# Patient Record
Sex: Male | Born: 1935 | Race: Black or African American | Hispanic: No | Smoking: Former smoker
Health system: Southern US, Community
[De-identification: ages and names within clinical notes are randomized; demographics above are authoritative.]

## PROBLEM LIST (undated history)

## (undated) DIAGNOSIS — I441 Atrioventricular block, second degree: Secondary | ICD-10-CM

## (undated) DIAGNOSIS — E785 Hyperlipidemia, unspecified: Secondary | ICD-10-CM

## (undated) DIAGNOSIS — H409 Unspecified glaucoma: Secondary | ICD-10-CM

## (undated) DIAGNOSIS — I2510 Atherosclerotic heart disease of native coronary artery without angina pectoris: Secondary | ICD-10-CM

## (undated) DIAGNOSIS — G309 Alzheimer's disease, unspecified: Secondary | ICD-10-CM

## (undated) DIAGNOSIS — M1990 Unspecified osteoarthritis, unspecified site: Secondary | ICD-10-CM

## (undated) DIAGNOSIS — N401 Enlarged prostate with lower urinary tract symptoms: Secondary | ICD-10-CM

## (undated) DIAGNOSIS — Z950 Presence of cardiac pacemaker: Secondary | ICD-10-CM

## (undated) DIAGNOSIS — R55 Syncope and collapse: Secondary | ICD-10-CM

## (undated) DIAGNOSIS — F0280 Dementia in other diseases classified elsewhere without behavioral disturbance: Secondary | ICD-10-CM

## (undated) DIAGNOSIS — I10 Essential (primary) hypertension: Secondary | ICD-10-CM

## (undated) HISTORY — PX: BRAIN SURGERY: SHX531

## (undated) HISTORY — DX: Atherosclerotic heart disease of native coronary artery without angina pectoris: I25.10

## (undated) HISTORY — PX: CATARACT EXTRACTION W/ INTRAOCULAR LENS  IMPLANT, BILATERAL: SHX1307

## (undated) HISTORY — DX: Unspecified osteoarthritis, unspecified site: M19.90

## (undated) HISTORY — DX: Essential (primary) hypertension: I10

## (undated) HISTORY — DX: Benign prostatic hyperplasia with lower urinary tract symptoms: N40.1

## (undated) HISTORY — DX: Hyperlipidemia, unspecified: E78.5

## (undated) HISTORY — DX: Syncope and collapse: R55

## (undated) HISTORY — DX: Atrioventricular block, second degree: I44.1

---

## 1994-11-09 ENCOUNTER — Encounter: Payer: Self-pay | Admitting: Family Medicine

## 1999-08-26 ENCOUNTER — Ambulatory Visit (HOSPITAL_COMMUNITY): Admission: RE | Admit: 1999-08-26 | Discharge: 1999-08-26 | Payer: Self-pay

## 1999-08-26 ENCOUNTER — Emergency Department (HOSPITAL_COMMUNITY): Admission: EM | Admit: 1999-08-26 | Discharge: 1999-08-26 | Payer: Self-pay

## 2000-08-10 ENCOUNTER — Emergency Department (HOSPITAL_COMMUNITY): Admission: EM | Admit: 2000-08-10 | Discharge: 2000-08-11 | Payer: Self-pay | Admitting: Emergency Medicine

## 2000-08-11 ENCOUNTER — Encounter: Payer: Self-pay | Admitting: Emergency Medicine

## 2002-03-21 ENCOUNTER — Inpatient Hospital Stay (HOSPITAL_COMMUNITY): Admission: EM | Admit: 2002-03-21 | Discharge: 2002-03-24 | Payer: Self-pay | Admitting: *Deleted

## 2003-01-25 HISTORY — PX: CORONARY ANGIOPLASTY: SHX604

## 2003-02-13 ENCOUNTER — Ambulatory Visit (HOSPITAL_COMMUNITY): Admission: RE | Admit: 2003-02-13 | Discharge: 2003-02-14 | Payer: Self-pay | Admitting: Cardiology

## 2003-02-23 HISTORY — PX: ANTERIOR CERVICAL DECOMP/DISCECTOMY FUSION: SHX1161

## 2003-02-26 ENCOUNTER — Emergency Department (HOSPITAL_COMMUNITY): Admission: EM | Admit: 2003-02-26 | Discharge: 2003-02-26 | Payer: Self-pay | Admitting: Emergency Medicine

## 2003-02-26 ENCOUNTER — Encounter: Admission: RE | Admit: 2003-02-26 | Discharge: 2003-02-26 | Payer: Self-pay | Admitting: Family Medicine

## 2003-02-26 ENCOUNTER — Encounter: Payer: Self-pay | Admitting: Family Medicine

## 2003-03-02 ENCOUNTER — Encounter: Admission: RE | Admit: 2003-03-02 | Discharge: 2003-03-02 | Payer: Self-pay | Admitting: Family Medicine

## 2003-03-02 ENCOUNTER — Encounter: Payer: Self-pay | Admitting: Family Medicine

## 2003-03-18 ENCOUNTER — Encounter: Payer: Self-pay | Admitting: Neurosurgery

## 2003-03-20 ENCOUNTER — Inpatient Hospital Stay (HOSPITAL_COMMUNITY): Admission: RE | Admit: 2003-03-20 | Discharge: 2003-03-21 | Payer: Self-pay | Admitting: Neurosurgery

## 2003-03-20 ENCOUNTER — Encounter: Payer: Self-pay | Admitting: Neurosurgery

## 2003-08-22 DIAGNOSIS — I2510 Atherosclerotic heart disease of native coronary artery without angina pectoris: Secondary | ICD-10-CM | POA: Insufficient documentation

## 2004-03-25 HISTORY — PX: INGUINAL HERNIA REPAIR: SUR1180

## 2004-03-31 ENCOUNTER — Encounter: Admission: RE | Admit: 2004-03-31 | Discharge: 2004-03-31 | Payer: Self-pay | Admitting: General Surgery

## 2004-04-04 ENCOUNTER — Ambulatory Visit (HOSPITAL_COMMUNITY): Admission: RE | Admit: 2004-04-04 | Discharge: 2004-04-04 | Payer: Self-pay | Admitting: General Surgery

## 2004-04-04 ENCOUNTER — Ambulatory Visit (HOSPITAL_BASED_OUTPATIENT_CLINIC_OR_DEPARTMENT_OTHER): Admission: RE | Admit: 2004-04-04 | Discharge: 2004-04-04 | Payer: Self-pay | Admitting: General Surgery

## 2004-12-25 LAB — HM COLONOSCOPY: HM Colonoscopy: NORMAL

## 2005-10-10 ENCOUNTER — Ambulatory Visit (HOSPITAL_COMMUNITY): Admission: RE | Admit: 2005-10-10 | Discharge: 2005-10-10 | Payer: Self-pay | Admitting: Gastroenterology

## 2005-10-10 ENCOUNTER — Encounter: Payer: Self-pay | Admitting: Family Medicine

## 2005-12-18 ENCOUNTER — Emergency Department (HOSPITAL_COMMUNITY): Admission: EM | Admit: 2005-12-18 | Discharge: 2005-12-18 | Payer: Self-pay | Admitting: Emergency Medicine

## 2007-03-18 ENCOUNTER — Encounter: Admission: RE | Admit: 2007-03-18 | Discharge: 2007-03-18 | Payer: Self-pay | Admitting: Family Medicine

## 2008-03-25 HISTORY — PX: PROSTATE BIOPSY: SHX241

## 2008-08-04 ENCOUNTER — Encounter: Payer: Self-pay | Admitting: Family Medicine

## 2008-08-04 HISTORY — PX: US ECHOCARDIOGRAPHY: HXRAD669

## 2008-08-04 HISTORY — PX: CARDIOVASCULAR STRESS TEST: SHX262

## 2009-02-11 ENCOUNTER — Encounter: Payer: Self-pay | Admitting: Family Medicine

## 2010-01-28 ENCOUNTER — Ambulatory Visit: Payer: Self-pay | Admitting: Family Medicine

## 2010-01-28 DIAGNOSIS — E785 Hyperlipidemia, unspecified: Secondary | ICD-10-CM

## 2010-01-28 DIAGNOSIS — I10 Essential (primary) hypertension: Secondary | ICD-10-CM

## 2010-01-28 DIAGNOSIS — N400 Enlarged prostate without lower urinary tract symptoms: Secondary | ICD-10-CM

## 2010-01-28 DIAGNOSIS — N138 Other obstructive and reflux uropathy: Secondary | ICD-10-CM

## 2010-01-28 DIAGNOSIS — N401 Enlarged prostate with lower urinary tract symptoms: Secondary | ICD-10-CM

## 2010-01-28 HISTORY — DX: Hyperlipidemia, unspecified: E78.5

## 2010-01-28 HISTORY — DX: Benign prostatic hyperplasia without lower urinary tract symptoms: N40.0

## 2010-01-28 HISTORY — DX: Other obstructive and reflux uropathy: N13.8

## 2010-01-28 HISTORY — DX: Benign prostatic hyperplasia with lower urinary tract symptoms: N40.1

## 2010-01-28 HISTORY — DX: Essential (primary) hypertension: I10

## 2010-01-28 LAB — CONVERTED CEMR LAB
Cholesterol, target level: 200 mg/dL
HDL goal, serum: 40 mg/dL
LDL Goal: 130 mg/dL

## 2010-01-31 LAB — CONVERTED CEMR LAB
ALT: 13 units/L (ref 0–53)
AST: 13 units/L (ref 0–37)
Albumin: 4 g/dL (ref 3.5–5.2)
Alkaline Phosphatase: 60 units/L (ref 39–117)
BUN: 15 mg/dL (ref 6–23)
Bilirubin, Direct: 0.1 mg/dL (ref 0.0–0.3)
CO2: 32 meq/L (ref 19–32)
Calcium: 9.1 mg/dL (ref 8.4–10.5)
Chloride: 105 meq/L (ref 96–112)
Cholesterol: 115 mg/dL (ref 0–200)
Creatinine, Ser: 1.3 mg/dL (ref 0.4–1.5)
GFR calc non Af Amer: 67.81 mL/min (ref >60)
Glucose, Bld: 98 mg/dL (ref 70–99)
HDL: 45.2 mg/dL (ref >39.00)
LDL Cholesterol: 59 mg/dL (ref 0–99)
Potassium: 4.3 meq/L (ref 3.5–5.1)
Sodium: 142 meq/L (ref 135–145)
Total Bilirubin: 1 mg/dL (ref 0.3–1.2)
Total CHOL/HDL Ratio: 3
Total Protein: 7.2 g/dL (ref 6.0–8.3)
Triglycerides: 52 mg/dL (ref 0.0–149.0)
VLDL: 10.4 mg/dL (ref 0.0–40.0)

## 2011-01-12 ENCOUNTER — Ambulatory Visit
Admission: RE | Admit: 2011-01-12 | Discharge: 2011-01-12 | Payer: Self-pay | Source: Home / Self Care | Attending: Family Medicine | Admitting: Family Medicine

## 2011-01-26 NOTE — Procedures (Signed)
Summary: Colonoscopy Report/[facility]  Colonoscopy Report/[facility]   Imported By: Maryln Gottron 02/01/2010 13:30:01  _____________________________________________________________________  External Attachment:    Type:   Image     Comment:   External Document

## 2011-01-26 NOTE — Assessment & Plan Note (Signed)
Summary: fu on htn/njr   Vital Signs:  Patient profile:   77 year old male Weight:      168 pounds Temp:     97.8 degrees F oral BP sitting:   118 / 70  (left arm) Cuff size:   regular  Vitals Entered By: Duard Brady LPN (January 12, 2011 9:52 AM) CC: f/u on BP - doing well, Hypertension Management, Lipid Management Is Patient Diabetic? No   History of Present Illness: Patient here for followup medical problems as below  Hypertension treated with benazepril and HCTZ. Patient compliant with therapy. No dizziness or headaches. No chest pain.  Hyperlipidemia treated with Vytorin. Cost issues. Will like to try generic. Previous intolerance to Lipitor with muscle aches. Lipids have been well controlled.  History of BPH. Symptoms controlled with doxazosin. No orthostasis. Good urine flow.  Hypertension History:      He denies headache, chest pain, palpitations, dyspnea with exertion, orthopnea, peripheral edema, visual symptoms, neurologic problems, syncope, and side effects from treatment.  He notes no problems with any antihypertensive medication side effects.        Positive major cardiovascular risk factors include male age 61 years old or older, hyperlipidemia, and hypertension.  Negative major cardiovascular risk factors include no history of diabetes, negative family history for ischemic heart disease, and non-tobacco-user status.        Positive history for target organ damage include ASHD (either angina/prior MI/prior CABG).  Further assessment for target organ damage reveals no history of stroke/TIA or peripheral vascular disease.    Lipid Management History:      Positive NCEP/ATP III risk factors include male age 23 years old or older, hypertension, and ASHD (either angina/prior MI/prior CABG).  Negative NCEP/ATP III risk factors include non-diabetic, no family history for ischemic heart disease, non-tobacco-user status, no prior stroke/TIA, no peripheral vascular disease,  and no history of aortic aneurysm.      Allergies (verified): No Known Drug Allergies  Past History:  Past Medical History: Last updated: 01/28/2010 arthritis glaucoma Hyperlipidemia Hypertension CAD, angioplasty 2004  Past Surgical History: Last updated: 01/28/2010 Angioplasty 2004 Cervical disk surgery, 2004 Hernia repair, 2006 prostate biopsy, 2009  Family History: Last updated: 01/28/2010 Heart disease, father deceased age 62, mother deceased age 63  Social History: Last updated: 01/28/2010 Retired Married Alcohol use past smoker, quit 1980  Risk Factors: Smoking Status: quit (01/28/2010) Packs/Day: 2.0 (01/28/2010) PMH-FH-SH reviewed for relevance  Review of Systems  The patient denies anorexia, fever, weight loss, weight gain, vision loss, decreased hearing, hoarseness, chest pain, syncope, dyspnea on exertion, peripheral edema, prolonged cough, headaches, hemoptysis, abdominal pain, melena, hematochezia, severe indigestion/heartburn, hematuria, incontinence, muscle weakness, transient blindness, difficulty walking, depression, unusual weight change, enlarged lymph nodes, and testicular masses.    Physical Exam  General:  Well-developed,well-nourished,in no acute distress; alert,appropriate and cooperative throughout examination Head:  normocephalic and atraumatic.   Ears:  External ear exam shows no significant lesions or deformities.  Otoscopic examination reveals clear canals, tympanic membranes are intact bilaterally without bulging, retraction, inflammation or discharge. Hearing is grossly normal bilaterally. Mouth:  Oral mucosa and oropharynx without lesions or exudates.  Teeth in good repair. Neck:  No deformities, masses, or tenderness noted. Lungs:  Normal respiratory effort, chest expands symmetrically. Lungs are clear to auscultation, no crackles or wheezes. Heart:  normal rate and regular rhythm.   Abdomen:  soft and non-tender.   Extremities:   No clubbing, cyanosis, edema, or deformity noted with normal full range  of motion of all joints.   Neurologic:  alert & oriented X3 and cranial nerves II-XII intact.     Impression & Recommendations:  Problem # 1:  HYPERLIPIDEMIA (ICD-272.4) change to generic simvastatin 20 mg daily and reassess lipids in 3 months His updated medication list for this problem includes:    Simvastatin 20 Mg Tabs (Simvastatin) ..... One by mouth q hs  Problem # 2:  HYPERTENSION (ICD-401.9)  His updated medication list for this problem includes:    Hydrochlorothiazide 12.5 Mg Tabs (Hydrochlorothiazide) ..... Once daily    Doxazosin Mesylate 4 Mg Tabs (Doxazosin mesylate) ..... Once daily    Benazepril Hcl 20 Mg Tabs (Benazepril hcl) ..... Once daily  Problem # 3:  HYPERTROPHY PROSTATE W/UR OBST & OTH LUTS (ICD-600.01)  Complete Medication List: 1)  Aspirin 325 Mg Tabs (Aspirin) .... Once daily 2)  Hydrochlorothiazide 12.5 Mg Tabs (Hydrochlorothiazide) .... Once daily 3)  Doxazosin Mesylate 4 Mg Tabs (Doxazosin mesylate) .... Once daily 4)  Benazepril Hcl 20 Mg Tabs (Benazepril hcl) .... Once daily 5)  Simvastatin 20 Mg Tabs (Simvastatin) .... One by mouth q hs  Hypertension Assessment/Plan:      The patient's hypertensive risk group is category C: Target organ damage and/or diabetes.  Today's blood pressure is 118/70.    Lipid Assessment/Plan:      Based on NCEP/ATP III, the patient's risk factor category is "history of coronary disease, peripheral vascular disease, cerebrovascular disease, or aortic aneurysm".  The patient's lipid goals are as follows: Total cholesterol goal is 200; LDL cholesterol goal is 130; HDL cholesterol goal is 40; Triglyceride goal is 150.    Patient Instructions: 1)  Please schedule a follow-up appointment in 3 months .  2)  BMP prior to visit, ICD-9: 401.9 3)  Hepatic Panel prior to visit ICD-9: 272.4 4)  Lipid panel prior to visit ICD-9 :  272.4 Prescriptions: SIMVASTATIN 20 MG TABS (SIMVASTATIN) one by mouth q hs  #90 x 3   Entered and Authorized by:   Evelena Peat MD   Signed by:   Evelena Peat MD on 01/12/2011   Method used:   Electronically to        [facility]  [phone]* (retail)       [address] [place], [place]  [address]       Ph: [phone] or [phone]       Fax: [phone]   RxID:   [id number] BENAZEPRIL HCL 20 MG TABS (BENAZEPRIL HCL) once daily  #90 x 3   Entered and Authorized by:   Evelena Peat MD   Signed by:   Evelena Peat MD on 01/12/2011   Method used:   Electronically to        [facility]  [phone]* (retail)       [address] [place], [place]  [address]       Ph: [phone] or [phone]       Fax: [phone]   RxID:   [id number] DOXAZOSIN MESYLATE 4 MG TABS (DOXAZOSIN MESYLATE) once daily  #90 x 3   Entered and Authorized by:   Evelena Peat MD   Signed by:   Evelena Peat MD on 01/12/2011   Method used:   [facility]  [address]  [phone]* (retail)       [address] [place],  [place]  [address]       Ph: [phone] or [phone]       Fax: [phone]   RxID:   [id number] HYDROCHLOROTHIAZIDE 12.5 MG TABS (HYDROCHLOROTHIAZIDE) once daily  #90 x 3   Entered and Authorized by:   Evelena Peat MD   Signed by:   Evelena Peat MD on 01/12/2011   Method used:   [facility]ronically to        [facility]  [phone]* (retail)       [address] [place], [place]  [address]       Ph: [phone] or [phone]       Fax: [phone]   RxID:   [id number]    Orders Added: 1)  Est. Patient Level IV [[address]]

## 2011-01-26 NOTE — Assessment & Plan Note (Signed)
Summary: NEW PT/TRAN[facility] [place]/[facility]   Vital Signs:  Patient profile:   77 year old male Height:      64.25 inches Weight:      165 pounds BMI:     28.20 Temp:     97.7 degrees F oral Pulse rate:   72 / minute Pulse rhythm:   regular Resp:     12 per minute BP sitting:   140 / 70  (left arm) Cuff size:   regular  Vitals Entered By: Sid Falcon LPN (January 28, 2010 9:12 AM) CC: New to establish, pt fasting, med refills, Hypertension Management, Lipid Management Is Patient Diabetic? No   History of Present Illness: New patient to establish care.  Past medical history assessment for coronary artery disease, hypertension, hyperlipidemia, and BPH. Prior elevated PSA with negative biopsy 2009.  Denies any significant nocturia or obstructive urinary symptoms.Angioplasty 2004. No recent chest pains.  Medications reviewed. Compliant with all medications. Denies side effects. No lab work in the past year.  Recent osteoarthritis pains left knee. Saw orthopedist and received corticosteroid injection which has helped some.  Hypertension History:      He denies headache, chest pain, palpitations, dyspnea with exertion, orthopnea, PND, peripheral edema, visual symptoms, syncope, and side effects from treatment.        Positive major cardiovascular risk factors include male age 2 years old or older, hyperlipidemia, and hypertension.  Negative major cardiovascular risk factors include no history of diabetes, negative family history for ischemic heart disease, and non-tobacco-user status.        Positive history for target organ damage include ASHD (either angina/prior MI/prior CABG).  Further assessment for target organ damage reveals no history of stroke/TIA or peripheral vascular disease.    Lipid Management History:      Positive NCEP/ATP III risk factors include male age 70 years old or older, hypertension, and ASHD (either angina/prior MI/prior CABG).  Negative NCEP/ATP III risk  factors include non-diabetic, no family history for ischemic heart disease, non-tobacco-user status, no prior stroke/TIA, no peripheral vascular disease, and no history of aortic aneurysm.      Preventive Screening-Counseling & Management  Alcohol-Tobacco     Smoking Status: quit     Packs/Day: 2.0     Year Started: 1940     Year Quit: 1980  Allergies (verified): No Known Drug Allergies  Past History:  Family History: Last updated: 01/28/2010 Heart disease, father deceased age 49, mother deceased age 66  Social History: Last updated: 01/28/2010 Retired Married Alcohol use past smoker, quit 1980  Risk Factors: Smoking Status: quit (01/28/2010) Packs/Day: 2.0 (01/28/2010)  Past Medical History: arthritis glaucoma Hyperlipidemia Hypertension CAD, angioplasty 2004  Past Surgical History: Angioplasty 2004 Cervical disk surgery, 2004 Hernia repair, 2006 prostate biopsy, 2009 PMH-FH-SH reviewed for relevance  Family History: Heart disease, father deceased age 85, mother deceased age 41  Social History: Retired Married Alcohol use past smoker, quit 1980 Smoking Status:  quit Packs/Day:  2.0  Review of Systems  The patient denies anorexia, fever, weight loss, weight gain, chest pain, syncope, dyspnea on exertion, peripheral edema, prolonged cough, headaches, hemoptysis, abdominal pain, melena, hematochezia, severe indigestion/heartburn, incontinence, muscle weakness, and depression.    Physical Exam  General:  Well-developed,well-nourished,in no acute distress; alert,appropriate and cooperative throughout examination Ears:  External ear exam shows no significant lesions or deformities.  Otoscopic examination reveals clear canals, tympanic membranes are intact bilaterally without bulging, retraction, inflammation or discharge. Hearing is grossly normal bilaterally. Mouth:  Oral  mucosa and oropharynx without lesions or exudates.  Teeth in good repair. Neck:  No  deformities, masses, or tenderness noted. Lungs:  Normal respiratory effort, chest expands symmetrically. Lungs are clear to auscultation, no crackles or wheezes. Heart:  normal rate, regular rhythm, and no gallop.   Abdomen:  soft, non-tender, normal bowel sounds, no distention, no masses, no hepatomegaly, and no splenomegaly.   Extremities:  moderate crepitus left knee with flexion and extension. No effusion Neurologic:  alert & oriented X3, cranial nerves II-XII intact, strength normal in all extremities, and gait normal.   Psych:  good eye contact, not anxious appearing, and not depressed appearing.     Impression & Recommendations:  Problem # 1:  HYPERTENSION (ICD-401.9)  His updated medication list for this problem includes:    Hydrochlorothiazide 12.5 Mg Tabs (Hydrochlorothiazide) ..... Once daily    Doxazosin Mesylate 4 Mg Tabs (Doxazosin mesylate) ..... Once daily    Benazepril Hcl 20 Mg Tabs (Benazepril hcl) ..... Once daily  Orders: TLB-BMP (Basic Metabolic Panel-BMET) (80048-METABOL)  Problem # 2:  HYPERLIPIDEMIA (ICD-272.4)  His updated medication list for this problem includes:    Vytorin 10-20 Mg Tabs (Ezetimibe-simvastatin) ..... Once daily  Orders: Venipuncture ([address]) TLB-Lipid Panel (80061-LIPID) TLB-Hepatic/Liver Function Pnl (80076-HEPATIC)  Problem # 3:  HYPERTROPHY PROSTATE W/UR OBST & OTH LUTS (ICD-600.01)  Complete Medication List: 1)  Aspirin 325 Mg Tabs (Aspirin) .... Once daily 2)  Hydrochlorothiazide 12.5 Mg Tabs (Hydrochlorothiazide) .... Once daily 3)  Doxazosin Mesylate 4 Mg Tabs (Doxazosin mesylate) .... Once daily 4)  Benazepril Hcl 20 Mg Tabs (Benazepril hcl) .... Once daily 5)  Vytorin 10-20 Mg Tabs (Ezetimibe-simvastatin) .... Once daily  Hypertension Assessment/Plan:      The patient's hypertensive risk group is category C: Target organ damage and/or diabetes.  Today's blood pressure is 140/70.    Lipid Assessment/Plan:      Based on  NCEP/ATP III, the patient's risk factor category is "history of coronary disease, peripheral vascular disease, cerebrovascular disease, or aortic aneurysm".  The patient's lipid goals are as follows: Total cholesterol goal is 200; LDL cholesterol goal is 130; HDL cholesterol goal is 40; Triglyceride goal is 150.    Patient Instructions: 1)  Please schedule a follow-up appointment in 1 year.  Prescriptions: VYTORIN 10-20 MG TABS (EZETIMIBE-SIMVASTATIN) once daily  #30 x 11   Entered and Authorized by:   Evelena Peat MD   Signed by:   Evelena Peat MD on 01/28/2010   Method used:   Electronically to        [facility]. [phone]* (retail)       [phone] [facility]       [place], [place]  [address]       Ph: [phone]       Fax: [phone]   RxID:   [id number] BENAZEPRIL HCL 20 MG TABS (BENAZEPRIL HCL) once daily  #30 x 11   Entered and Authorized by:   Evelena Peat MD   Signed by:   Evelena Peat MD on 01/28/2010   Method used:   Electronically to        [facility]. [phone]* (retail)       [phone] [facility]       [place], [place]  [address]       Ph: [phone]       Fax: [phone]   RxID:  [id number] DOXAZOSIN MESYLATE 4 MG TABS (DOXAZOSIN MESYLATE) once daily  #30 x 11   Entered and Authorized by:   Evelena Peat MD   Signed by:   Evelena Peat MD on 01/28/2010   Method used:   Electronically to        [facility]. [phone]* (retail)       [phone] [facility].       [place], [place]  [address]       Ph: [phone]       Fax: [phone]   RxID:   [id number] HYDROCHLOROTHIAZIDE 12.5 MG TABS (HYDROCHLOROTHIAZIDE) once daily  #30 x 11   Entered and Authorized by:   Evelena Peat MD   Signed by:   Evelena Peat MD on 01/28/2010   Method used:   Electronically to        [facility] [phone]* (retail)       [phone] [facility].        [place], [place]  [address]       Ph: [phone]       Fax: [phone]   RxID:   [id number]   Preventive Care Screening  Colonoscopy:    Date:  12/25/2004    Results:  normal

## 2011-01-26 NOTE — Letter (Signed)
Summary: [facility]   Imported By: Maryln Gottron 02/01/2010 13:23:21  _____________________________________________________________________  External Attachment:    Type:   Image     Comment:   External Document

## 2011-04-06 ENCOUNTER — Other Ambulatory Visit (INDEPENDENT_AMBULATORY_CARE_PROVIDER_SITE_OTHER): Payer: Medicare Other | Admitting: Family Medicine

## 2011-04-06 DIAGNOSIS — I10 Essential (primary) hypertension: Secondary | ICD-10-CM

## 2011-04-06 DIAGNOSIS — E785 Hyperlipidemia, unspecified: Secondary | ICD-10-CM

## 2011-04-06 LAB — LIPID PANEL
Cholesterol: 149 mg/dL (ref 0–200)
HDL: 41.7 mg/dL (ref >39.00)
LDL Cholesterol: 96 mg/dL (ref 0–99)
Total CHOL/HDL Ratio: 4
Triglycerides: 59 mg/dL (ref 0.0–149.0)
VLDL: 11.8 mg/dL (ref 0.0–40.0)

## 2011-04-06 LAB — BASIC METABOLIC PANEL
Chloride: 102 mEq/L (ref 96–112)
Creatinine, Ser: 1.3 mg/dL (ref 0.4–1.5)
GFR: 65.86 mL/min (ref >60.00)

## 2011-04-06 LAB — HEPATIC FUNCTION PANEL
ALT: 10 U/L (ref 0–53)
Bilirubin, Direct: 0.2 mg/dL (ref 0.0–0.3)
Total Bilirubin: 0.7 mg/dL (ref 0.3–1.2)

## 2011-04-07 NOTE — Progress Notes (Signed)
Pt aware.

## 2011-04-12 ENCOUNTER — Encounter: Payer: Self-pay | Admitting: Family Medicine

## 2011-04-13 ENCOUNTER — Ambulatory Visit (INDEPENDENT_AMBULATORY_CARE_PROVIDER_SITE_OTHER): Payer: Medicare Other | Admitting: Family Medicine

## 2011-04-13 ENCOUNTER — Encounter: Payer: Self-pay | Admitting: Family Medicine

## 2011-04-13 DIAGNOSIS — E785 Hyperlipidemia, unspecified: Secondary | ICD-10-CM

## 2011-04-13 DIAGNOSIS — N401 Enlarged prostate with lower urinary tract symptoms: Secondary | ICD-10-CM

## 2011-04-13 DIAGNOSIS — I10 Essential (primary) hypertension: Secondary | ICD-10-CM

## 2011-04-13 NOTE — Progress Notes (Signed)
  Subjective:    Patient ID: Joshua Burns, male    DOB: 1925-02-21, 77 y.o.   MRN: [id number]  HPI Patient seen for medical followup. Recent labs reviewed. He has hyperlipidemia. We switched from Vytorin to simvastatin. Tolerating well.  History of CAD. He has no side effects from medication. Compliant with all medications.  Hypertension treated with benazepril. No orthostasis. No cough. Blood pressure is well-controlled.  History of BPH.  Takes Cardura. Occasional nocturia. No progressive obstructive symptoms. No orthostatic symptoms with Cardura.  Past Medical History  Diagnosis Date  . HYPERLIPIDEMIA 01/28/2010  . HYPERTENSION 01/28/2010  . HYPERTROPHY PROSTATE W/UR OBST & OTH LUTS 01/28/2010  . CAD (coronary artery disease)   . Arthritis   . Glaucoma    Past Surgical History  Procedure Date  . Angioplasty 2004  . Cervical disc surgery 2004  . Hernia repair 2006  . Prostate surgery 2009    bx    reports that he quit smoking about 32 years ago. He does not have any smokeless tobacco history on file. He reports that he drinks alcohol. His drug history not on file. family history includes Heart disease in his father and mother. No Known Allergies    Review of Systems  Constitutional: Negative for fatigue.  Eyes: Negative for visual disturbance.  Respiratory: Negative for cough, chest tightness and shortness of breath.   Cardiovascular: Negative for chest pain, palpitations and leg swelling.  Neurological: Negative for dizziness, syncope, weakness, light-headedness and headaches.       Objective:   Physical Exam  Constitutional: He is oriented to person, place, and time. He appears well-developed and well-nourished. No distress.  HENT:  Head: Normocephalic and atraumatic.  Right Ear: External ear normal.  Left Ear: External ear normal.  Mouth/Throat: Oropharynx is clear and moist.  Eyes: Conjunctivae and EOM are normal. Pupils are equal, round, and reactive to light.    Neck: Normal range of motion. Neck supple. No thyromegaly present.  Cardiovascular: Normal rate, regular rhythm and normal heart sounds.   No murmur heard. Pulmonary/Chest: No respiratory distress. He has no wheezes. He has no rales.  Abdominal: Soft. Bowel sounds are normal. He exhibits no distension and no mass. There is no tenderness. There is no rebound and no guarding.  Musculoskeletal: He exhibits no edema.  Lymphadenopathy:    He has no cervical adenopathy.  Neurological: He is alert and oriented to person, place, and time. He displays normal reflexes. No cranial nerve deficit.  Skin: No rash noted.  Psychiatric: He has a normal mood and affect.          Assessment & Plan:  #1 hypertension stable. Continue current medication. Recent electrolytes and creatinine stable #2 hyperlipidemia. Lipids are due to his patient. At goal. Continue Zocor 20 mg daily #3 BPH. Symptomatically stable. Continue Cardura 4 mg each bedtime which he has been on for several years

## 2011-05-12 NOTE — Discharge Summary (Signed)
[facility]  Patient:    Joshua Burns, Joshua Burns Visit Number: [id number] MRN: [id number]          Service Type: MED Location: 3W [id number] Attending Physician:  Joshua Burns Dictated by:   Joshua Burns, M.D. Admit Date:  03/21/2002 Discharge Date: 03/24/2002   CC:         Joshua Burns. Joshua Burns, M.D.   Discharge Summary  DATE OF BIRTH:  [birth year]  DISCHARGE DIAGNOSES:  1. Syncopal episode.     a. Suspect dehydration with orthostasis and possibly vasovagal with        nausea.  2. Viral bronchitis.  3. Mild thrombocytopenia.     a. Admission platelet count 126, discharge 142.  4. Mildly elevated sedimentation rate at 37.     a. Significance unclear.  5. Greater than 20% bands on initial peripheral blood smear.     a. Afebrile.     b. Unable to obtain repeat, technical difficulties with the laboratory        coordination.  6. Dehydration/hypokalemia, resolved.     a. Recent viral illness.     b. Started on hydrochlorothiazide one week ago.  7. Variable blood pressure with a history of hypertension.     a. Recently added hydrochlorothiazide.     b. Decrease medications at the time of discharge.  8. Chronic back pain secondary to degenerative disc disease.     a. MRI in 9/00.  9. Hypercholesterolemia. 10. Hyperglycemia on admission.     a. Fasting CBG followup was 103. 11. Recent history of muscle weakness and fatigue.     a. Zocor stopped one week prior to admission.  DISCHARGE MEDICATIONS: 1. (New) Lotensin 5 mg q.a.m. 2. (New) Combivent metered dose inhaler with spacer 2 puffs q.i.d. 3. (New) Humibid DM one p.o. b.i.d. x2 weeks.  * Do not take Lotrel, hydrochlorothiazide, K-Dur, Cardura.  CONDITION ON DISCHARGE:  Stable.  Blood pressure 123/64, heart rate 61.  DISPOSITION:  Home.  RECOMMENDED ACTIVITY:  As tolerated.  RECOMMENDED DIET:  As tolerated.  DISCHARGE INSTRUCTIONS:  Call or return if you have problems.  FOLLOWUPDerl Burns, Dr. Caryl Burns, Thursday, March 27, 2002, at 11 a.m.  Dr. Arlyce Burns will be out of town.  At that visit they should check a basic metabolic panel, CBC with differential and smear, blood pressure, and followup 2-D echocardiogram results on the day of discharge.  PROCEDURES: 1. Head CT (03/21/02):  Mild atrophy, no acute disease. 2. Chest x-ray (03/21/02):  Peribronchial thickening is stable.  Little    interval change since x-ray on August 11, 2000.  Mild pulmonary vascular    congestion ??. 3. MRI/MRA head (03/23/02):  No acute infarct.  Mild paranasal sinus disease.    ______ minimally prominent in size.  Atrophy.  MRA shows mild intracranial    atherosclerotic type changes. 4. Carotid Dopplers:  No evidence of significant plaque bilaterally. 5. A 2-D echocardiogram pending at the time of discharge.  HOSPITAL COURSE:  #1 - SYNCOPE:  [name] is a 77 year old gentleman with a history of high cholesterol and hypertension who presents with syncope.  He had been feeling poorly for the past week with a cough and possibly a viral syndrome.  About one week ago he was in the office and Dr. Arlyce Burns added hydrochlorothiazide to his medications.  On the morning of the episode he was sitting at the table and developed some nausea.  He went to sit down on the couch.  He started to feel somewhat light-headed and his vision closed in on him, and his wife witnessed him passing out.  There was no witnessed seizure activity. Unfortunately, there were no EMS records placed in the chart, so I have no idea what his blood pressure was at the time EMS arrived.  In the emergency room, his systolic blood pressure was around 100.  Orthostatics were not done on admission either.  Overnight he received IV fluids.  The next morning he was feeling fine with no symptoms.  Later that afternoon he did develop some presyncopal type symptoms.  His blood pressure was 130 at that time.  There was no abnormality on  the telemetry.  He then told me he had experienced double vision a couple weeks earlier.  I ordered an MRI/MRA which showed no significant blockages and no evidence of acute stroke.  On Monday morning we were able to obtain carotid Dopplers which showed no evidence of significant stenosis.  A 2-D echocardiogram was done, but I feel it is safe to let him go home.  My guess is that he was volume depleted secondary to the recent administration of a diuretic, as well as the recent viral syndrome and bronchitis.  He will follow up with his regular physicians this week.  #2 - PROBABLE BRONCHITIS AND VIRAL SYNDROME:  I do not see the final report of the MRI indicating possible sinus disease.  This may be contributing to his cough.  Recommend evaluation as an outpatient, possible starting of Nasonex and antibiotics if he is still experiencing symptoms.  I did send him out on a Combivent metered dose inhaler.  #3 - MILDLY ELEVATED SEDIMENTATION RATE:  This was 37.  I got this because he had described some proximal weakness with mowing the lawn.  There was no associated chest pain, shortness of breath, or light-headedness.  This occurred about 1-1/2 weeks ago.  His regular doctor had stopped his Zocor.  He has no temporal headaches or unilateral vision changes or temporal artery tenderness.  With the sedimentation rate of 37, I doubt that he has symptomatology of polymyalgia rheumatica.  #4 - ABNORMAL BLOOD SMEAR:  Interestingly, his initial white blood cell count was 5400.  Ultimately, the peripheral smear came back showing greater than 20% bands.  I suspect this was an error because he did not even have a left shift on his differential.  I tried to repeat it on March 23, 2002, but the lab somehow got the signals crossed and did not do my peripheral smear.  Again, the white count was 5900 with a normal differential.  By the time I was able to track this down it was the following day, and they  were unable to do the  test.  I did not feel it was worthwhile to stick the patient again to obtain this at this time.  He had no fevers and no hypotension or any other indication of sepsis.  #5 - DEHYDRATION/HYPOKALEMIA:  Secondary to poor intake and diuretic.  We hydrated him when he got here and I stopped his diuretic.  #6 - HYPERTENSION:  Blood pressure has been variable.  On the morning of March 23, 2002, it was 111/63.  On the morning of March 24, 2002, it was 123/64.  I had started him on Lotensin 5 mg q.d.  He will continue to take this and follow up at [facility].  #7 - CARDIAC RISK FACTORS:  Mr. Masden does have a history of  hypertension and hyperlipidemia.  I did do a 2-D echocardiogram.  I have not placed him on an aspirin empirically, but this would be reasonable, and I would certainly need to follow up his 2-D echocardiogram report.  DISCHARGE LABORATORY DATA:  Sodium 137, potassium 3.9, chloride 101, bicarbonate 30, BUN 20, creatinine 1.3, glucose 111.  Hemoglobin 12.6, MCV 80, white blood cell count 5900, platelet count 142.  Sedimentation rate 37. Phosphorus 2.4.  Serial cardiac enzymes negative.  I spent greater than 30 minutes arranging discharge and dictating. Dictated by:   Joshua Burns, M.D. Attending Physician:  Joshua Burns DD:  03/24/02 TD:  03/25/02 Job: [id number]

## 2011-05-12 NOTE — Op Note (Signed)
NAMEKEWON, STATLER                           ACCOUNT NO.:  [id number]   MEDICAL RECORD NO.:  [id number]                   PATIENT TYPE:  AMB   LOCATION:  DSC                                  FACILITY:  [facility]   PHYSICIAN:  Leonie Man, M.D.                DATE OF BIRTH:  03/03/1925   DATE OF PROCEDURE:  04/04/2004  DATE OF DISCHARGE:                                 OPERATIVE REPORT   PREOPERATIVE DIAGNOSIS:  Right inguinal hernia.   POSTOPERATIVE DIAGNOSIS:  Right inguinal hernia.   OPERATION PERFORMED:  Repair, right inguinal hernia with mesh.   SURGEON:  Leonie Man, M.D.   ASSISTANT:  Nurse.   ANESTHESIA:  General.   INDICATIONS FOR PROCEDURE:  Mr. Macchia is a 77 year old retired gentleman  with a right-sided groin bulge.  This has been causing some mild discomfort.  He was referred for evaluation and treatment.  On examination, he was noted  to have a right-sided inguinal hernia.  The risks and potential benefits of  repair of a right inguinal hernia has been discussed with the patient.  All  questions answered and consent obtained.   DESCRIPTION OF PROCEDURE:  Following the induction of satisfactory general  anesthesia, the patient was positioned supinely.  The right groin was  prepped and draped to be included in the sterile operative field.  I  infiltrated in the region of the right groin crease with 0.5% Marcaine with  epinephrine 1:200,000.  A transverse incision was made in the lower groin  crease and deepened through the skin and subcutaneous tissue, dissecting  down to the external oblique aponeurosis.  This was opened up through the  external inguinal ring with retraction of the ilioinguinal nerve cephalad  and medially.  The spermatic cord was elevated and held with a Penrose  drain.  A large indirect hernia was noted to be securely attached to the  spermatic cord.  This was dissected free and reduced into the  retroperitoneum.  Spermatic cord explored.   A very large lipoma was removed.  There were no indirect hernias noted.  The direct space was then repaired  with a patch of polypropylene mesh sewn in at the pubic tubercle and  continued up along the shelving edge of Poupart's ligament to the internal  ring with a 2-0 Novofil suture and again from the pubic tubercle along the  conjoined tendon to the internal ring with another 2-0 Novofil suture.  The  mesh was split to allow the cord to emanate between them.  The leaves of the  mesh were then trimmed and then sewn down into the internal oblique muscles  behind the cord.  The cord was free with no vascular compromise.  Sponge,  instrument and sharp counts were then verified.  All areas of dissection  checked for hemostasis and noted to be dry.  Wound closed in layers as  follows:  External  oblique aponeurosis closed over the cord with a running 2-  0 Vicryl suture.  The Scarpa's fascia closed with a running 3-0 Vicryl  suture and the skin closed with a running 4-0 Monocryl suture placed  intradermally.  Sterile dressings were then applied after Steri-Strips had  been applied to the wound.  Anesthetic was reversed and the patient removed  from the operating room to the recovery room in stable condition, having  tolerated the procedure well.                                              Leonie Man, M.D.   PB/MEDQ  D:  04/04/2004  T:  04/04/2004  Job:  [id number]

## 2011-05-12 NOTE — Cardiovascular Report (Signed)
NAMEAMARU, Burns NO.:  [id number]   MEDICAL RECORD NO.:  [id number]                   PATIENT TYPE:  OIB   LOCATION:  2869                                 FACILITY:  [facility]   PHYSICIAN:  Peter M. Swaziland, M.D.               DATE OF BIRTH:  Aug 21, 1925   DATE OF PROCEDURE:  02/13/2003  DATE OF DISCHARGE:                              CARDIAC CATHETERIZATION   PROCEDURE:  1. Cardiac catheterization.  2. Angioplasty.   INDICATIONS FOR PROCEDURE:  The patient is a 77 year old black male with a  history of hypertension and hyperlipidemia who presents with symptoms of  atypical chest pain.  Exercise stress test is positive for ischemia.   Access via the right femoral using the standard Seldinger technique, with  equipment, 6-French 4 cm right and left Judkins catheters, 6-French pigtail  catheter, 6-French arterial sheath, 7-French arterial sheath, and 7-French  left Voda guide, 0.014 across an XT 200 wire, and a 2.5 x15 Mavik balloon.  Contrast 250 cc of Omnipaque contrast.   MEDICATIONS:  1. Heparin a total of 700 units.  ACT was 305 seconds at the start of the     procedure.  2. Nitroglycerin 200 micrograms intracoronary x1.   HEMODYNAMIC DATA:  Aortic pressure 148/63 with a mean of 97 mmHg.  Left ventricular pressure 150 with an EDP of 18 mmHg.   ANGIOGRAPHIC DATA:  The left coronary artery arises and distributes  normally.  The left main coronary is very large and long without significant  disease.   Left anterior descending artery has minor wall irregularities, less than  10%.  There is a large intermediate branch which has only minor  irregularities, less than 10%.   Left circumflex coronary artery has a focal 90% stenosis in the proximal  vessel.   The right coronary artery arises and distributes normally and is a dominant  vessel.  It has minor irregularities, less than 10%.   Left ventricular angiography is performed in the RAO  and LAO cranial views.  This demonstrates normal left ventricular size with minimal lateral wall  hypokinesia.  Overall left ventricular systolic function is normal with an  ejection fraction of 60%.  There is no mitral regurgitation or prolapse.  The aortic valve appears normal.   We proceeded with angioplasty of the left circumflex lesion.  This lesion  proved to be very difficult to cross due to acute angulation of the  circumflex from the left main coronary artery, followed by further  tortuosity in the proximal left circumflex.  We were initially unable to  cross with either a high-torque floppy wire or a Choice PT wire.  Using a  200 XT wire, we were able to cross the lesion.  We then brought down the 2.5  x15 mm Mavik balloon, and performed two dilatations of 6 and then 10  atmospheres.  This yielded excellent angiographic result  with less than 10%  residual stenosis.  There was TIMI grade 3 flow, and no complications.  It  was felt that this lesions would be very difficult to stent given the  difficulty crossing the lesion with a balloon alone.  Since we did have an  excellent balloon angioplasty result, the procedure was stopped at this  point.   FINAL INTERPRETATION:  1. Single vessel obstructive atherosclerotic coronary artery disease.  2. Normal left ventricular function.  3. Successful balloon angioplasty of the proximal left circumflex coronary     artery.                                               Peter M. Swaziland, M.D.    PMJ/MEDQ  D:  02/13/2003  T:  02/14/2003  Job:  [id number]   cc:   Teena Irani. Arlyce Dice, M.D.  [address]  [place]  [place] [address]  Fax: [phone]

## 2011-05-12 NOTE — Discharge Summary (Signed)
NAME:  GLEASON, ARDOIN NO.:  [id number]   MEDICAL RECORD NO.:  [id number]                   PATIENT TYPE:  OIB   LOCATION:  6523                                 FACILITY:  [facility]   PHYSICIAN:  Peter M. Swaziland, M.D.               DATE OF BIRTH:  1925-08-15   DATE OF ADMISSION:  02/13/2003  DATE OF DISCHARGE:  02/14/2003                                 DISCHARGE SUMMARY   HISTORY OF PRESENT ILLNESS:  The patient is a 77 year old black male who  presents with symptoms of chest pain.  He had an abnormal stress test.  He  has a history of hypertension and hypercholesterolemia.  For details of his  past medical history, social history, family history, and physical exam,  please see the admission history and physical.   LABORATORY DATA:  Sodium 141, potassium 3.9, chloride 103, CO2 26, BUN 16,  creatinine 1.5, glucose 104, triglycerides 95, cholesterol 190, LDL 119, HDL  52.  Coagulation times were normal.  The white count was 5400, hemoglobin  13.1, hematocrit 38.8, platelets 157,000.  The resting ECG was normal.   HOSPITAL COURSE:  The patient underwent cardiac catheterization.  This  demonstrated severe 95% stenosis in the proximal left circumflex coronary  artery.  This was a focal lesion.  There was no other significant  obstructive disease.  Left ventricular function was normal.  The patient  underwent angioplasty of his lesion.  It was difficult to approach the  lesion due to acute angulation of the left circumflex from the left main  coronary artery and tortuosity of the proximal vessel.  However, we were  able to successfully angioplasty this using a 2.5 x 15 mm Maverick balloon  and achieved an excellent result of less than 10% residual stenosis.  The  patient tolerated the procedure well.  Post procedure, he was treated with  IV nitroglycerin overnight and then this was discontinued.  He had no groin  complications.  His follow-up ECG was  normal.   DISPOSITION:  The patient was discharged home the following day in stable  condition.   DISCHARGE MEDICATIONS:  1. Zocor was increased to 80 mg per day.  2. HCTZ 12.5 mg daily.  3. Cardura 4 mg per day.  4. Lotensin 10 mg per day.  5. Aspirin 325 mg per day.   DISCHARGE DIAGNOSES:  1. Angina pectoris with coronary artery disease.  2. Hypertension.  3. Hypercholesterolemia.  4. Remote history of syncope.   FOLLOW-UP:  The patient will follow up with Peter M. Swaziland, M.D., in 7-10  days.    ACTIVITY:  Will advance activities as tolerated.   DISCHARGE STATUS:  Improved.  Peter M. [place]iland, M.D.    PMJ/MEDQ  D:  02/14/2003  T:  02/14/2003  Job:  [id number]   cc:   Teena Irani. Arlyce Dice, M.D.  [address]  [place]  [place] [address]  Fax: [phone]

## 2011-05-12 NOTE — H&P (Signed)
NAME:  Joshua Burns, Joshua Burns NO.:  [id number]   MEDICAL RECORD NO.:  [id number]                   PATIENT TYPE:  INP   LOCATION:                                       FACILITY:  [facility]   PHYSICIAN:  Peter M. Swaziland, M.D.               DATE OF BIRTH:  April 15, 1925   DATE OF ADMISSION:  02/13/2003  DATE OF DISCHARGE:                                HISTORY & PHYSICAL   HISTORY OF PRESENT ILLNESS:  Joshua Burns is a pleasant 77 year old black male  with a history of hypertension and hypercholesterolemia.  Over the past  month he has been experiencing pain in his left anterior chest.  This  typically occurs at night and is worse when he tries to turn over.  It has  occurred to a lower level during the day.  There is some discomfort when he  coughs.  He has been very active and enjoys playing golf.  The patient had  experienced a syncopal episode in April, 2003.  He had some type of cardiac  evaluation at that time which was reportedly normal.  He also had an MRI of  his head which was unremarkable.  He has had no recurrence of syncope since  then.  To further evaluate his chest pain the patient underwent exercise  stress testing on February 18.  This demonstrated evidence of inferolateral  ischemia with frequent ventricular ectopy.  Because of his abnormal stress  test and chest pain he is now admitted for cardiac catheterization.   PAST MEDICAL HISTORY:  Significant for hypertension and  hypercholesterolemia.  He has had previous cyst removals, he has had a  history of syncope with negative evaluation in April, 2003.   ALLERGIES:  No known allergies.   MEDICATIONS:  1. Zocor 40 mg per day.  2. Hydrochlorothiazide 12.5 mg daily.  3. Cardura 4 mg per day.  4. Aspirin daily.  5. Lodrin daily.   SOCIAL HISTORY:  The patient is retired from the [employer].  He quit  smoking 15 years ago.  Drinks an occasional beer.  He is married and has  three children.   FAMILY HISTORY:  Father died at age 60 myocardial infarction.  Mother died  at age 29 with congestive heart failure.  One brother died with an aneurysm.   REVIEW OF SYSTEMS:  No recent strain or injury, no orthopnea, PND or edema,  no history of TIA or stroke, no abdominal or back pain.  Other review of  systems are negative.   PHYSICAL EXAMINATION:  GENERAL:  The patient is a pleasant black male in no  apparent distress.  Weight is 171 pounds.  VITALS:  Blood pressure 150/80, pulse 60 and regular.  HEENT:  Pupils equal, round, reactive to light and accommodation.  Extraocular movements are full.  Oropharynx is clear.  NECK:  Without JVD, adenopathy, thyromegaly or bruits.  LUNGS:  Clear to auscultation and percussion.  CARDIAC:  Reveals regular rate and rhythm without gallops, murmurs, rubs or  clicks.  ABDOMEN:  Soft, nontender, without hepatosplenomegaly, masses or bruits.  EXTREMITIES:  Femoral pedal pulses 2+ and symmetric.  He has no edema.  NEUROLOGIC:  Nonfocal.   LABORATORY DATA:  ECG demonstrates sinus bradycardia with normal ECG.  Chest  x-ray shows no active disease.   IMPRESSION:  1. Atypical chest pain but with abnormal exercise stress test.  2. Hypertension.  3. Hypercholesterolemia.  4. Family history of early coronary disease.  5. Prior history of syncope.   PLAN:  The patient will be admitted for cardiac catheterization with further  therapy pending these results.                                               Peter M. Swaziland, M.D.    PMJ/MEDQ  D:  02/11/2003  T:  02/11/2003  Job:  [id number]

## 2011-05-12 NOTE — Op Note (Signed)
NAMEIMIR, BRUMBACH NO.:  [id number]   MEDICAL RECORD NO.:  [id number]                   PATIENT TYPE:  INP   LOCATION:  3010                                 FACILITY:  [facility]   PHYSICIAN:  Danae Orleans. Venetia Maxon, M.D.               DATE OF BIRTH:  August 13, 1925   DATE OF PROCEDURE:  03/20/2003  DATE OF DISCHARGE:  03/21/2003                                 OPERATIVE REPORT   PREOPERATIVE DIAGNOSIS:  Herniated cervical disk with cervical spondylosis,  degenerative disk disease, and cervical radiculopathy, C4-5, C5-6, and C6-7  levels.   POSTOPERATIVE DIAGNOSES:  Herniated cervical disk with cervical spondylosis,  degenerative disk disease, and cervical radiculopathy, C4-5, C5-6, and C6-7  levels.   PROCEDURE:  Anterior cervical decompression and fusion, C4-5, C5-6, and C6-7  levels, with allograft bone graft and anterior cervical plate.   SURGEON:  Danae Orleans. Venetia Maxon, M.D.   ASSISTANT:  Clydene Fake, M.D.   ANESTHESIA:  General endotracheal anesthesia.   ESTIMATED BLOOD LOSS:  100 mL.   COMPLICATIONS:  None.   DISPOSITION:  To recovery.   INDICATIONS:  The patient is a 77 year old man with a large disk herniation  at C6-7 on the left with significant triceps weakness and pain.  He has  biforaminal stenosis to a marked degree at C4-5 and C5-6 levels.  It was  elected to take him to surgery for anterior cervical decompression and  fusion at these affected levels.   DESCRIPTION OF PROCEDURE:  The patient was brought to the operating room.  Following the satisfactory and uncomplicated induction of general  endotracheal anesthesia and the placement of intravenous lines, the patient  was placed in the supine position on the operating table, his neck was  placed in slight extension, and he was placed in 10 pounds of Holter  traction.  His anterior neck was then prepped and draped in the usual  sterile fashion.  The area of planned incision was  infiltrated with 0.25%  Marcaine and 0.5% lidocaine and 1:200,000 epinephrine.  An incision was made  from the midline to the anterior border of the sternocleidomastoid muscle,  carried sharply to the platysmal layer.  Subplatysmal dissection was then  performed and then along the anterior border of the sternocleidomastoid  blunt dissection was performed, keeping the carotid sheath lateral and the  tracheal esophagus medial, exposing the anterior cervical spine.  A bent  spinal needle was placed over what was felt to be the C5-6 level, and this  actually was shown to be the C4-5 level, and this was confirmed by  intraoperative x-rays.  The longus colli muscles were taken down from the  anterior cervical spine from C4 to C7 levels bilaterally using  electrocautery and Key elevator.  A self-retaining Shadow Line retractor was  placed to facilitate exposure, including up-down retractor.  Subsequently  the interspace at C4-5, C5-6, and C6-7  levels were then incised with a 15  blade and disk material was removed in a piecemeal fashion.  The end plates  were then stripped of residual disk material using a variety of [employer].  A disk space spreader was placed, initially at the C6-7  level, and subsequently the microscope was brought into the field and using  microdissection technique and the [place]spach drill with the A2 equivalent bur,  the end plates of C5, C6, and C7 were decorticated and uncinate spurs were  drilled down.  The posterior longitudinal ligament was then incised and  removed in a piecemeal fashion using a variety of 2 mm gold-tipped Kerrison  rongeurs, and multiple large fragments of disk material were removed  directly over the nerve root.  Hemostasis was assured with Gelfoam soaked in  thrombin.  After using a dummy sizer, an 8 mm pre-fashioned cervical bone  graft was inserted at the C6-7 level.  Subsequently the C4-5 level was  operated and at this level, there was a  marked biforaminal stenosis.  There  was significant decompression of the nerve roots and nerve root sleeves and  the spinal cord dura.  Again hemostasis was assured and a 7 mm bone graft  was placed at this level.  Subsequently at the C5-6 level the end plates  were decorticated with the high-speed drill.  Large uncinate spurs,  particularly on the right side, were removed, and the C6 nerve roots were  decompressed widely __________ at the neural foramina.  Hemostasis was again  assured.  A 7 mm bone graft was then inserted and counter sunk  appropriately.  The ventral spine was then prepared for plating and ventral  osteophytes were removed with the Leksell rongeur.  A 63 mm Trinica anterior  cervical plate was then affixed to the anterior cervical spine using  variable-angled 14 x 4 mm screws, two at C4, two at C5, two at C6, and two  at C7.  All screws had excellent purchase.  Locking mechanisms were engaged.  A final x-ray confirmed position of the bone grafts and anterior cervical  plate.  The wound was then copiously irrigated with kanamycin irrigation.  There was no evidence of any bleeding.  The soft tissues were inspected and  found to be in good repair.  Subsequently the platysmal layer was closed  with 3-0 Vicryl sutures and the skin edges were reapproximated with running  4-0 Vicryl subcuticular stitch, and the wound was dressed with Dermabond.  The patient was extubated in the operating room and taken to the recovery  room in stable and satisfactory condition, having tolerated his operation  well.  Counts were correct at the end of the case.                                               Danae Orleans. Venetia Maxon, M.D.    JDS/MEDQ  D:  03/20/2003  T:  03/23/2003  Job:  [id number]

## 2011-08-22 ENCOUNTER — Ambulatory Visit (INDEPENDENT_AMBULATORY_CARE_PROVIDER_SITE_OTHER): Payer: Medicare Other | Admitting: Family Medicine

## 2011-08-22 ENCOUNTER — Encounter: Payer: Self-pay | Admitting: Family Medicine

## 2011-08-22 DIAGNOSIS — I2510 Atherosclerotic heart disease of native coronary artery without angina pectoris: Secondary | ICD-10-CM

## 2011-08-22 DIAGNOSIS — E785 Hyperlipidemia, unspecified: Secondary | ICD-10-CM

## 2011-08-22 DIAGNOSIS — Z01818 Encounter for other preprocedural examination: Secondary | ICD-10-CM

## 2011-08-22 DIAGNOSIS — I10 Essential (primary) hypertension: Secondary | ICD-10-CM

## 2011-08-22 NOTE — Progress Notes (Signed)
  Subjective:    Patient ID: Joshua Burns, male    DOB: [birth year], 77 y.o.   MRN: [id number]  HPI Patient seen for preop clearance. Plan left knee arthroscopy. Chronic problems include hyperlipidemia, hypertension, and history of BPH. Medications reviewed. Compliant with all. Denies side effects. Patient has no history of stroke . No peripheral vascular disease history. No history of coagulopathy. He denies any recent dyspnea, dizziness, chest pain, or any focal neurologic symptoms.  Patient did have angioplasty of left circumflex coronary back in 2004. Normal ejection fraction. He had no symptoms whatsoever since then.  Exercise some with calisthenics with no recent chest pain.  Past Medical History  Diagnosis Date  . HYPERLIPIDEMIA 01/28/2010  . HYPERTENSION 01/28/2010  . HYPERTROPHY PROSTATE W/UR OBST & OTH LUTS 01/28/2010  . CAD (coronary artery disease)   . Arthritis   . Glaucoma    Past Surgical History  Procedure Date  . Angioplasty 2004  . Cervical disc surgery 2004  . Hernia repair 2006  . Prostate surgery 2009    bx    reports that he quit smoking about 32 years ago. He does not have any smokeless tobacco history on file. He reports that he drinks alcohol. His drug history not on file. family history includes Heart disease in his father and mother. No Known Allergies   Review of Systems  Constitutional: Negative for fever, activity change, appetite change and fatigue.  HENT: Negative for ear pain, congestion and trouble swallowing.   Eyes: Negative for pain and visual disturbance.  Respiratory: Negative for cough, shortness of breath and wheezing.   Cardiovascular: Negative for chest pain and palpitations.  Gastrointestinal: Negative for nausea, vomiting, abdominal pain, diarrhea, constipation, blood in stool, abdominal distention and rectal pain.  Genitourinary: Negative for dysuria, hematuria and testicular pain.  Musculoskeletal: Negative for joint swelling and  arthralgias.  Skin: Negative for rash.  Neurological: Negative for dizziness, syncope and headaches.  Hematological: Negative for adenopathy.  Psychiatric/Behavioral: Negative for confusion and dysphoric mood.       Objective:   Physical Exam  Constitutional: He is oriented to person, place, and time. He appears well-developed and well-nourished. No distress.  HENT:  Mouth/Throat: Oropharynx is clear and moist.  Neck: Neck supple. No thyromegaly present.       No carotid bruits  Cardiovascular: Normal rate, regular rhythm and normal heart sounds.   No murmur heard. Pulmonary/Chest: Effort normal and breath sounds normal. No respiratory distress. He has no wheezes. He has no rales.  Abdominal: Soft. He exhibits no distension and no mass. There is no tenderness. There is no rebound and no guarding.  Musculoskeletal: He exhibits no edema.  Lymphadenopathy:    He has no cervical adenopathy.  Neurological: He is alert and oriented to person, place, and time. No cranial nerve deficit.  Psychiatric: He has a normal mood and affect. His behavior is normal.          Assessment & Plan:  Preoperative evaluation for knee arthroscopy. Remote history of CAD with angioplasty back in 2004. Very stable since then. Blood pressure well-controlled. Hyperlipidemia well controlled. Obtain screening EKG.  Discuss with cardiology whether to pursue further pre-op testing.

## 2011-08-22 NOTE — Patient Instructions (Signed)
We will call you after talking to Dr [place] regarding his recommendations for any further pre-op testing.

## 2011-08-25 ENCOUNTER — Encounter: Payer: Self-pay | Admitting: Cardiology

## 2011-09-05 ENCOUNTER — Encounter: Payer: Self-pay | Admitting: Cardiology

## 2011-09-05 ENCOUNTER — Ambulatory Visit (INDEPENDENT_AMBULATORY_CARE_PROVIDER_SITE_OTHER): Payer: Medicare Other | Admitting: Cardiology

## 2011-09-05 DIAGNOSIS — I2510 Atherosclerotic heart disease of native coronary artery without angina pectoris: Secondary | ICD-10-CM

## 2011-09-05 DIAGNOSIS — E785 Hyperlipidemia, unspecified: Secondary | ICD-10-CM

## 2011-09-05 NOTE — Assessment & Plan Note (Signed)
He had remote angioplasty left circumflex coronary in 2004. He has remained asymptomatic. His last nuclear study in 2009 was normal. Recent ECG is normal. He is cleared to proceed with his left knee surgery. All final followup again in one year.

## 2011-09-05 NOTE — Assessment & Plan Note (Signed)
Recent lipid parameters are satisfactory. He will continue with simvastatin.

## 2011-09-05 NOTE — Patient Instructions (Signed)
We will clear you for knee surgery.  I will fax a note to Dr. Shelle Iron.  I will see you again in one year.

## 2011-09-05 NOTE — Progress Notes (Signed)
Joshua Burns Date of Birth: 29-Nov-1925   History of Present Illness: Joshua Burns is a pleasant 77 year old white male that I am seeing for preoperative clearance for left total knee replacement at the request of Dr. Shelle Iron. He has a history of coronary disease and underwent angioplasty of the left circumflex coronary in 2004. His last evaluation with a stress Cardiolite study in August of 2009 was normal. He has remained asymptomatic. He denies any chest pain, shortness of breath, palpitations, or dizziness.  Current Outpatient Prescriptions on File Prior to Visit  Medication Sig Dispense Refill  . aspirin 325 MG tablet Take 325 mg by mouth daily.        . benazepril (LOTENSIN) 20 MG tablet Take 20 mg by mouth daily.        Marland Kitchen doxazosin (CARDURA) 4 MG tablet Take 4 mg by mouth daily.        . hydrochlorothiazide (,MICROZIDE/HYDRODIURIL,) 12.5 MG capsule Take 12.5 mg by mouth daily.        . simvastatin (ZOCOR) 20 MG tablet Take 20 mg by mouth at bedtime.        Marland Kitchen ezetimibe-simvastatin (VYTORIN) 10-20 MG per tablet Take 1 tablet by mouth at bedtime.          No Known Allergies  Past Medical History  Diagnosis Date  . HYPERLIPIDEMIA 01/28/2010  . HYPERTENSION 01/28/2010  . HYPERTROPHY PROSTATE W/UR OBST & OTH LUTS 01/28/2010  . CAD (coronary artery disease)   . Arthritis   . Glaucoma     Past Surgical History  Procedure Date  . Angioplasty 2004  . Cervical disc surgery 2004  . Hernia repair 2006  . Prostate surgery 2009    bx  . US echocardiography 08-04-2008    Est EF 55-60%  . Cardiovascular stress test 08-04-2008    EF 0%    History  Smoking status  . Former Smoker  . Quit date: 12/25/1978  Smokeless tobacco  . Not on file    History  Alcohol Use No    Family History  Problem Relation Age of Onset  . Heart disease Mother   . Heart disease Father   . Aneurysm Brother     Review of Systems: The review of systems is positive for arthralgias in his left knee and  back. These do limit his activity.  All other systems were reviewed and are negative.  Physical Exam: BP 150/76  Pulse 60  Wt 163 lb 3.2 oz (74.027 kg) The patient is alert and oriented x 3.  The mood and affect are normal.  The skin is warm and dry.  Color is normal.  The HEENT exam reveals that the sclera are nonicteric.  The mucous membranes are moist.  The carotids are 2+ without bruits.  There is no thyromegaly.  There is no JVD.  The lungs are clear.  The chest wall is non tender.  The heart exam reveals a regular rate with a normal S1 and S2.  There are no murmurs, gallops, or rubs.  The PMI is not displaced.   Abdominal exam reveals good bowel sounds.  There is no guarding or rebound.  There is no hepatosplenomegaly or tenderness.  There are no masses.  Exam of the legs reveal no clubbing, cyanosis, or edema.  The legs are without rashes.  The distal pulses are intact.  Cranial nerves II - XII are intact.  Motor and sensory functions are intact.  The gait is normal. LABORATORY DATA: Blood work  and ECG were reviewed from Dr. Lucie Leather visit on August 28. ECG was normal.  Assessment / Plan:

## 2011-09-25 ENCOUNTER — Ambulatory Visit (HOSPITAL_BASED_OUTPATIENT_CLINIC_OR_DEPARTMENT_OTHER)
Admission: RE | Admit: 2011-09-25 | Discharge: 2011-09-25 | Disposition: A | Discharge status: Home / Self Care | Payer: Medicare Other | Source: Ambulatory Visit | Attending: Specialist | Admitting: Specialist

## 2011-09-25 DIAGNOSIS — I10 Essential (primary) hypertension: Secondary | ICD-10-CM | POA: Insufficient documentation

## 2011-09-25 DIAGNOSIS — M2240 Chondromalacia patellae, unspecified knee: Secondary | ICD-10-CM | POA: Insufficient documentation

## 2011-09-25 DIAGNOSIS — I2510 Atherosclerotic heart disease of native coronary artery without angina pectoris: Secondary | ICD-10-CM | POA: Insufficient documentation

## 2011-09-25 DIAGNOSIS — M2390 Unspecified internal derangement of unspecified knee: Secondary | ICD-10-CM | POA: Insufficient documentation

## 2011-09-25 DIAGNOSIS — Z79899 Other long term (current) drug therapy: Secondary | ICD-10-CM | POA: Insufficient documentation

## 2011-09-25 DIAGNOSIS — E785 Hyperlipidemia, unspecified: Secondary | ICD-10-CM | POA: Insufficient documentation

## 2011-09-25 DIAGNOSIS — IMO0002 Reserved for concepts with insufficient information to code with codable children: Secondary | ICD-10-CM | POA: Insufficient documentation

## 2011-09-25 DIAGNOSIS — X58XXXA Exposure to other specified factors, initial encounter: Secondary | ICD-10-CM | POA: Insufficient documentation

## 2011-09-25 DIAGNOSIS — M1710 Unilateral primary osteoarthritis, unspecified knee: Secondary | ICD-10-CM | POA: Insufficient documentation

## 2011-09-25 HISTORY — PX: KNEE ARTHROSCOPY: SHX127

## 2011-09-25 LAB — POCT I-STAT 4, (NA,K, GLUC, HGB,HCT)
Hemoglobin: 12.6 g/dL — ABNORMAL LOW (ref 13.0–17.0)
Sodium: 143 mEq/L (ref 135–145)

## 2011-09-27 NOTE — Op Note (Signed)
  NAMEBORA, BRONER NO.:  [id number]  MEDICAL RECORD NO.:  [id number]  LOCATION:                               FACILITY:  [facility]  PHYSICIAN:  Jene Every, M.D.    DATE OF BIRTH:  1925-01-05  DATE OF PROCEDURE:  09/25/2011 DATE OF DISCHARGE:                              OPERATIVE REPORT   PREOPERATIVE DIAGNOSES: 1. Medial meniscus tear, left. 2. Degenerative joint disease, left knee.  POSTOPERATIVE DIAGNOSES: 1. Medial meniscus tear, left. 2. Degenerative joint disease, left knee. 3. 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 4. Grade 3 chondromalacia, patellofemoral joint. 5. Degenerative fraying of the lateral meniscus.  PROCEDURE PERFORMED: 1. Left knee arthroscopy. 2. Chondroplasty, patellofemoral joint. 3. Partial medial meniscectomy. 4. Shaving of the lateral meniscus. 5. Chondroplasty, medial femoral condyle.  ANESTHESIA:  General.  ASSISTANT:  None.BRIEF HISTORY:  An 77 year old refractory to conservative treatment. MRI indicating degenerative changes as well as his x-rays.  It is indicated for debridement.  Mechanical symptoms, probable meniscus tear. Risks and benefits discussed including bleeding, infection, no change in symptoms, worsening symptoms, need for repeat debridement, DVT, PE anesthetic complications, etc.  TECHNIQUE:  With the patient in supine position, after induction of adequate anesthesia and 1 g of Kefzol, left lower extremity was prepped and draped in the usual sterile fashion.  A lateral parapatellar portal was fashioned with a #11 blade, Ingress cannula was atraumatically placed.  Irrigant was utilized to insufflate the joint.  Under direct visualization, a medial parapatellar portal was fashioned with a #11 blade after localization with an 18-gauge needle sparing the medial meniscus.  Noticed were extensive grade 3 changes of femoral condyle at the medial compartment.  Extensive  tearing of the meniscus was noted in the junction of the middle and posterior third with a flap tear into the joint.  I introduced a basket rongeur and resected it to a stable base. This was further contoured with the Cuda shaver.  Approximately, 50% of posterior third was excised.  The remnant was stable to probe palpation. Light chondroplasty of the femoral condyle was performed and in the tibial plateau.  ACL was unremarkable.  Lateral compartment revealed degenerative fraying of the lateral meniscus, grade 2 changes of the compartment, I shaved the lateral meniscus.  Examination of suprapatellar pouch revealed grade 3 changes, chondroplasty of patellofemoral joint was performed.  There was normal patellofemoral tracking.  Gutters were unremarkable.  I revisited all compartments.  No further pathology amenable in arthroscopic intervention; therefore, I removed all instrumentation.  Portals were closed with 4-0 nylon simple sutures, 0.25% Marcaine with epinephrine was infiltrated in the joint.  Wound was dressed sterilely.  Awoken without difficulty and transported to the recovery room in satisfactory condition.  The patient tolerated the procedure well with no complications.     Jene Every, M.D.     Cordelia Pen  D:  09/25/2011  T:  09/26/2011  Job:  [id number]  Electronically Signed by Jene Every M.D. on 09/27/2011 09:00:06 AM

## 2012-01-23 ENCOUNTER — Other Ambulatory Visit: Payer: Self-pay | Admitting: Family Medicine

## 2012-03-14 DIAGNOSIS — H04129 Dry eye syndrome of unspecified lacrimal gland: Secondary | ICD-10-CM | POA: Diagnosis not present

## 2012-03-14 DIAGNOSIS — H35049 Retinal micro-aneurysms, unspecified, unspecified eye: Secondary | ICD-10-CM | POA: Diagnosis not present

## 2012-03-14 DIAGNOSIS — H40019 Open angle with borderline findings, low risk, unspecified eye: Secondary | ICD-10-CM | POA: Diagnosis not present

## 2012-03-14 DIAGNOSIS — H35039 Hypertensive retinopathy, unspecified eye: Secondary | ICD-10-CM | POA: Diagnosis not present

## 2012-03-14 DIAGNOSIS — H2510 Age-related nuclear cataract, unspecified eye: Secondary | ICD-10-CM | POA: Diagnosis not present

## 2012-06-13 ENCOUNTER — Ambulatory Visit (INDEPENDENT_AMBULATORY_CARE_PROVIDER_SITE_OTHER): Payer: Medicare Other | Admitting: Family Medicine

## 2012-06-13 ENCOUNTER — Encounter: Payer: Self-pay | Admitting: Family Medicine

## 2012-06-13 VITALS — BP 140/64 | HR 72 | Temp 97.5°F | Resp 12 | Ht 65.0 in | Wt 155.0 lb

## 2012-06-13 DIAGNOSIS — Z23 Encounter for immunization: Secondary | ICD-10-CM

## 2012-06-13 DIAGNOSIS — Z0000 Encounter for general adult medical examination without abnormal findings: Secondary | ICD-10-CM | POA: Diagnosis not present

## 2012-06-13 DIAGNOSIS — I2510 Atherosclerotic heart disease of native coronary artery without angina pectoris: Secondary | ICD-10-CM | POA: Diagnosis not present

## 2012-06-13 DIAGNOSIS — H409 Unspecified glaucoma: Secondary | ICD-10-CM

## 2012-06-13 DIAGNOSIS — J310 Chronic rhinitis: Secondary | ICD-10-CM

## 2012-06-13 DIAGNOSIS — I10 Essential (primary) hypertension: Secondary | ICD-10-CM

## 2012-06-13 DIAGNOSIS — E785 Hyperlipidemia, unspecified: Secondary | ICD-10-CM | POA: Diagnosis not present

## 2012-06-13 LAB — HEPATIC FUNCTION PANEL
AST: 16 U/L (ref 0–37)
Albumin: 4 g/dL (ref 3.5–5.2)
Alkaline Phosphatase: 57 U/L (ref 39–117)
Total Bilirubin: 0.5 mg/dL (ref 0.3–1.2)

## 2012-06-13 LAB — BASIC METABOLIC PANEL
CO2: 31 mEq/L (ref 19–32)
Calcium: 9.5 mg/dL (ref 8.4–10.5)
Creatinine, Ser: 1.3 mg/dL (ref 0.4–1.5)
GFR: 66.25 mL/min (ref >60.00)

## 2012-06-13 LAB — LIPID PANEL
Total CHOL/HDL Ratio: 3
Triglycerides: 49 mg/dL (ref 0.0–149.0)

## 2012-06-13 MED ORDER — FLUTICASONE PROPIONATE 50 MCG/ACT NA SUSP: 2.0000 | Freq: Every day | NASAL | Status: DC

## 2012-06-13 NOTE — Patient Instructions (Addendum)
Continue yearly flu vaccine. Check on insurance coverage for shingles vaccine Continue regular exercise as tolerated

## 2012-06-13 NOTE — Progress Notes (Signed)
Subjective:    Patient ID: Joshua Burns, male    DOB: July 01, 1925, 78 y.o.   MRN: [id number]  HPI  Patient seen for Medicare wellness exam and medical followup. History of CAD, hypertension, hyperlipidemia, and BPH. Also relates he has been diagnosed with glaucoma and followed by ophthalmology every 6 months but not medically treated for that at this time. Medications reviewed. Compliant with all. Denies any side effects. We have recently switched from Vytorin to simvastatin for cost issues. Needs followup labs.  He has frequent nasal congestion mostly at night. No rhinorrhea. No purulent secretions. Has used Afrin briefly but knows he should not use this regularly. Cannot document prior Pneumovax but he thinks he may have had one previously. He gets yearly flu vaccine. No history of shingles vaccine.  Past Medical History  Diagnosis Date  . HYPERLIPIDEMIA 01/28/2010  . HYPERTENSION 01/28/2010  . HYPERTROPHY PROSTATE W/UR OBST & OTH LUTS 01/28/2010  . CAD (coronary artery disease)   . Arthritis   . Glaucoma    Past Surgical History  Procedure Date  . Angioplasty 2004  . Cervical disc surgery 2004  . Hernia repair 2006  . Prostate surgery 2009    bx  . US echocardiography 08-04-2008    Est EF 55-60%  . Cardiovascular stress test 08-04-2008    EF 0%    reports that he quit smoking about 33 years ago. He does not have any smokeless tobacco history on file. He reports that he does not drink alcohol. His drug history not on file. family history includes Aneurysm in his brother and Heart disease in his father and mother. No Known Allergies  1.  Risk factors based on Past Medical , Social, and Family history reviewed as indicated above 2.  Limitations in physical activities exercises with cycling.  Limited walking secondary to osteoarthritis knees. Low risk for fall 3.  Depression/mood screening negative for depression 4.  Hearing no major hearing deficits 5.  ADLs fully independent in  all 6.  Cognitive function (orientation to time and place, language, writing, speech,memory) no short or long-term memory deficits. Language intact 7.  Home Safety no issues identified 8.  Height, weight, and visual acuity. Height and weight stable. Ophthalmology checks every 6 months 9.  Counseling continued exercise. Counseled regarding immunizations including yearly flu vaccine. 10. Recommendation of preventive services. Pneumovax booster. Yearly flu vaccine. Check on coverture shingles vaccine 11. Labs based on risk factors lipid, hepatic, basic metabolic panel 12. Care Plan as above.    Review of Systems  Constitutional: Negative for fever, activity change, appetite change and fatigue.  HENT: Positive for congestion. Negative for ear pain, rhinorrhea, trouble swallowing, voice change, postnasal drip and sinus pressure.   Eyes: Negative for pain and visual disturbance.  Respiratory: Negative for cough, shortness of breath and wheezing.   Cardiovascular: Negative for chest pain and palpitations.  Gastrointestinal: Negative for nausea, vomiting, abdominal pain, diarrhea, constipation, blood in stool, abdominal distention and rectal pain.  Genitourinary: Negative for dysuria, hematuria and testicular pain.  Musculoskeletal: Negative for joint swelling and arthralgias.  Skin: Negative for rash.  Neurological: Negative for dizziness, syncope and headaches.  Hematological: Negative for adenopathy.  Psychiatric/Behavioral: Negative for confusion and dysphoric mood.       Objective:   Physical Exam  Constitutional: He is oriented to person, place, and time. He appears well-developed and well-nourished. No distress.  HENT:  Head: Normocephalic and atraumatic.  Right Ear: External ear normal.  Left Ear: External ear  normal.  Mouth/Throat: Oropharynx is clear and moist.       No nasal polyps.  Eyes: Conjunctivae and EOM are normal. Pupils are equal, round, and reactive to light.  Neck:  Normal range of motion. Neck supple. No thyromegaly present.  Cardiovascular: Normal rate, regular rhythm and normal heart sounds.   No murmur heard. Pulmonary/Chest: No respiratory distress. He has no wheezes. He has no rales.  Abdominal: Soft. Bowel sounds are normal. He exhibits no distension and no mass. There is no tenderness. There is no rebound and no guarding.  Musculoskeletal: He exhibits no edema.  Lymphadenopathy:    He has no cervical adenopathy.  Neurological: He is alert and oriented to person, place, and time. He displays normal reflexes. No cranial nerve deficit.  Skin: No rash noted.  Psychiatric: He has a normal mood and affect.          Assessment & Plan:  #1 health maintenance. Pneumovax booster given. Check coverage for shingles vaccine #2 history of CAD. No recent chest pains. Continue regular cardiology followup  #3 hyperlipidemia. Check lipid and hepatic panel  #4 hypertension stable on current medications. Check basic metabolic panel.  #5 nasal congestion. Probable allergic rhinitis. Flonase nasal 2 sprays per nostril once daily

## 2012-06-14 NOTE — Progress Notes (Signed)
Quick Note:  Pt informed ______ 

## 2012-07-12 DIAGNOSIS — H04129 Dry eye syndrome of unspecified lacrimal gland: Secondary | ICD-10-CM | POA: Diagnosis not present

## 2012-07-12 DIAGNOSIS — H40019 Open angle with borderline findings, low risk, unspecified eye: Secondary | ICD-10-CM | POA: Diagnosis not present

## 2012-07-12 DIAGNOSIS — H2510 Age-related nuclear cataract, unspecified eye: Secondary | ICD-10-CM | POA: Diagnosis not present

## 2012-07-15 ENCOUNTER — Ambulatory Visit: Payer: Medicare Other | Admitting: Family Medicine

## 2012-07-15 DIAGNOSIS — Z23 Encounter for immunization: Secondary | ICD-10-CM | POA: Diagnosis not present

## 2012-07-15 DIAGNOSIS — Z299 Encounter for prophylactic measures, unspecified: Secondary | ICD-10-CM

## 2012-07-15 MED ORDER — ZOSTER VACCINE LIVE 19400 UNT/0.65ML ~~LOC~~ SOLR: 0.6500 mL | Freq: Once | SUBCUTANEOUS | Status: DC

## 2012-09-16 ENCOUNTER — Emergency Department (HOSPITAL_COMMUNITY)
Admission: EM | Admit: 2012-09-16 | Discharge: 2012-09-16 | Disposition: A | Discharge status: Home / Self Care | Payer: Medicare Other | Attending: Emergency Medicine | Admitting: Emergency Medicine

## 2012-09-16 ENCOUNTER — Emergency Department (HOSPITAL_COMMUNITY): Payer: Medicare Other

## 2012-09-16 ENCOUNTER — Encounter (HOSPITAL_COMMUNITY): Payer: Self-pay | Admitting: Emergency Medicine

## 2012-09-16 DIAGNOSIS — R209 Unspecified disturbances of skin sensation: Secondary | ICD-10-CM | POA: Insufficient documentation

## 2012-09-16 DIAGNOSIS — M47812 Spondylosis without myelopathy or radiculopathy, cervical region: Secondary | ICD-10-CM | POA: Diagnosis not present

## 2012-09-16 DIAGNOSIS — R51 Headache: Secondary | ICD-10-CM | POA: Diagnosis not present

## 2012-09-16 DIAGNOSIS — R42 Dizziness and giddiness: Secondary | ICD-10-CM

## 2012-09-16 DIAGNOSIS — I10 Essential (primary) hypertension: Secondary | ICD-10-CM | POA: Insufficient documentation

## 2012-09-16 DIAGNOSIS — R930 Abnormal findings on diagnostic imaging of skull and head, not elsewhere classified: Secondary | ICD-10-CM | POA: Diagnosis not present

## 2012-09-16 DIAGNOSIS — R55 Syncope and collapse: Secondary | ICD-10-CM

## 2012-09-16 DIAGNOSIS — I2510 Atherosclerotic heart disease of native coronary artery without angina pectoris: Secondary | ICD-10-CM | POA: Diagnosis not present

## 2012-09-16 DIAGNOSIS — Z79899 Other long term (current) drug therapy: Secondary | ICD-10-CM | POA: Diagnosis not present

## 2012-09-16 LAB — POCT I-STAT TROPONIN I: Troponin i, poc: 0 ng/mL (ref 0.00–0.08)

## 2012-09-16 LAB — URINALYSIS, ROUTINE W REFLEX MICROSCOPIC
Leukocytes, UA: NEGATIVE
Nitrite: NEGATIVE
Protein, ur: NEGATIVE mg/dL
Specific Gravity, Urine: 1.008 (ref 1.005–1.030)
Urobilinogen, UA: 0.2 mg/dL (ref 0.0–1.0)

## 2012-09-16 LAB — CBC
HCT: 36.5 % — ABNORMAL LOW (ref 39.0–52.0)
MCH: 28.8 pg (ref 26.0–34.0)
MCV: 84.9 fL (ref 78.0–100.0)
Platelets: 123 10*3/uL — ABNORMAL LOW (ref 150–400)
RDW: 13.9 % (ref 11.5–15.5)
WBC: 4.3 10*3/uL (ref 4.0–10.5)

## 2012-09-16 LAB — COMPREHENSIVE METABOLIC PANEL
AST: 14 U/L (ref 0–37)
Albumin: 4.3 g/dL (ref 3.5–5.2)
BUN: 14 mg/dL (ref 6–23)
CO2: 29 mEq/L (ref 19–32)
Calcium: 9.6 mg/dL (ref 8.4–10.5)
Chloride: 101 mEq/L (ref 96–112)
Creatinine, Ser: 1.21 mg/dL (ref 0.50–1.35)
GFR calc non Af Amer: 53 mL/min — ABNORMAL LOW (ref >90)
Total Bilirubin: 0.5 mg/dL (ref 0.3–1.2)

## 2012-09-16 NOTE — ED Provider Notes (Signed)
History     CSN: [id number]  Arrival date & time 09/16/12  1108   First MD Initiated Contact with Patient 09/16/12 1124      Chief Complaint  Patient presents with  . Weakness  History given by patient and his wife who are reliable historians.  (Consider location/radiation/quality/duration/timing/severity/associated sxs/prior treatment) HPIOliver Burns is a pleasant 78 y.o. male presenting with dizziness, gait disturbance, a sense of anxiety, associated with numbness to the his right side and headache.  This occurred as he sat down to breakfast with his wife in a restaurant.  Pt became alarmed, noted that voices around him were causing him anxiety and got up to leave, at this time, he has some gait difficulty, did not fall, but had a roaring sensation in his head.  He is currently symptom free. He denies chest pain, shortness of breath, history of stroke, difficulty talking, confusion, recent illness, dysuria or frequency, fever or chills.  No recent sick contacts.  Past Medical History  Diagnosis Date  . HYPERLIPIDEMIA 01/28/2010  . HYPERTENSION 01/28/2010  . HYPERTROPHY PROSTATE W/UR OBST & OTH LUTS 01/28/2010  . CAD (coronary artery disease)   . Arthritis   . Glaucoma     Past Surgical History  Procedure Date  . Angioplasty 2004  . Cervical disc surgery 2004  . Hernia repair 2006  . Prostate surgery 2009    bx  . US echocardiography 08-04-2008    Est EF 55-60%  . Cardiovascular stress test 08-04-2008    EF 0%    Family History  Problem Relation Age of Onset  . Heart disease Mother   . Heart disease Father   . Aneurysm Brother     History  Substance Use Topics  . Smoking status: Former Smoker    Quit date: 12/25/1978  . Smokeless tobacco: Not on file  . Alcohol Use: No      Review of Systems At least 10pt or greater review of systems completed and are negative except where specified in the HPI.  Allergies  Review of patient's allergies indicates no known  allergies.  Home Medications   Current Outpatient Rx  Name Route Sig Dispense Refill  . ASPIRIN 325 MG PO TABS Oral Take 325 mg by mouth daily.      Marland Kitchen BENAZEPRIL HCL 20 MG PO TABS Oral Take 20 mg by mouth daily.    Marland Kitchen DOXAZOSIN MESYLATE 4 MG PO TABS Oral Take 4 mg by mouth daily.    Marland Kitchen FLUTICASONE PROPIONATE 50 MCG/ACT NA SUSP Nasal Place 2 sprays into the nose daily. 16 g 11  . HYDROCHLOROTHIAZIDE 12.5 MG PO CAPS Oral Take 12.5 mg by mouth daily.    Marland Kitchen MENS 50+ MULTI VITAMIN/MIN PO Oral Take 1 tablet by mouth daily.     Marland Kitchen SIMVASTATIN 20 MG PO TABS Oral Take 20 mg by mouth at bedtime.      BP 203/83  Pulse 67  Temp 97.7 F (36.5 C) (Oral)  Resp 14  SpO2 100%  Physical Exam  PHYSICAL EXAM: VITAL SIGNS:  . Filed Vitals:   09/16/12 1110 09/16/12 1553 09/16/12 1554 09/16/12 1555  BP: 203/83 168/74 172/68 163/71  Pulse: 67  60 70  Temp: 97.7 F (36.5 C)     TempSrc: Oral Oral    Resp: 14 16 16 15   SpO2: 100% 100% 100%    CONSTITUTIONAL: Awake, oriented, appears non-toxic HENT: Atraumatic, normocephalic, oral mucosa pink and moist, airway patent. Nares patent without drainage. External ears  normal. EYES: Conjunctiva clear, EOMI, PERRLA, bilateral arcus senilis NECK: Trachea midline, non-tender, supple CARDIOVASCULAR: Normal heart rate, Normal rhythm, No murmurs, rubs, gallops PULMONARY/CHEST: Clear to auscultation, no rhonchi, wheezes, or rales. Symmetrical breath sounds. CHEST WALL: No lesions. Non-tender. ABDOMINAL: Non-distended, soft, non-tender - no rebound or guarding.  BS normal. NEUROLOGIC: CN: Facial sensation equal to light touch bilaterally.  Good muscle bulk in the masseter muscle and good lateral movement of the jaw.  Facial expressions equal and good strength with smile/frown and puffed cheeks.  Hearing grossly intact to finger rub test.  Uvula, tongue are midline with no deviation. Symmetrical palate elevation.  Trapezius and SCM muscles are 5/5 strength bilaterally.    DTR: Brachioradialis, biceps, patellar, Achilles tendon reflexes 2+ bilaterally.  No clonus. Strength: 5/5 strength flexors and extensors in the upper and lower extremities.  Grip strength, finger adduction/abduction 5/5. Sensation: Sensation intact distally to light touch Cerebellar: No ataxia with walking or dysmetria with finger to nose, rapid alternating hand movements and heels to shin testing. Gait and Station: Normal heel/toe, and tandem gait.  Negative Romberg, no pronator drift EXTREMITIES: No clubbing, cyanosis, or edema SKIN: Warm, Dry, No erythema, No rash  ED Course  Procedures (including critical care time)  Date: 09/17/2012  Rate: 62  Rhythm: normal sinus rhythm  QRS Axis: normal  Intervals: normal  ST/T Wave abnormalities: normal  Conduction Disutrbances: none  Narrative Interpretation: unremarkable  Labs Reviewed  CBC - Abnormal; Notable for the following:    Hemoglobin 12.4 (*)     HCT 36.5 (*)     Platelets 123 (*)     All other components within normal limits  COMPREHENSIVE METABOLIC PANEL - Abnormal; Notable for the following:    Glucose, Bld 103 (*)     GFR calc non Af Amer 53 (*)     GFR calc Af Amer 61 (*)     All other components within normal limits  URINALYSIS, ROUTINE W REFLEX MICROSCOPIC - Abnormal; Notable for the following:    Hgb urine dipstick SMALL (*)     All other components within normal limits  POCT I-STAT TROPONIN I  URINE MICROSCOPIC-ADD ON  LAB REPORT - SCANNED   Ct Head Wo Contrast  09/16/2012  *RADIOLOGY REPORT*  Clinical Data: Left side numbness, headache, history hypertension, coronary artery disease, hyperlipidemia  CT HEAD WITHOUT CONTRAST  Technique:  Contiguous axial images were obtained from the base of the skull through the vertex without contrast.  Comparison: None  Findings: Mild generalized atrophy. Normal ventricular morphology. No midline shift or mass effect. Otherwise normal appearance of brain parenchyma. No  intracranial hemorrhage, mass lesion, or acute infarction. Visualized paranasal sinuses clear. Bones unremarkable. Atherosclerotic calcification of internal carotid arteries bilaterally at skull base.  IMPRESSION: Generalized atrophy. No acute intracranial abnormalities.   Original Report Authenticated By: Lollie Marrow, M.D.    Mr Brain Wo Contrast  09/16/2012  *RADIOLOGY REPORT*  Clinical Data:  Near-syncope.  Evaluate for posterior circulation event.  History of hypertension.  MRI HEAD WITHOUT CONTRAST MRA HEAD WITHOUT CONTRAST  Technique:  Multiplanar, multiecho pulse sequences of the brain and surrounding structures were obtained without intravenous contrast. Angiographic images of the head were obtained using MRA technique without contrast.  Comparison:  CT head 09/17/2011.  MRI HEAD  Findings:  There is no evidence for acute infarction, intracranial hemorrhage, mass lesion, hydrocephalus, or extra-axial fluid. Moderate atrophy is present.  Chronic microvascular ischemic change is noted in the periventricular and subcortical  white matter. There are no foci of chronic hemorrhage.  There are no areas of lacunar infarction seen.  No large vessel cortical infarct is present.  The carotid arteries are widely patent.  The basilar artery is widely patent with both vertebrals contributing.  Normal pituitary and cerebellar tonsils.  Advanced cervical spondylosis at the C3-C4 level with apparent spinal stenosis.  Negative orbits, sinuses, and mastoids. There is a similar appearance to earlier CT.  IMPRESSION: No acute or focal intracranial abnormality.  Age related atrophy and chronic microvascular ischemic change.  Cervical spondylosis, incompletely evaluated.  MRA HEAD  Findings: Mild nonstenotic irregularity both internal carotid arteries.  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  The basilar artery  is widely patent with both the right and left vertebrals contributing roughly equally.  Intracranially there is no proximal stenosis of the anterior, middle, or posterior cerebral arteries. There is no cerebellar branch occlusion.  Moderate dolichoectasia is present without flow reducing stenosis.  No intracranial berry aneurysms is observed.  IMPRESSION: Atheromatous outpouching left cavernous internal carotid artery. No flow reducing stenosis or vascular occlusion.   Original Report Authenticated By: Elsie Stain, M.D.    Mr Mra Head/brain Wo Cm  09/16/2012  *RADIOLOGY REPORT*  Clinical Data:  Near-syncope.  Evaluate for posterior circulation event.  History of hypertension.  MRI HEAD WITHOUT CONTRAST MRA HEAD WITHOUT CONTRAST  Technique:  Multiplanar, multiecho pulse sequences of the brain and surrounding structures were obtained without intravenous contrast. Angiographic images of the head were obtained using MRA technique without contrast.  Comparison:  CT head 09/17/2011.  MRI HEAD  Findings:  There is no evidence for acute infarction, intracranial hemorrhage, mass lesion, hydrocephalus, or extra-axial fluid. Moderate atrophy is present.  Chronic microvascular ischemic change is noted in the periventricular and subcortical white matter. There are no foci of chronic hemorrhage.  There are no areas of lacunar infarction seen.  No large vessel cortical infarct is present.  The carotid arteries are widely patent.  The basilar artery is widely patent with both vertebrals contributing.  Normal pituitary and cerebellar tonsils.  Advanced cervical spondylosis at the C3-C4 level with apparent spinal stenosis.  Negative orbits, sinuses, and mastoids. There is a similar appearance to earlier CT.  IMPRESSION: No acute or focal intracranial abnormality.  Age related atrophy and chronic microvascular ischemic change.  Cervical spondylosis, incompletely evaluated.  MRA HEAD  Findings: Mild nonstenotic irregularity both  internal carotid arteries.  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  The basilar artery is widely patent with both the right and left vertebrals contributing roughly equally.  Intracranially there is no proximal stenosis of the anterior, middle, or posterior cerebral arteries. There is no cerebellar branch occlusion.  Moderate dolichoectasia is present without flow reducing stenosis.  No intracranial berry aneurysms is observed.  IMPRESSION: Atheromatous outpouching left cavernous internal carotid artery. No flow reducing stenosis or vascular occlusion.   Original Report Authenticated By: Elsie Stain, M.D.      1. Near syncope   2. Dizziness     MDM  Joshua Burns is a 78 y.o. male presents with symptoms concerning for TIA, pt has an ABCD2 score of 3 - making him low risk for future stroke, and he is asymptomatic currently, however gestalt deems this patient require advanced imaging.  I will obtain  MRI/MRA and  rule out acute infarct.  Will also obtain basic labs and cardiac biomarkers - though EKG is negative, and not chest pain.  I don't think this patient's symptoms are ACS or cardiac related.  I suspect some a component of anxiety.  MRI/MRA are largely unremarkable, no acute infarct. - MRA describes an outpouching of the left cavernous ICA w/o occlusion or stenosis.  I communicated this to the patient - he will follow up with PCP and OP workup.  Patient is not orthostatic.  Has had no symptoms over long period of observation in ED.  He is able to walk unassisted without problems.    I explained the diagnosis and have given explicit precautions to return to the ER including reoccurrence of symptoms, weakness, numbness, tingling, difficulties walking talking or any signs of stroke to include any other new or worsening symptoms. The patient understands and accepts the medical plan as  it's been dictated and I have answered their questions. Discharge instructions concerning home care and prescriptions have been given.  The patient is STABLE and is discharged to home in good condition.         Jones Skene, MD 09/17/12 2213

## 2012-09-16 NOTE — ED Notes (Signed)
Pt presenting to ed with c/o numbness to right side with headache pain. Pt states no pain at this time. Pt states he had a roaring sensation in his head earlier. Pt states onset 9:00am. Pt is alert and oriented at this time. Pt denies chest pain nausea and vomiting. Pt with equal bilateral grips, no facial droop noted, no slurred speech noted.

## 2012-09-19 ENCOUNTER — Ambulatory Visit (INDEPENDENT_AMBULATORY_CARE_PROVIDER_SITE_OTHER): Payer: Medicare Other | Admitting: Family Medicine

## 2012-09-19 ENCOUNTER — Encounter: Payer: Self-pay | Admitting: Family Medicine

## 2012-09-19 VITALS — BP 170/70 | Temp 97.9°F | Wt 161.0 lb

## 2012-09-19 DIAGNOSIS — Z23 Encounter for immunization: Secondary | ICD-10-CM

## 2012-09-19 DIAGNOSIS — R930 Abnormal findings on diagnostic imaging of skull and head, not elsewhere classified: Secondary | ICD-10-CM

## 2012-09-19 DIAGNOSIS — I10 Essential (primary) hypertension: Secondary | ICD-10-CM | POA: Diagnosis not present

## 2012-09-19 DIAGNOSIS — G459 Transient cerebral ischemic attack, unspecified: Secondary | ICD-10-CM | POA: Diagnosis not present

## 2012-09-19 MED ORDER — AMLODIPINE BESYLATE 5 MG PO TABS: 5.0000 mg | ORAL_TABLET | Freq: Every day | ORAL | Status: DC

## 2012-09-19 NOTE — Patient Instructions (Addendum)
Transient Ischemic Attack A transient ischemic attack (TIA) is a "warning stroke" that causes stroke-like symptoms. Unlike a stroke, a TIA does not cause permanent damage to the brain. The symptoms of a TIA can happen very fast and do not last long. It is important to know the symptoms of a TIA and what to do. This can help prevent a major stroke or death. CAUSES   A TIA is caused by a temporary blockage in an artery in the brain or neck (carotid artery). The blockage does not allow the brain to get the blood supply it needs and can cause different symptoms. The blockage can be caused by either:   A blood clot.   Fatty buildup (plaque) in a neck or brain artery.  SYMPTOMS  TIA symptoms are the same as a stroke but are temporary. Symptoms can include sudden:  Numbness or weakness on one side of the body. Especially to the:   Face.   Arm.   Leg.   Trouble speaking, thinking, or confusion.   Change in vision, such as trouble seeing in one or both eyes.   Dizziness, loss of balance, or difficulty walking.   Severe headache.  ANY OF THESE SYMPTOMS MAY REPRESENT A SERIOUS PROBLEM THAT IS AN EMERGENCY. Do not wait to see if the symptoms will go away. Get medical help at once. Call your local emergency services (911 in [place] IMMEDIATELY. DO NOT drive yourself to the hospital. RISK FACTORS Risk factors can increase the risk of developing a TIA. These can include.   High blood pressure (hypertension).   High cholesterol (hyperlipidemia).   Heart disease (atherosclerosis).   Smoking.   Diabetes.   Abnormal heart rhythm (atrial fibrillation).   Family history of a stroke or heart attack.   Use of oral contraceptives (especially when combined with smoking).  DIAGNOSIS   A TIA can be diagnosed based on your:   Symptoms.   History.   Risk factors.   Tests that can help diagnose the symptoms of a TIA include:   CT or MRI scan. These tests can provide detailed images of the  brain.   Carotid ultrasound. This test looks to see if there are blockages in the carotid arteries of your neck.   Arteriography. A thin, small flexible tube (catheter) is inserted through a small cut (incision) in your groin. The catheter is threaded to your carotid or vertebral artery. A dye is then injected into the catheter. The dye highlights the arteries in your brain and allows your caregiver to look for narrowing or blockages that can cause a TIA.  TREATMENT  Based on the cause of a TIA, treatment options can vary. Treatment is important to help prevent a stroke. Treatment options can include:  Medication. Such as:   Clot-busting medicine.   Anti-platelet medicine.   Blood pressure medicine.   Blood thinner medicine.   Surgery:   Carotid endarterectomy. The carotid arteries are the arteries that supply the head and neck with oxygenated blood. This surgery can help remove fatty deposits (plaque) in the carotid arteries.   Angioplasty and stenting. This surgery uses a balloon to dilate a blocked artery in the brain. A stent is a small, metal mesh tube that can help keep an artery open  HOME CARE INSTRUCTIONS   It is important to take all medicine as told by your caregiver. If the medicine has side effects that affect you negatively, tell your caregiver right away. Do not stop taking medicine unless told   by your caregiver. Some medicines may need to be changed to better treat your condition.   Do not smoke. Talk to your caregiver on how to quit smoking.   Eat a diet high in fruits, vegetables and lean meat. Avoid a high fat, high salt diet. A dietician can you help you make healthy food choices.   Maintain a healthy weight. Develop an exercise plan approved by your caregiver.  SEEK IMMEDIATE MEDICAL CARE IF:   You develop weakness or numbness on one side of your body.   You have problems thinking, speaking, or feel confused.   You have vision changes.   You feel dizzy,  have trouble walking, or lose your balance.   You develop a severe headache.  MAKE SURE YOU:   Understand these instructions.   Will watch your condition.   Will get help right away if you are not doing well or get worse.  Document Released: 09/20/2005 Document Revised: 11/30/2011 Document Reviewed: 02/03/2010 [employer] Patient Information 2012 [employer]. 

## 2012-09-19 NOTE — Progress Notes (Signed)
Subjective:    Patient ID: Joshua Burns, male    DOB: Jul 02, 1925, 78 y.o.   MRN: [id number]  HPI  Hospital followup. Patient on 09/16/2012 was going to breakfast. While in the restaurant he noticed some vague dizziness, possible gait disturbance and increased anxiety. This was followed by some possible right-sided weakness. No dysarthria or dysphagia. He left the restaurant and eventually presented to the emergency room. He denied any chest pain, shortness of breath, confusion, speech changes, fever, or chills.  Patient has hypertension and history of CAD. He takes blood pressure medications and has been compliant with those and blood pressures have been well controlled by home readings., ER presentation blood pressure 203/83. This eventually came down some during his stay. Workup included labs which were unremarkable. He has chronic slightly low platelets. CT head generalized atrophy with no acute abnormality. MRI brain without contrast no acute or focal abnormalities. Age-related atrophy and chronic microvascular ischemic changes. MR angiogram revealed no flow reducing stenosis or vascular occlusion. He did have sessile out pouching from cavernous segment left internal carotid with 3 mm broad based intra-cavernous aneurysm.  Patient's symptoms had fully resolved in the emergency room. He was not admitted.  No recurrent symptoms since discharge. Not monitoring blood pressures regularly.  Past Medical History  Diagnosis Date  . HYPERLIPIDEMIA 01/28/2010  . HYPERTENSION 01/28/2010  . HYPERTROPHY PROSTATE W/UR OBST & OTH LUTS 01/28/2010  . CAD (coronary artery disease)   . Arthritis   . Glaucoma    Past Surgical History  Procedure Date  . Angioplasty 2004  . Cervical disc surgery 2004  . Hernia repair 2006  . Prostate surgery 2009    bx  . US echocardiography 08-04-2008    Est EF 55-60%  . Cardiovascular stress test 08-04-2008    EF 0%    reports that he quit smoking about 33 years ago. He  does not have any smokeless tobacco history on file. He reports that he does not drink alcohol. His drug history not on file. family history includes Aneurysm in his brother and Heart disease in his father and mother. No Known Allergies    Review of Systems  Constitutional: Negative for fatigue and unexpected weight change.  Eyes: Negative for visual disturbance.  Respiratory: Negative for cough, chest tightness and shortness of breath.   Cardiovascular: Negative for chest pain, palpitations and leg swelling.  Genitourinary: Negative for dysuria.  Neurological: Negative for dizziness, seizures, syncope, speech difficulty, weakness, light-headedness and headaches.  Psychiatric/Behavioral: Negative for confusion.       Objective:   Physical Exam  Constitutional: He is oriented to person, place, and time. He appears well-developed and well-nourished.  Neck: Neck supple.       No carotid bruit  Cardiovascular: Normal rate and regular rhythm.   Pulmonary/Chest: Effort normal and breath sounds normal. No respiratory distress. He has no wheezes. He has no rales.  Musculoskeletal: He exhibits no edema.  Neurological: He is alert and oriented to person, place, and time. He has normal reflexes. No cranial nerve deficit. Coordination normal.       No focal strength deficits. Gait normal.  Psychiatric: He has a normal mood and affect. His behavior is normal.          Assessment & Plan:  #1 patient recently presented with transient symptoms of dizziness, gait disturbance, reported right-sided weakness, increased anxiety symptoms, and bilateral tinnitus without acute hearing change. Concern for possible TIA. Already takes aspirin 325 mg daily. No flow limiting  internal carotid lesions. No recurrent symptoms. No evidence for arrhythmia. Recent EKG sinus rhythm and regular today as well  #2 hypertension suboptimal control. Repeat blood pressure right and left arm seated 158/70 after rest.  Initiate amlodipine 5 mg daily and reassess 2 weeks. 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  Vascular surgical referral to follow and further recommendations

## 2012-09-23 ENCOUNTER — Telehealth: Payer: Self-pay | Admitting: Family Medicine

## 2012-09-23 NOTE — Telephone Encounter (Signed)
[facility] called and said that pt has been sch and ov for 09/25/12.

## 2012-09-24 ENCOUNTER — Encounter: Payer: Self-pay | Admitting: Vascular Surgery

## 2012-09-25 ENCOUNTER — Ambulatory Visit (INDEPENDENT_AMBULATORY_CARE_PROVIDER_SITE_OTHER): Payer: Medicare Other | Admitting: Vascular Surgery

## 2012-09-25 ENCOUNTER — Other Ambulatory Visit: Payer: Medicare Other

## 2012-09-25 ENCOUNTER — Encounter: Payer: Self-pay | Admitting: Vascular Surgery

## 2012-09-25 VITALS — BP 152/62 | HR 69 | Ht 65.0 in | Wt 161.9 lb

## 2012-09-25 DIAGNOSIS — I671 Cerebral aneurysm, nonruptured: Secondary | ICD-10-CM

## 2012-09-25 DIAGNOSIS — G459 Transient cerebral ischemic attack, unspecified: Secondary | ICD-10-CM | POA: Diagnosis not present

## 2012-09-25 DIAGNOSIS — Z8673 Personal history of transient ischemic attack (TIA), and cerebral infarction without residual deficits: Secondary | ICD-10-CM

## 2012-09-25 NOTE — Progress Notes (Signed)
Carotid duplex performed @ [facility] 09/25/2012

## 2012-09-25 NOTE — Progress Notes (Signed)
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  HPI: Joshua Burns is a 78 y.o. male who on 09/16/2012, developed the sudden onset of dizziness and anxiety while he was in a restaurant. He later noted some mild paresthesias on the right side of his body and presented to the emergency department. Also describes a feeling that everything was especially lateral. This symptom lasted until yesterday and now has resolved. His weakness  Results fairly quickly. He underwent an extensive workup including a CT scan of the head which showed no acute findings. He did have some generalized atrophy. 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 He was sent for vascular consultation.  He is right-handed. He denies any previous history of stroke, TIAs, expressive or receptive aphasia, or amaurosis fugax.  I have reviewed his records from Dr. Lucie Leather office. He was evaluated there on 09/19/2012. He was concern for possible TIA given the patient's transient symptoms of dizziness, gait disturbance, possible right sided weakness. In addition he noted the change in his hearing. He does have a history of hypertension with suboptimal control.  Past Medical History  Diagnosis Date  . HYPERLIPIDEMIA 01/28/2010  . HYPERTENSION 01/28/2010  . HYPERTROPHY PROSTATE W/UR OBST & OTH LUTS 01/28/2010  . CAD (coronary artery disease)   . Arthritis   . Glaucoma     Family History  Problem Relation Age of Onset  . Heart disease Mother   . Heart disease Father   . Aneurysm Brother     SOCIAL HISTORY: History  Substance Use Topics  . Smoking status: Former Smoker    Quit date: 12/25/1978  . Smokeless tobacco: Never Used  . Alcohol Use: No    No Known Allergies  Current Outpatient  Prescriptions  Medication Sig Dispense Refill  . amLODipine (NORVASC) 5 MG tablet Take 1 tablet (5 mg total) by mouth daily.  30 tablet  5  . aspirin 325 MG tablet Take 325 mg by mouth daily.        . benazepril (LOTENSIN) 20 MG tablet Take 20 mg by mouth daily.      Marland Kitchen doxazosin (CARDURA) 4 MG tablet Take 4 mg by mouth daily.      . fluticasone (FLONASE) 50 MCG/ACT nasal spray Place 2 sprays into the nose daily.  16 g  11  . hydrochlorothiazide (MICROZIDE) 12.5 MG capsule Take 12.5 mg by mouth daily.      . Multiple Vitamins-Minerals (MENS 50+ MULTI VITAMIN/MIN PO) Take 1 tablet by mouth daily.       . simvastatin (ZOCOR) 20 MG tablet Take 20 mg by mouth at bedtime.        REVIEW OF SYSTEMS: Arly.Keller ] denotes positive finding; [  ] denotes negative finding  CARDIOVASCULAR:  [ ]  chest pain   [ ]  chest pressure   [ ]  palpitations   [ ]  orthopnea   [ ]  dyspnea on exertion   [ ]  claudication   [ ]  rest pain   [ ]  DVT   [ ]  phlebitis PULMONARY:   [ ]  productive cough   [ ]  asthma   [ ]  wheezing NEUROLOGIC:   [ ]  weakness  [ ]  paresthesias  [ ]  aphasia  [ ]  amaurosis  [ ]  dizziness HEMATOLOGIC:   [ ]   bleeding problems   [ ]  clotting disorders MUSCULOSKELETAL:  [ ]  joint pain   [ ]  joint swelling [ ]  leg swelling GASTROINTESTINAL: [ ]   blood in stool  [ ]   hematemesis GENITOURINARY:  [ ]   dysuria  [ ]   hematuria PSYCHIATRIC:  [ ]  history of major depression INTEGUMENTARY:  [ ]  rashes  [ ]  ulcers CONSTITUTIONAL:  [ ]  fever   [ ]  chills  PHYSICAL EXAM: Filed Vitals:   09/25/12 1051  BP: 152/62  Pulse: 70  Height: 5\' 5"  (1.651 m)  Weight: 161 lb 14.4 oz (73.437 kg)  SpO2: 100%   Body mass index is 26.94 kg/(m^2). GENERAL: The patient is a well-nourished male, in no acute distress. The vital signs are documented above. CARDIOVASCULAR: There is a regular rate and rhythm. I do not detect carotid bruits. He has palpable femoral pulses. I cannot palpate popliteal or pedal pulses. PULMONARY: There is  good air exchange bilaterally without wheezing or rales. ABDOMEN: Soft and non-tender with normal pitched bowel sounds. I do not palpate an abdominal aortic aneurysm. MUSCULOSKELETAL: There are no major deformities or cyanosis. NEUROLOGIC: No focal weakness or paresthesias are detected. SKIN: There are no ulcers or rashes noted. PSYCHIATRIC: The patient has a normal affect.  DATA:  I have reviewed his CT scan which shows no acute findings. He has some generalized atrophy.  I have also reviewed his MRA of the head. This shows a focal outpouching of the left cavernous internal carotid artery.  I have ordered a carotid duplex scan to be sure there is no extracranial carotid disease which could not be seen on MRA and might potentially explain his transient right sided weakness and paresthesias. This shows no significant internal carotid artery disease bilaterally. Both vertebral arteries are patent with normally directed flow.  MEDICAL ISSUES: This patient has an atheromatous outpouching of the left cavernous internal carotid artery which was noted on MRA of the head. If in fact he did have right-sided weakness and paresthesias this could be a potential source of his symptoms. We will refer him to the neurosurgeons for further evaluation of this. I have explained to the patient that this is intracranial and therefore would have to be addressed by the neurosurgeons.  I reassured him that his carotid duplex scan showed no evidence of carotid disease. We'll be happy to see him back at any time if any new vascular issues arise.  Mardee Clune S [facility]: [phone]

## 2012-10-03 ENCOUNTER — Encounter: Payer: Self-pay | Admitting: Family Medicine

## 2012-10-03 ENCOUNTER — Ambulatory Visit (INDEPENDENT_AMBULATORY_CARE_PROVIDER_SITE_OTHER): Payer: Medicare Other | Admitting: Family Medicine

## 2012-10-03 VITALS — BP 142/60 | Temp 97.8°F | Wt 167.0 lb

## 2012-10-03 DIAGNOSIS — I10 Essential (primary) hypertension: Secondary | ICD-10-CM | POA: Diagnosis not present

## 2012-10-03 MED ORDER — AMLODIPINE BESY-BENAZEPRIL HCL 5-20 MG PO CAPS: 1.0000 | ORAL_CAPSULE | Freq: Every day | ORAL | Status: DC

## 2012-10-03 NOTE — Progress Notes (Signed)
  Subjective:    Patient ID: Joshua Burns, male    DOB: September 20, 1925, 78 y.o.   MRN: [id number]  HPI  Here for reevaluation hypertension. Poor control. Added amlodipine 5 mg daily and blood pressures better at this time. Probable recent TIA. No recurrent symptoms. Recent tinnitus has resolved. Patient seen by vascular surgeon and carotid studies revealed no evidence for extracranial internal carotid stenosis. 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 Neurosurgical consult pending.  Overall feels well. No chest pains. No dizziness. Rare fleeting headache. No peripheral edema. Blood pressures improved control by home readings  Past Medical History  Diagnosis Date  . HYPERLIPIDEMIA 01/28/2010  . HYPERTENSION 01/28/2010  . HYPERTROPHY PROSTATE W/UR OBST & OTH LUTS 01/28/2010  . CAD (coronary artery disease)   . Arthritis   . Glaucoma(365)    Past Surgical History  Procedure Date  . Angioplasty 2004  . Cervical disc surgery 2004  . Hernia repair 2006  . Prostate surgery 2009    bx  . US echocardiography 08-04-2008    Est EF 55-60%  . Cardiovascular stress test 08-04-2008    EF 0%  . Prostate biopsy 03/2008    reports that he quit smoking about 33 years ago. He has never used smokeless tobacco. He reports that he does not drink alcohol or use illicit drugs. family history includes Aneurysm in his brother and Heart disease in his father and mother. No Known Allergies    Review of Systems  Constitutional: Negative for fatigue.  Eyes: Negative for visual disturbance.  Respiratory: Negative for cough, chest tightness and shortness of breath.   Cardiovascular: Negative for chest pain, palpitations and leg swelling.  Neurological: Negative for dizziness, syncope, weakness and light-headedness.       Objective:   Physical Exam  Constitutional: He appears well-developed and well-nourished.  Neck: Neck supple. No thyromegaly present.  Cardiovascular:  Normal rate and regular rhythm.  Exam reveals no gallop.   Pulmonary/Chest: Effort normal and breath sounds normal. No respiratory distress. He has no wheezes. He has no rales.  Musculoskeletal: He exhibits no edema.          Assessment & Plan:  Hypertension. Improved by my readings here today. For simplicity, change benazepril and amlodipine to Lotrel 5/20 one daily. Continue other antihypertensive medications. Continue aspirin. 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

## 2012-10-07 DIAGNOSIS — I671 Cerebral aneurysm, nonruptured: Secondary | ICD-10-CM | POA: Diagnosis not present

## 2013-01-01 ENCOUNTER — Ambulatory Visit (INDEPENDENT_AMBULATORY_CARE_PROVIDER_SITE_OTHER): Payer: Medicare Other | Admitting: Family Medicine

## 2013-01-01 ENCOUNTER — Encounter: Payer: Self-pay | Admitting: Family Medicine

## 2013-01-01 VITALS — BP 140/58 | Temp 98.6°F | Wt 164.0 lb

## 2013-01-01 DIAGNOSIS — I10 Essential (primary) hypertension: Secondary | ICD-10-CM | POA: Diagnosis not present

## 2013-01-01 DIAGNOSIS — J069 Acute upper respiratory infection, unspecified: Secondary | ICD-10-CM

## 2013-01-01 DIAGNOSIS — E785 Hyperlipidemia, unspecified: Secondary | ICD-10-CM | POA: Diagnosis not present

## 2013-01-01 DIAGNOSIS — N401 Enlarged prostate with lower urinary tract symptoms: Secondary | ICD-10-CM

## 2013-01-01 MED ORDER — HYDROCODONE-HOMATROPINE 5-1.5 MG/5ML PO SYRP: 5.0000 mL | ORAL_SOLUTION | Freq: Four times a day (QID) | ORAL | Status: DC | PRN

## 2013-01-01 NOTE — Patient Instructions (Signed)

## 2013-01-01 NOTE — Progress Notes (Signed)
  Subjective:    Patient ID: Joshua Burns, male    DOB: Oct 12, 1925, 79 y.o.   MRN: [id number]  HPI Patient seen for medical followup. History of CAD, hypertension, hyperlipidemia, and BPH. Medications reviewed. Compliant with all. Occasional nocturia and slow urinary stream. Has acute issue of 6 day history of nasal congestion and nonproductive cough. No fever. Mild body aches. No dyspnea. No hemoptysis.  No recent chest pains. Blood pressures been well controlled. No orthostasis or dizziness. Lipids were checked last June at followup and normal. He remains on aspirin 1 daily.  Past Medical History  Diagnosis Date  . HYPERLIPIDEMIA 01/28/2010  . HYPERTENSION 01/28/2010  . HYPERTROPHY PROSTATE W/UR OBST & OTH LUTS 01/28/2010  . CAD (coronary artery disease)   . Arthritis   . Glaucoma(365)    Past Surgical History  Procedure Date  . Angioplasty 2004  . Cervical disc surgery 2004  . Hernia repair 2006  . Prostate surgery 2009    bx  . US echocardiography 08-04-2008    Est EF 55-60%  . Cardiovascular stress test 08-04-2008    EF 0%  . Prostate biopsy 03/2008    reports that he quit smoking about 34 years ago. He has never used smokeless tobacco. He reports that he does not drink alcohol or use illicit drugs. family history includes Aneurysm in his brother and Heart disease in his father and mother. No Known Allergies    Review of Systems  Constitutional: Negative for fatigue.  HENT: Positive for congestion.   Eyes: Negative for visual disturbance.  Respiratory: Positive for cough. Negative for chest tightness and shortness of breath.   Cardiovascular: Negative for chest pain, palpitations and leg swelling.  Neurological: Negative for dizziness, syncope, weakness, light-headedness and headaches.       Objective:   Physical Exam  Constitutional: He appears well-developed and well-nourished.  HENT:  Right Ear: External ear normal.  Left Ear: External ear normal.  Mouth/Throat:  Oropharynx is clear and moist.  Neck: Neck supple.  Cardiovascular: Normal rate and regular rhythm.   Pulmonary/Chest: Effort normal and breath sounds normal. No respiratory distress. He has no wheezes. He has no rales.  Musculoskeletal: He exhibits no edema.  Lymphadenopathy:    He has no cervical adenopathy.          Assessment & Plan:  #1 acute viral upper respiratory infection. Hycodan cough syrup for nighttime use. Followup promptly for any fever #2 hypertension. Stable. Continue current medications  #3 hyperlipidemia. Recheck lipids at followup in 6 months #4 BPH. Symptomatically stable. Continue doxazosin. No orthostasis

## 2013-01-06 ENCOUNTER — Emergency Department (HOSPITAL_COMMUNITY)
Admission: EM | Admit: 2013-01-06 | Discharge: 2013-01-06 | Disposition: A | Discharge status: Home / Self Care | Payer: Medicare Other | Attending: Emergency Medicine | Admitting: Emergency Medicine

## 2013-01-06 ENCOUNTER — Encounter: Payer: Self-pay | Admitting: Family Medicine

## 2013-01-06 ENCOUNTER — Telehealth: Payer: Self-pay | Admitting: Family Medicine

## 2013-01-06 ENCOUNTER — Ambulatory Visit (INDEPENDENT_AMBULATORY_CARE_PROVIDER_SITE_OTHER): Payer: Medicare Other | Admitting: Family Medicine

## 2013-01-06 ENCOUNTER — Encounter (HOSPITAL_COMMUNITY): Payer: Self-pay | Admitting: *Deleted

## 2013-01-06 ENCOUNTER — Ambulatory Visit: Payer: Self-pay | Admitting: Family Medicine

## 2013-01-06 VITALS — BP 166/86 | Temp 98.4°F | Wt 164.0 lb

## 2013-01-06 DIAGNOSIS — T464X5A Adverse effect of angiotensin-converting-enzyme inhibitors, initial encounter: Secondary | ICD-10-CM

## 2013-01-06 DIAGNOSIS — Z7982 Long term (current) use of aspirin: Secondary | ICD-10-CM | POA: Diagnosis not present

## 2013-01-06 DIAGNOSIS — Z87891 Personal history of nicotine dependence: Secondary | ICD-10-CM | POA: Diagnosis not present

## 2013-01-06 DIAGNOSIS — T783XXA Angioneurotic edema, initial encounter: Secondary | ICD-10-CM | POA: Insufficient documentation

## 2013-01-06 DIAGNOSIS — I2510 Atherosclerotic heart disease of native coronary artery without angina pectoris: Secondary | ICD-10-CM | POA: Insufficient documentation

## 2013-01-06 DIAGNOSIS — N138 Other obstructive and reflux uropathy: Secondary | ICD-10-CM | POA: Insufficient documentation

## 2013-01-06 DIAGNOSIS — T465X5A Adverse effect of other antihypertensive drugs, initial encounter: Secondary | ICD-10-CM

## 2013-01-06 DIAGNOSIS — T4995XA Adverse effect of unspecified topical agent, initial encounter: Secondary | ICD-10-CM | POA: Insufficient documentation

## 2013-01-06 DIAGNOSIS — I10 Essential (primary) hypertension: Secondary | ICD-10-CM | POA: Diagnosis not present

## 2013-01-06 DIAGNOSIS — Z8669 Personal history of other diseases of the nervous system and sense organs: Secondary | ICD-10-CM | POA: Diagnosis not present

## 2013-01-06 DIAGNOSIS — N401 Enlarged prostate with lower urinary tract symptoms: Secondary | ICD-10-CM | POA: Diagnosis not present

## 2013-01-06 DIAGNOSIS — E785 Hyperlipidemia, unspecified: Secondary | ICD-10-CM | POA: Insufficient documentation

## 2013-01-06 DIAGNOSIS — Z8739 Personal history of other diseases of the musculoskeletal system and connective tissue: Secondary | ICD-10-CM | POA: Diagnosis not present

## 2013-01-06 DIAGNOSIS — Z79899 Other long term (current) drug therapy: Secondary | ICD-10-CM | POA: Diagnosis not present

## 2013-01-06 LAB — POCT I-STAT, CHEM 8
BUN: 21 mg/dL (ref 6–23)
Calcium, Ion: 1.2 mmol/L (ref 1.13–1.30)
Chloride: 105 mEq/L (ref 96–112)
Creatinine, Ser: 1.5 mg/dL — ABNORMAL HIGH (ref 0.50–1.35)
Glucose, Bld: 88 mg/dL (ref 70–99)
TCO2: 28 mmol/L (ref 0–100)

## 2013-01-06 MED ORDER — AMLODIPINE BESYLATE 10 MG PO TABS: 10.0000 mg | ORAL_TABLET | Freq: Every day | ORAL | Status: DC

## 2013-01-06 MED ORDER — HYDROCHLOROTHIAZIDE 12.5 MG PO CAPS: 12.5000 mg | ORAL_CAPSULE | Freq: Every morning | ORAL | Status: DC

## 2013-01-06 MED ORDER — SIMVASTATIN 20 MG PO TABS: 20.0000 mg | ORAL_TABLET | Freq: Every day | ORAL | Status: DC

## 2013-01-06 MED ORDER — FAMOTIDINE IN NACL 20-0.9 MG/50ML-% IV SOLN
20.0000 mg | Freq: Once | INTRAVENOUS | Status: AC
  Administered 2013-01-06: 20 mg via INTRAVENOUS
  Filled 2013-01-06: qty 50

## 2013-01-06 MED ORDER — DEXAMETHASONE SODIUM PHOSPHATE 10 MG/ML IJ SOLN
10.0000 mg | Freq: Once | INTRAMUSCULAR | Status: AC
  Administered 2013-01-06: 10 mg via INTRAVENOUS
  Filled 2013-01-06: qty 1

## 2013-01-06 MED ORDER — DIPHENHYDRAMINE HCL 50 MG/ML IJ SOLN
50.0000 mg | Freq: Once | INTRAMUSCULAR | Status: AC
  Administered 2013-01-06: 50 mg via INTRAVENOUS
  Filled 2013-01-06: qty 1

## 2013-01-06 MED ORDER — AMLODIPINE BESYLATE 5 MG PO TABS: 5.0000 mg | ORAL_TABLET | Freq: Every day | ORAL | Status: DC

## 2013-01-06 MED ORDER — DOXAZOSIN MESYLATE 4 MG PO TABS: 4.0000 mg | ORAL_TABLET | Freq: Every day | ORAL | Status: DC

## 2013-01-06 MED ORDER — METHYLPREDNISOLONE SODIUM SUCC 125 MG IJ SOLR: 125.0000 mg | Freq: Once | INTRAMUSCULAR | Status: DC

## 2013-01-06 MED ORDER — SODIUM CHLORIDE 0.9 % IV SOLN: Freq: Once | INTRAVENOUS | Status: DC

## 2013-01-06 NOTE — ED Provider Notes (Signed)
History     CSN: [id number]  Arrival date & time 01/06/13  0309   First MD Initiated Contact with Patient 01/06/13 0449      Chief Complaint  Patient presents with  . Lip Swelling     (Consider location/radiation/quality/duration/timing/severity/associated sxs/prior treatment) HPI This 79 year old black male who went to bed yesterday evening with a slight swelling of his upper lip. He woke this morning to go to the bathroom and noticed that his upper lip with significantly swollen, more than twice its normal size. There was no associated pain or itching. He denies tongue or throat swelling. He denies breathing difficulty. He has not had any nausea, vomiting or diarrhea. He has never had ACE inhibitor-induced angioedema in the past. His only recent medication change was being placed on Hycodan syrup for cough. His wife states it has improved somewhat since his arrival at the hospital. He was given Benadryl, Pepcid and dexamethasone prior to my evaluation.  Past Medical History  Diagnosis Date  . HYPERLIPIDEMIA 01/28/2010  . HYPERTENSION 01/28/2010  . HYPERTROPHY PROSTATE W/UR OBST & OTH LUTS 01/28/2010  . CAD (coronary artery disease)   . Arthritis   . Glaucoma(365)     Past Surgical History  Procedure Date  . Angioplasty 2004  . Cervical disc surgery 2004  . Hernia repair 2006  . Prostate surgery 2009    bx  . US echocardiography 08-04-2008    Est EF 55-60%  . Cardiovascular stress test 08-04-2008    EF 0%  . Prostate biopsy 03/2008    Family History  Problem Relation Age of Onset  . Heart disease Mother   . Heart disease Father   . Aneurysm Brother     History  Substance Use Topics  . Smoking status: Former Smoker    Quit date: 12/25/1978  . Smokeless tobacco: Never Used  . Alcohol Use: No      Review of Systems  All other systems reviewed and are negative.    Allergies  Review of patient's allergies indicates no known allergies.  Home Medications    Current Outpatient Rx  Name  Route  Sig  Dispense  Refill  . AMLODIPINE BESY-BENAZEPRIL HCL 5-20 MG PO CAPS   Oral   Take 1 capsule by mouth every morning.         . ASPIRIN EC 325 MG PO TBEC   Oral   Take 325 mg by mouth every morning.         Marland Kitchen DOXAZOSIN MESYLATE 4 MG PO TABS   Oral   Take 4 mg by mouth at bedtime.          Marland Kitchen FLUTICASONE PROPIONATE 50 MCG/ACT NA SUSP   Nasal   Place 2 sprays into the nose daily.   16 g   11   . HYDROCHLOROTHIAZIDE 12.5 MG PO CAPS   Oral   Take 12.5 mg by mouth every morning.          Marland Kitchen HYDROCODONE-HOMATROPINE 5-1.5 MG/5ML PO SYRP   Oral   Take 5 mLs by mouth every 6 (six) hours as needed.         Marland Kitchen MENS 50+ MULTI VITAMIN/MIN PO   Oral   Take 1 tablet by mouth daily.          Marland Kitchen SIMVASTATIN 20 MG PO TABS   Oral   Take 20 mg by mouth at bedtime.           BP 171/66  Pulse 74  Temp 98.2 F (36.8 C) (Oral)  Resp 20  SpO2 100%  Physical Exam General: Well-developed, well-nourished male in no acute distress; appearance consistent with age of record HENT: normocephalic, atraumatic; no tongue or pharyngeal edema; no dysphonia; angioedema of the upper lip Eyes: pupils equal round and reactive to light; extraocular muscles intact; arcus senilis bilaterally Neck: supple Heart: regular rate and rhythm Lungs: clear to auscultation bilaterally Abdomen: soft; nondistended Extremities: No deformity; normal range of motion Neurologic: Awake, alert and oriented; motor function intact in all extremities and symmetric; no facial droop Skin: Warm and dry Psychiatric: Normal mood and affect    ED Course  Procedures (including critical care time)     MDM   Nursing notes and vitals signs, including pulse oximetry, reviewed.  Summary of this visit's results, reviewed by myself:  Labs:  Results for orders placed during the hospital encounter of 01/06/13 (from the past 24 hour(s))  POCT I-STAT, CHEM 8     Status:  Abnormal   Collection Time   01/06/13  3:54 AM      Component Value Range   Sodium 142  135 - 145 mEq/L   Potassium 3.8  3.5 - 5.1 mEq/L   Chloride 105  96 - 112 mEq/L   BUN 21  6 - 23 mg/dL   Creatinine, Ser 4.09 (*) 0.50 - 1.35 mg/dL   Glucose, Bld 88  70 - 99 mg/dL   Calcium, Ion 8.11  9.14 - 1.30 mmol/L   TCO2 28  0 - 100 mmol/L   Hemoglobin 11.2 (*) 13.0 - 17.0 g/dL   HCT 78.2 (*) 95.6 - 21.3 %    6:53 AM Patient continues to be in no discomfort or distress. Angioedema has resolved somewhat, notably on the left side of the lip. He was advised to discontinue his enalapril. We will prescribe plain amlodipine. Patient was advised to contact his primary care physician about this today. He was advised to call 911 should he experience worsening edema or any difficulty breathing.         Hanley Seamen, MD 01/06/13 [phone]

## 2013-01-06 NOTE — Patient Instructions (Addendum)
Angioedema Angioedema (AE) is a sudden swelling of the eyelids, lips, lobes of ears, external genitalia, skin, and other parts of the body. AE can happen by itself. It usually begins during the night and is found on awakening. It can happen with hives and other allergic reactions. Attacks can be mild and annoying, or life-threatening if the air passages swell. AE generally occurs in a short time period (over minutes to hours) and gets better in 24 to 48 hours. It usually does not cause any serious problems.  There are 2 different kinds of AE:   Allergic AE.  Nonallergic AE.  There may be an overreaction or direct stimulation of cells that are a part of the immune system (mast cells).  There may be problems with the release of chemicals made by the body that cause swelling and inflammation (kinins). AE due to kinins can be inherited from parents (hereditary), or it can develop on its own (acquired). Acquired AE either shows up before, or along with, certain diseases or is due to the body's immune system attacking parts of the body's own cells (autoimmune). CAUSES  Allergic  AE due to allergic reactions are caused by something that causes the body to react (trigger). Common triggers include:  Foods.  Medicines.  Latex.  Direct contact with certain fruits, vegetables, or animal saliva.  Insect stings. Nonallergic  Mast cell stimulation may be caused by:  Medicines.  Dyes used in X-rays.  The body's own immune system reactions to parts of the body (autoimmune disease).  Possibly, some virus infections.  AE due to problems with kinins can be hereditary or acquired. Attacks are triggered by:  Mild injury.  Dental work or any surgery.  Stress.  Sudden changes in temperature.  Exercise.  Medicines.  AE due to problems with kinins can also be due to certain medicines, especially blood pressure medicines like angiotensin-converting enzyme (ACE) inhibitors. African Americans  are at nearly 5 times greater risk of developing AE than Caucasians from ACE inhibitors. SYMPTOMS  Allergic symptoms:  Non-itchy swelling of the skin. Often the swelling is on the face and lips, but any area of the skin can swell. Sometimes, the swelling can be painful. If hives are present, there is intense itching.  Breathing problems if the air passages swell. Nonallergic symptoms:  If internal organs are involved, there may be:  Nausea.  Abdominal pain.  Vomiting.  Difficulty swallowing.  Difficulty passing urine.  Breathing problems if the air passages swell. Depending on the cause of AE, episodes may:  Only happen once (if triggers are removed or avoided).  Come back in unpredictable patterns.  Repeat for several years and then gradually fade away. DIAGNOSIS  AE is diagnosed by:   Asking questions to find out how fast the symptoms began.  Taking a family history.  Physical exam.  Diagnostic tests. Tests could include:  Allergy skin tests to see if the problem is allergic.  Blood tests to diagnose hereditary and some acquired types of AE.  Other tests to see if there is a hidden disease leading to the AE. TREATMENT  Treatment depends on the type and cause (if any) of the AE. Allergic  Allergic types of AE are treated with:  Immediate removal of the trigger or medicine (if any).  Epinephrine injection.  Steroids.  Antihistamines.  Hospitalization for severe attacks. Nonallergic  Mast cell stimulation types of AE are treated with:  Immediate removal of the trigger or medicine (if any).  Epinephrine injection.    Allergic   Allergic types of AE are treated with:   Immediate removal of the trigger or medicine (if any).   Epinephrine injection.   Steroids.   Antihistamines.   Hospitalization for severe attacks.  Nonallergic   Mast cell stimulation types of AE are treated with:   Immediate removal of the trigger or medicine (if any).   Epinephrine injection.   Steroids.   Antihistamines.   Hospitalization for severe attacks.   Hereditary AE is treated with:   Medicines to prevent and treat attacks. There is little response to antihistamines, epinephrine, or steroids.   Preventive medicines before dental work or surgery.   Removing or avoiding medicines that trigger  attacks.   Hospitalization for severe attacks.   Acquired AE is treated with:   Treating underlying disease (if any).   Medicines to prevent and treat attacks.  HOME CARE INSTRUCTIONS    Always carry your emergency allergy treatment medicines with you.   Wear a medical bracelet.   Avoid known triggers.  SEEK MEDICAL CARE IF:    You get repeat attacks.   Your attacks are more frequent or more severe despite preventive measures.   You have hereditary AE and are considering having children. It is important to discuss the risks of passing this on to your children.  SEEK IMMEDIATE MEDICAL CARE IF:    You have difficulty breathing.   You have difficulty swallowing.   You experience fainting.  This condition should be treated immediately. It can be life-threatening if it involves throat swelling.  Document Released: 02/19/2002 Document Revised: 03/04/2012 Document Reviewed: 12/10/2008  [employer] Patient Information 2013 [employer].

## 2013-01-06 NOTE — Telephone Encounter (Signed)
Pt made appt for 1/13. Encounter closed.

## 2013-01-06 NOTE — ED Notes (Signed)
Pt states started with minimal lip swelling last night; woke up this morning with gross swelling upper lip; denies tongue swelling; denies any itching in throat or respiratory difficulty

## 2013-01-06 NOTE — Telephone Encounter (Signed)
Patient Information:  Caller Name: Ajmal  Phone: [phone]  Patient: Joshua Burns, Joshua Burns  Gender: Male  DOB: 16-Jan-1925  Age: 79 Years  PCP: Evelena Peat [facility])  Office Follow Up:  Does the office need to follow up with this patient?: Yes  Instructions For The Office: Please review ED records from 01/06/13.  Benazepril discontinued.  Needs to know if MD wants him on another BP medication.  Declined appointment, but will come in if MD prefers that.  Confirmed pharmacy preference as [facility] on [address].  During the final documentation process patient states preference to be seen; only one 15 minute appointment available earlier in the day;  Scheduled for 1600 and 16:30 appointments to allow adequate time for assessment/ follow up; was not able to schedule adequate time otherwise.     Symptoms  Reason For Call & Symptoms: Seen in ED 01/06/13 for upper lip swelling 3 times normal size.  He was advised to call office. He was instructed to stop Benazepril.  He reports the lip swelling is subsiding; he was given  IV meds in ED to help with this. Note to office for follow up regarding possible replacement of Benazepril to control BP per caller request.  He declined appointment for today.  Reviewed Health History In EMR: Yes  Reviewed Medications In EMR: Yes  Reviewed Allergies In EMR: Yes  Reviewed Surgeries / Procedures: Yes  Date of Onset of Symptoms: 01/06/2013  Treatments Tried: Robitussin - reduced nasal congestion  Treatments Tried Worked: Yes  Guideline(s) Used:  No Protocol Available - Sick Adult  Disposition Per Guideline:   Discuss with PCP and Callback by Nurse within 1 Hour  Reason For Disposition Reached:   Nursing judgment  Advice Given:  Call Back If:  New symptoms develop  You become worse.

## 2013-01-06 NOTE — ED Notes (Signed)
Lab draw: dark green top x 1

## 2013-01-06 NOTE — Progress Notes (Signed)
  Subjective:    Patient ID: Joshua Burns, male    DOB: 1925-01-11, 79 y.o.   MRN: [id number]  HPI Emergency room followup. Patient last night developed upper lip angioedema-type symptoms. He has been on ACE inhibitor for several years. He had a dinner consisting of chicken and potatoes and he's never had any food allergies. Went to emergency department. Was prescribed Benadryl, Pepcid, and dexamethasone and symptoms improved somewhat during ED visit. No tongue edema. No dyspnea. No cough. No wheezing.  Patient had Lotrel discontinued and given prescription for plain amlodipine 5 mg. He also remains on HCTZ 12.5 mg daily and doxazosin 4 mg at night. Has never had previous angioedema. No chest pains. No dizziness.  Past Medical History  Diagnosis Date  . HYPERLIPIDEMIA 01/28/2010  . HYPERTENSION 01/28/2010  . HYPERTROPHY PROSTATE W/UR OBST & OTH LUTS 01/28/2010  . CAD (coronary artery disease)   . Arthritis   . Glaucoma(365)    Past Surgical History  Procedure Date  . Angioplasty 2004  . Cervical disc surgery 2004  . Hernia repair 2006  . Prostate surgery 2009    bx  . US echocardiography 08-04-2008    Est EF 55-60%  . Cardiovascular stress test 08-04-2008    EF 0%  . Prostate biopsy 03/2008    reports that he quit smoking about 34 years ago. He has never used smokeless tobacco. He reports that he does not drink alcohol or use illicit drugs. family history includes Aneurysm in his brother and Heart disease in his father and mother. Allergies  Allergen Reactions  . Ace Inhibitors Swelling      Review of Systems  Constitutional: Negative for fever, chills and appetite change.  HENT: Positive for facial swelling. Negative for trouble swallowing and voice change.   Respiratory: Negative for cough, shortness of breath, wheezing and stridor.   Cardiovascular: Negative for chest pain, palpitations and leg swelling.  Gastrointestinal: Negative for abdominal pain.  Neurological: Negative  for dizziness and headaches.       Objective:   Physical Exam  Constitutional: He appears well-developed and well-nourished.  HENT:  Right Ear: External ear normal.  Left Ear: External ear normal.       Patient has some edema involving the upper lip. No lower lip edema. No tongue edema. No posterior pharynx edema  Neck: Neck supple. No thyromegaly present.  Cardiovascular: Normal rate and regular rhythm.   Pulmonary/Chest: Effort normal and breath sounds normal. No respiratory distress. He has no wheezes. He has no rales.  Lymphadenopathy:    He has no cervical adenopathy.  Skin: No rash noted.          Assessment & Plan:  Angioedema reaction localized upper lip and a patient with chronic ACE inhibitor use. No other clear or likely precipitants. Discontinue benazepril. No further ACE inhibitor use. Wrote for plain amlodipine 10 mg daily and continue HCTZ and doxazosin. He is encouraged to take another dose of Benadryl tonight. Followup immediately for any recurrent swelling. Reassess blood pressure one month

## 2013-02-13 ENCOUNTER — Encounter: Payer: Self-pay | Admitting: Family Medicine

## 2013-02-13 ENCOUNTER — Ambulatory Visit (INDEPENDENT_AMBULATORY_CARE_PROVIDER_SITE_OTHER): Payer: Medicare Other | Admitting: Family Medicine

## 2013-02-13 VITALS — BP 130/64 | Temp 98.4°F | Wt 165.0 lb

## 2013-02-13 DIAGNOSIS — I10 Essential (primary) hypertension: Secondary | ICD-10-CM

## 2013-02-13 NOTE — Progress Notes (Signed)
  Subjective:    Patient ID: Joshua Burns, male    DOB: 11/05/1925, 79 y.o.   MRN: [id number]  HPI Followup hypertension.  Patient back in January developed angioedema probably related ACE inhibitor. Switched to amlodipine has done extremely well since that time. Also remains on HCTZ and Cardura. No side effects. Only minimal trace edema legs date. No dizziness. No dyspnea. No chest pain. No headaches.  Past Medical History  Diagnosis Date  . HYPERLIPIDEMIA 01/28/2010  . HYPERTENSION 01/28/2010  . HYPERTROPHY PROSTATE W/UR OBST & OTH LUTS 01/28/2010  . CAD (coronary artery disease)   . Arthritis   . Glaucoma(365)    Past Surgical History  Procedure Laterality Date  . Angioplasty  2004  . Cervical disc surgery  2004  . Hernia repair  2006  . Prostate surgery  2009    bx  . US echocardiography  08-04-2008    Est EF 55-60%  . Cardiovascular stress test  08-04-2008    EF 0%  . Prostate biopsy  03/2008    reports that he quit smoking about 34 years ago. He has never used smokeless tobacco. He reports that he does not drink alcohol or use illicit drugs. family history includes Aneurysm in his brother and Heart disease in his father and mother. Allergies  Allergen Reactions  . Ace Inhibitors Swelling      Review of Systems  Constitutional: Negative for fatigue.  Eyes: Negative for visual disturbance.  Respiratory: Negative for cough, chest tightness and shortness of breath.   Cardiovascular: Negative for chest pain, palpitations and leg swelling.  Neurological: Negative for dizziness, syncope, weakness, light-headedness and headaches.       Objective:   Physical Exam  Constitutional: He appears well-developed and well-nourished.  Neck: Neck supple. No thyromegaly present.  Cardiovascular: Normal rate and regular rhythm.  Exam reveals no gallop.   Pulmonary/Chest: Effort normal and breath sounds normal. No respiratory distress. He has no wheezes. He has no rales.   Musculoskeletal:  Only trace edema lower legs bilaterally  Lymphadenopathy:    He has no cervical adenopathy.          Assessment & Plan:  Hypertension. Stable and at goal. Recent angioedema probably related to ACE inhibitor use. Avoid further use of ACE inhibitor or angiotensin receptor blockers. Routine followup 6 months

## 2013-03-18 DIAGNOSIS — H04129 Dry eye syndrome of unspecified lacrimal gland: Secondary | ICD-10-CM | POA: Diagnosis not present

## 2013-03-18 DIAGNOSIS — H35039 Hypertensive retinopathy, unspecified eye: Secondary | ICD-10-CM | POA: Diagnosis not present

## 2013-03-18 DIAGNOSIS — H40019 Open angle with borderline findings, low risk, unspecified eye: Secondary | ICD-10-CM | POA: Diagnosis not present

## 2013-03-18 DIAGNOSIS — H2510 Age-related nuclear cataract, unspecified eye: Secondary | ICD-10-CM | POA: Diagnosis not present

## 2013-03-28 ENCOUNTER — Encounter: Payer: Self-pay | Admitting: Cardiology

## 2013-03-31 DIAGNOSIS — H2510 Age-related nuclear cataract, unspecified eye: Secondary | ICD-10-CM | POA: Diagnosis not present

## 2013-03-31 DIAGNOSIS — H35039 Hypertensive retinopathy, unspecified eye: Secondary | ICD-10-CM | POA: Diagnosis not present

## 2013-03-31 DIAGNOSIS — H40019 Open angle with borderline findings, low risk, unspecified eye: Secondary | ICD-10-CM | POA: Diagnosis not present

## 2013-03-31 DIAGNOSIS — H04129 Dry eye syndrome of unspecified lacrimal gland: Secondary | ICD-10-CM | POA: Diagnosis not present

## 2013-04-15 DIAGNOSIS — H269 Unspecified cataract: Secondary | ICD-10-CM | POA: Diagnosis not present

## 2013-04-15 DIAGNOSIS — H2510 Age-related nuclear cataract, unspecified eye: Secondary | ICD-10-CM | POA: Diagnosis not present

## 2013-05-15 DIAGNOSIS — H2510 Age-related nuclear cataract, unspecified eye: Secondary | ICD-10-CM | POA: Diagnosis not present

## 2013-05-27 DIAGNOSIS — H269 Unspecified cataract: Secondary | ICD-10-CM | POA: Diagnosis not present

## 2013-05-27 DIAGNOSIS — H2510 Age-related nuclear cataract, unspecified eye: Secondary | ICD-10-CM | POA: Diagnosis not present

## 2013-06-06 ENCOUNTER — Encounter (INDEPENDENT_AMBULATORY_CARE_PROVIDER_SITE_OTHER): Payer: Medicare Other | Admitting: Ophthalmology

## 2013-06-06 DIAGNOSIS — H35039 Hypertensive retinopathy, unspecified eye: Secondary | ICD-10-CM | POA: Diagnosis not present

## 2013-06-06 DIAGNOSIS — H35359 Cystoid macular degeneration, unspecified eye: Secondary | ICD-10-CM

## 2013-06-06 DIAGNOSIS — H43819 Vitreous degeneration, unspecified eye: Secondary | ICD-10-CM | POA: Diagnosis not present

## 2013-06-06 DIAGNOSIS — I10 Essential (primary) hypertension: Secondary | ICD-10-CM | POA: Diagnosis not present

## 2013-07-02 ENCOUNTER — Ambulatory Visit: Payer: Medicare Other | Admitting: Family Medicine

## 2013-07-18 ENCOUNTER — Encounter (INDEPENDENT_AMBULATORY_CARE_PROVIDER_SITE_OTHER): Payer: Medicare Other | Admitting: Ophthalmology

## 2013-07-18 DIAGNOSIS — H43819 Vitreous degeneration, unspecified eye: Secondary | ICD-10-CM

## 2013-07-18 DIAGNOSIS — H35039 Hypertensive retinopathy, unspecified eye: Secondary | ICD-10-CM | POA: Diagnosis not present

## 2013-07-18 DIAGNOSIS — I10 Essential (primary) hypertension: Secondary | ICD-10-CM | POA: Diagnosis not present

## 2013-07-18 DIAGNOSIS — H35359 Cystoid macular degeneration, unspecified eye: Secondary | ICD-10-CM | POA: Diagnosis not present

## 2013-07-28 DIAGNOSIS — H40019 Open angle with borderline findings, low risk, unspecified eye: Secondary | ICD-10-CM | POA: Diagnosis not present

## 2013-07-28 DIAGNOSIS — H35359 Cystoid macular degeneration, unspecified eye: Secondary | ICD-10-CM | POA: Diagnosis not present

## 2013-07-28 DIAGNOSIS — H04129 Dry eye syndrome of unspecified lacrimal gland: Secondary | ICD-10-CM | POA: Diagnosis not present

## 2013-08-13 ENCOUNTER — Encounter: Payer: Self-pay | Admitting: Family Medicine

## 2013-08-13 ENCOUNTER — Ambulatory Visit (INDEPENDENT_AMBULATORY_CARE_PROVIDER_SITE_OTHER): Payer: Medicare Other | Admitting: Family Medicine

## 2013-08-13 VITALS — BP 138/62 | HR 65 | Temp 97.7°F | Wt 168.0 lb

## 2013-08-13 DIAGNOSIS — I2510 Atherosclerotic heart disease of native coronary artery without angina pectoris: Secondary | ICD-10-CM

## 2013-08-13 DIAGNOSIS — I10 Essential (primary) hypertension: Secondary | ICD-10-CM | POA: Diagnosis not present

## 2013-08-13 DIAGNOSIS — R609 Edema, unspecified: Secondary | ICD-10-CM | POA: Diagnosis not present

## 2013-08-13 DIAGNOSIS — E785 Hyperlipidemia, unspecified: Secondary | ICD-10-CM

## 2013-08-13 DIAGNOSIS — R600 Localized edema: Secondary | ICD-10-CM

## 2013-08-13 LAB — HEPATIC FUNCTION PANEL
Alkaline Phosphatase: 49 U/L (ref 39–117)
Bilirubin, Direct: 0.1 mg/dL (ref 0.0–0.3)
Total Bilirubin: 0.6 mg/dL (ref 0.3–1.2)

## 2013-08-13 LAB — BASIC METABOLIC PANEL
Calcium: 9.6 mg/dL (ref 8.4–10.5)
Creatinine, Ser: 1.2 mg/dL (ref 0.4–1.5)
GFR: 72.35 mL/min (ref >60.00)
Sodium: 141 mEq/L (ref 135–145)

## 2013-08-13 LAB — LIPID PANEL
HDL: 42.1 mg/dL (ref >39.00)
LDL Cholesterol: 83 mg/dL (ref 0–99)
Total CHOL/HDL Ratio: 3
Triglycerides: 45 mg/dL (ref 0.0–149.0)

## 2013-08-13 MED ORDER — AMLODIPINE BESYLATE 5 MG PO TABS: 5.0000 mg | ORAL_TABLET | Freq: Every day | ORAL | Status: DC

## 2013-08-13 NOTE — Progress Notes (Signed)
  Subjective:    Patient ID: Joshua Burns, male    DOB: 09/22/1925, 79 y.o.   MRN: [id number]  HPI Patient here for followup regarding hypertension, history of CAD, and hyperlipidemia Several months ago, he had probable ACE inhibitor or-related angioedema. He was taken off ACE inhibitor and started on amlodipine 10 mg daily per emergency room. His blood pressures been well controlled he had some progressive edema issues recently. Denies any orthopnea or PND.  Blood pressures have been well controlled by home readings  No recent chest pains. Fairly sedentary. Takes simvastatin for hyperlipidemia. He also sister some BPH. Symptomatically stable on doxazosin No orthostasis  Past Medical History  Diagnosis Date  . HYPERLIPIDEMIA 01/28/2010  . HYPERTENSION 01/28/2010  . HYPERTROPHY PROSTATE W/UR OBST & OTH LUTS 01/28/2010  . CAD (coronary artery disease)   . Arthritis   . Glaucoma    Past Surgical History  Procedure Laterality Date  . Angioplasty  2004  . Cervical disc surgery  2004  . Hernia repair  2006  . Prostate surgery  2009    bx  . US echocardiography  08-04-2008    Est EF 55-60%  . Cardiovascular stress test  08-04-2008    EF 0%  . Prostate biopsy  03/2008    reports that he quit smoking about 34 years ago. He has never used smokeless tobacco. He reports that he does not drink alcohol or use illicit drugs. family history includes Aneurysm in his brother; Heart disease in his father and mother. Allergies  Allergen Reactions  . Ace Inhibitors Swelling     Review of Systems  Constitutional: Positive for fatigue. Negative for appetite change and unexpected weight change.  Eyes: Negative for visual disturbance.  Respiratory: Negative for cough, chest tightness and shortness of breath.   Cardiovascular: Positive for leg swelling. Negative for chest pain and palpitations.  Genitourinary: Negative for dysuria and decreased urine volume.  Neurological: Negative for dizziness,  syncope, weakness, light-headedness and headaches.       Objective:   Physical Exam  Constitutional: He is oriented to person, place, and time. He appears well-developed and well-nourished.  Neck: Neck supple. No thyromegaly present.  Cardiovascular: Normal rate and regular rhythm.   Pulmonary/Chest: Effort normal and breath sounds normal. No respiratory distress. He has no wheezes. He has no rales.  Musculoskeletal: He exhibits edema.  Trace edema legs bilaterally  Neurological: He is alert and oriented to person, place, and time.          Assessment & Plan:  #1 hypertension stable #2 bilateral leg edema very likely related to high dose amlodipine. Try reducing amlodipine to 5 mg daily and monitor blood pressure closely. Touch base 3-4 weeks if edema not improving with decreased dosage or if blood pressure increasing consistently over 140/90 #3 hyperlipidemia. Check lipid and hepatic panel

## 2013-08-28 DIAGNOSIS — H40019 Open angle with borderline findings, low risk, unspecified eye: Secondary | ICD-10-CM | POA: Diagnosis not present

## 2013-08-28 DIAGNOSIS — H04129 Dry eye syndrome of unspecified lacrimal gland: Secondary | ICD-10-CM | POA: Diagnosis not present

## 2013-08-28 DIAGNOSIS — H0019 Chalazion unspecified eye, unspecified eyelid: Secondary | ICD-10-CM | POA: Diagnosis not present

## 2013-09-16 DIAGNOSIS — H0019 Chalazion unspecified eye, unspecified eyelid: Secondary | ICD-10-CM | POA: Diagnosis not present

## 2013-09-16 DIAGNOSIS — H35359 Cystoid macular degeneration, unspecified eye: Secondary | ICD-10-CM | POA: Diagnosis not present

## 2013-09-16 DIAGNOSIS — H402290 Chronic angle-closure glaucoma, unspecified eye, stage unspecified: Secondary | ICD-10-CM | POA: Diagnosis not present

## 2013-09-16 DIAGNOSIS — H40019 Open angle with borderline findings, low risk, unspecified eye: Secondary | ICD-10-CM | POA: Diagnosis not present

## 2013-09-16 DIAGNOSIS — H04129 Dry eye syndrome of unspecified lacrimal gland: Secondary | ICD-10-CM | POA: Diagnosis not present

## 2013-09-16 DIAGNOSIS — H348392 Tributary (branch) retinal vein occlusion, unspecified eye, stable: Secondary | ICD-10-CM | POA: Diagnosis not present

## 2013-09-16 DIAGNOSIS — Z961 Presence of intraocular lens: Secondary | ICD-10-CM | POA: Diagnosis not present

## 2013-09-17 ENCOUNTER — Encounter (INDEPENDENT_AMBULATORY_CARE_PROVIDER_SITE_OTHER): Payer: Medicare Other | Admitting: Ophthalmology

## 2013-09-17 DIAGNOSIS — H3530 Unspecified macular degeneration: Secondary | ICD-10-CM

## 2013-09-17 DIAGNOSIS — H35039 Hypertensive retinopathy, unspecified eye: Secondary | ICD-10-CM | POA: Diagnosis not present

## 2013-09-17 DIAGNOSIS — I10 Essential (primary) hypertension: Secondary | ICD-10-CM

## 2013-09-17 DIAGNOSIS — H35359 Cystoid macular degeneration, unspecified eye: Secondary | ICD-10-CM

## 2013-09-17 DIAGNOSIS — H43819 Vitreous degeneration, unspecified eye: Secondary | ICD-10-CM

## 2013-09-29 DIAGNOSIS — H04129 Dry eye syndrome of unspecified lacrimal gland: Secondary | ICD-10-CM | POA: Diagnosis not present

## 2013-09-29 DIAGNOSIS — H40019 Open angle with borderline findings, low risk, unspecified eye: Secondary | ICD-10-CM | POA: Diagnosis not present

## 2013-09-29 DIAGNOSIS — H35359 Cystoid macular degeneration, unspecified eye: Secondary | ICD-10-CM | POA: Diagnosis not present

## 2013-09-29 DIAGNOSIS — Z23 Encounter for immunization: Secondary | ICD-10-CM | POA: Diagnosis not present

## 2013-09-29 DIAGNOSIS — H00029 Hordeolum internum unspecified eye, unspecified eyelid: Secondary | ICD-10-CM | POA: Diagnosis not present

## 2013-10-29 ENCOUNTER — Encounter (INDEPENDENT_AMBULATORY_CARE_PROVIDER_SITE_OTHER): Payer: Medicare Other | Admitting: Ophthalmology

## 2013-10-29 DIAGNOSIS — H43819 Vitreous degeneration, unspecified eye: Secondary | ICD-10-CM | POA: Diagnosis not present

## 2013-10-29 DIAGNOSIS — H35039 Hypertensive retinopathy, unspecified eye: Secondary | ICD-10-CM | POA: Diagnosis not present

## 2013-10-29 DIAGNOSIS — H35359 Cystoid macular degeneration, unspecified eye: Secondary | ICD-10-CM | POA: Diagnosis not present

## 2013-10-29 DIAGNOSIS — I10 Essential (primary) hypertension: Secondary | ICD-10-CM | POA: Diagnosis not present

## 2013-11-06 ENCOUNTER — Encounter: Payer: Self-pay | Admitting: Family Medicine

## 2013-11-06 ENCOUNTER — Ambulatory Visit (INDEPENDENT_AMBULATORY_CARE_PROVIDER_SITE_OTHER): Payer: Medicare Other | Admitting: Family Medicine

## 2013-11-06 VITALS — BP 134/70 | HR 61 | Temp 97.5°F | Wt 163.0 lb

## 2013-11-06 DIAGNOSIS — I10 Essential (primary) hypertension: Secondary | ICD-10-CM | POA: Diagnosis not present

## 2013-11-06 NOTE — Progress Notes (Signed)
  Subjective:    Patient ID: Joshua Burns, male    DOB: 1925-10-31, 79 y.o.   MRN: [id number]  HPI Patient seen for routine medical followup. Last visit he had increased peripheral edema and we reduced his amlodipine from 10 mg to 5 mg. He remains on  Thiazide diuretic. Hyperlipidemia treated with simvastatin. He has no complaints today other than he is recuperating from a recent cold. He has some mild nasal congestion and very minimal cough. No fevers or chills. Generally feels well. Compliant with all medications. No chest pains or dizziness. Flu vaccine already received.  Past Medical History  Diagnosis Date  . HYPERLIPIDEMIA 01/28/2010  . HYPERTENSION 01/28/2010  . HYPERTROPHY PROSTATE W/UR OBST & OTH LUTS 01/28/2010  . CAD (coronary artery disease)   . Arthritis   . Glaucoma    Past Surgical History  Procedure Laterality Date  . Angioplasty  2004  . Cervical disc surgery  2004  . Hernia repair  2006  . Prostate surgery  2009    bx  . US echocardiography  08-04-2008    Est EF 55-60%  . Cardiovascular stress test  08-04-2008    EF 0%  . Prostate biopsy  03/2008    reports that he quit smoking about 34 years ago. He has never used smokeless tobacco. He reports that he does not drink alcohol or use illicit drugs. family history includes Aneurysm in his brother; Heart disease in his father and mother. Allergies  Allergen Reactions  . Ace Inhibitors Swelling      Review of Systems  Constitutional: Negative for fatigue.  Eyes: Negative for visual disturbance.  Respiratory: Negative for cough, chest tightness and shortness of breath.   Cardiovascular: Negative for chest pain, palpitations and leg swelling.  Endocrine: Negative for polydipsia and polyuria.  Neurological: Negative for dizziness, syncope, weakness, light-headedness and headaches.       Objective:   Physical Exam  Constitutional: He appears well-developed and well-nourished.  Neck: Neck supple. No thyromegaly  present.  Cardiovascular: Normal rate and regular rhythm.   Pulmonary/Chest: Effort normal and breath sounds normal. No respiratory distress. He has no wheezes. He has no rales.  Musculoskeletal: He exhibits no edema.          Assessment & Plan:  Hypertension. Well controlled. Peripheral edema improved with reduction of amlodipine dosage. Routine followup 6 months

## 2013-11-06 NOTE — Progress Notes (Signed)
Pre visit review using our clinic review tool, if applicable. No additional management support is needed unless otherwise documented below in the visit note. 

## 2013-11-27 DIAGNOSIS — H35039 Hypertensive retinopathy, unspecified eye: Secondary | ICD-10-CM | POA: Diagnosis not present

## 2013-11-27 DIAGNOSIS — H35359 Cystoid macular degeneration, unspecified eye: Secondary | ICD-10-CM | POA: Diagnosis not present

## 2013-11-27 DIAGNOSIS — H40019 Open angle with borderline findings, low risk, unspecified eye: Secondary | ICD-10-CM | POA: Diagnosis not present

## 2013-11-27 DIAGNOSIS — H35049 Retinal micro-aneurysms, unspecified, unspecified eye: Secondary | ICD-10-CM | POA: Diagnosis not present

## 2013-11-27 DIAGNOSIS — Z961 Presence of intraocular lens: Secondary | ICD-10-CM | POA: Diagnosis not present

## 2013-11-27 DIAGNOSIS — H43819 Vitreous degeneration, unspecified eye: Secondary | ICD-10-CM | POA: Diagnosis not present

## 2013-12-10 ENCOUNTER — Encounter (INDEPENDENT_AMBULATORY_CARE_PROVIDER_SITE_OTHER): Payer: Medicare Other | Admitting: Ophthalmology

## 2013-12-10 DIAGNOSIS — I10 Essential (primary) hypertension: Secondary | ICD-10-CM | POA: Diagnosis not present

## 2013-12-10 DIAGNOSIS — H35039 Hypertensive retinopathy, unspecified eye: Secondary | ICD-10-CM | POA: Diagnosis not present

## 2013-12-10 DIAGNOSIS — H43819 Vitreous degeneration, unspecified eye: Secondary | ICD-10-CM

## 2013-12-10 DIAGNOSIS — H35359 Cystoid macular degeneration, unspecified eye: Secondary | ICD-10-CM | POA: Diagnosis not present

## 2014-01-14 DIAGNOSIS — M5137 Other intervertebral disc degeneration, lumbosacral region: Secondary | ICD-10-CM | POA: Diagnosis not present

## 2014-01-21 ENCOUNTER — Encounter (INDEPENDENT_AMBULATORY_CARE_PROVIDER_SITE_OTHER): Payer: Medicare Other | Admitting: Ophthalmology

## 2014-01-21 DIAGNOSIS — H43819 Vitreous degeneration, unspecified eye: Secondary | ICD-10-CM

## 2014-01-21 DIAGNOSIS — H35359 Cystoid macular degeneration, unspecified eye: Secondary | ICD-10-CM

## 2014-01-21 DIAGNOSIS — H35039 Hypertensive retinopathy, unspecified eye: Secondary | ICD-10-CM

## 2014-01-21 DIAGNOSIS — I10 Essential (primary) hypertension: Secondary | ICD-10-CM

## 2014-01-23 ENCOUNTER — Other Ambulatory Visit: Payer: Self-pay | Admitting: Family Medicine

## 2014-01-28 DIAGNOSIS — M545 Low back pain, unspecified: Secondary | ICD-10-CM | POA: Diagnosis not present

## 2014-02-03 ENCOUNTER — Other Ambulatory Visit: Payer: Self-pay | Admitting: Family Medicine

## 2014-02-11 DIAGNOSIS — M545 Low back pain, unspecified: Secondary | ICD-10-CM | POA: Diagnosis not present

## 2014-02-11 DIAGNOSIS — M5137 Other intervertebral disc degeneration, lumbosacral region: Secondary | ICD-10-CM | POA: Diagnosis not present

## 2014-02-25 DIAGNOSIS — M545 Low back pain, unspecified: Secondary | ICD-10-CM | POA: Diagnosis not present

## 2014-03-11 DIAGNOSIS — M545 Low back pain, unspecified: Secondary | ICD-10-CM | POA: Diagnosis not present

## 2014-03-11 DIAGNOSIS — M5137 Other intervertebral disc degeneration, lumbosacral region: Secondary | ICD-10-CM | POA: Diagnosis not present

## 2014-04-07 DIAGNOSIS — M47817 Spondylosis without myelopathy or radiculopathy, lumbosacral region: Secondary | ICD-10-CM | POA: Diagnosis not present

## 2014-04-21 DIAGNOSIS — M545 Low back pain, unspecified: Secondary | ICD-10-CM | POA: Diagnosis not present

## 2014-05-06 ENCOUNTER — Ambulatory Visit: Payer: Medicare Other | Admitting: Family Medicine

## 2014-05-12 ENCOUNTER — Telehealth: Payer: Self-pay | Admitting: Family Medicine

## 2014-05-12 ENCOUNTER — Ambulatory Visit (INDEPENDENT_AMBULATORY_CARE_PROVIDER_SITE_OTHER): Payer: Medicare Other | Admitting: Family Medicine

## 2014-05-12 ENCOUNTER — Encounter: Payer: Self-pay | Admitting: Family Medicine

## 2014-05-12 VITALS — BP 134/70 | HR 68 | Temp 97.9°F | Wt 169.0 lb

## 2014-05-12 DIAGNOSIS — R609 Edema, unspecified: Secondary | ICD-10-CM

## 2014-05-12 DIAGNOSIS — E785 Hyperlipidemia, unspecified: Secondary | ICD-10-CM

## 2014-05-12 DIAGNOSIS — R600 Localized edema: Secondary | ICD-10-CM

## 2014-05-12 DIAGNOSIS — I10 Essential (primary) hypertension: Secondary | ICD-10-CM | POA: Diagnosis not present

## 2014-05-12 MED ORDER — HYDROCHLOROTHIAZIDE 25 MG PO TABS: 25.0000 mg | ORAL_TABLET | Freq: Every day | ORAL | Status: DC

## 2014-05-12 NOTE — Patient Instructions (Signed)
Potassium Content of Foods Potassium is a mineral found in many foods and drinks. It helps keep fluids and minerals balanced in your body and also affects how steadily your heart beats. The body needs potassium to control blood pressure and to keep the muscles and nervous system healthy. However, certain health conditions and medicine may require you to eat more or less potassium-rich foods and drinks. Your caregiver or dietitian will tell you how much potassium you should have each day. COMMON SERVING SIZES The list below tells you how big or small common portion sizes are:  1 oz.........4 stacked dice.  3 oz.........Deck of cards.  1 tsp........Tip of little finger.  1 tbsp......Thumb.  2 tbsp......Golf ball.   c...........Half of a fist.  1 c............A fist. FOODS AND DRINKS HIGH IN POTASSIUM More than 200 mg of potassium per serving. A serving size is  c (120 mL or noted gram weight) unless otherwise stated. While all the items on this list are high in potassium, some items are higher in potassium than others. Fruits  Apricots (sliced), 83 g.  Apricots (dried halves), 3 oz / 24 g.  Avocado (cubed),  c / 50 g.  Banana (sliced), 75 g.  Cantaloupe (cubed), 80 g.  Dates (pitted), 5 whole / 35 g.  Figs (dried), 4 whole / 32 g.  Guava, c / 55 g.  Honeydew, 1 wedge / 85 g.  Kiwi (sliced), 90 g.  Nectarine, 1 small / 129 g.  Orange, 1 medium / 131 g.  Orange juice.  Pomegranate seeds, 87 g.  Pomegranate juice.  Prunes (pitted), 3 whole / 30 g.  Prune juice, 3 oz / 90 mL.  Seedless raisins, 3 tbsp / 27 g. Vegetables  Artichoke,  of a medium / 64 g.  Asparagus (boiled), 90 g.  Baked beans,  c / 63 g.  Bamboo shoots,  c / 38 g.  Beets (cooked slices), 85 g.  Broccoli (boiled), 78 g.  Brussels sprout (boiled), 78 g.  Butternut squash (baked), 103 g.  Chickpea (cooked), 82 g.  Green peas (cooked), 80 g.  Hubbard squash (baked cubes),  c /  68 g.  Kidney beans (cooked), 5 tbsp / 55 g.  Lima beans (cooked),  c / 43 g.  Navy beans (cooked),  c / 61 g.  Potato (baked), 61 g.  Potato (boiled), 78 g.  Pumpkin (boiled), 123 g.  Refried beans,  c / 79 g.  Spinach (cooked),  c / 45 g.  Split peas (cooked),  c / 65 g.  Sun-dried tomatoes, 2 tbsp / 7 g.  Sweet potato (baked),  c / 50 g.  Tomato (chopped or sliced), 90 g.  Tomato juice.  Tomato paste, 4 tsp / 21 g.  Tomato sauce,  c / 61 g.  Vegetable juice.  White mushrooms (cooked), 78 g.  Yam (cooked or baked),  c / 34 g.  Zucchini squash (boiled), 90 g. Other Foods and Drinks  Almonds (whole),  c / 36 g.  Cashews (oil roasted),  c / 32 g.  Chocolate milk.  Chocolate pudding, 142 g.  Clams (steamed), 1.5 oz / 43 g.  Dark chocolate, 1.5 oz / 42 g.  Fish, 3 oz / 85 g.  King crab (steamed), 3 oz / 85 g.  Lobster (steamed), 4 oz / 113 g.  Milk (skim, 1%, 2%, whole), 1 c / 240 mL.  Milk chocolate, 2.3 oz / 66 g.  Milk shake.  Nonfat fruit   variety yogurt, 123 g.  Peanuts (oil roasted), 1 oz / 28 g.  Peanut butter, 2 tbsp / 32 g.  Pistachio nuts, 1 oz / 28 g.  Pumpkin seeds, 1 oz / 28 g.  Red meat (broiled, cooked, grilled), 3 oz / 85 g.  Scallops (steamed), 3 oz / 85 g.  Shredded wheat cereal (dry), 3 oblong biscuits / 75 g.  Spaghetti sauce,  c / 66 g.  Sunflower seeds (dry roasted), 1 oz / 28 g.  Veggie burger, 1 patty / 70 g. FOODS MODERATE IN POTASSIUM Between 150 mg and 200 mg per serving. A serving is  c (120 mL or noted gram weight) unless otherwise stated. Fruits  Grapefruit,  of the fruit / 123 g.  Grapefruit juice.  Pineapple juice.  Plums (sliced), 83 g.  Tangerine, 1 large / 120 g. Vegetables  Carrots (boiled), 78 g.  Carrots (sliced), 61 g.  Rhubarb (cooked with sugar), 120 g.  Rutabaga (cooked), 120 g.  Sweet corn (cooked), 75 g.  Yellow snap beans (cooked), 63 g. Other Foods and  Drinks   Bagel, 1 bagel / 98 g.  Chicken breast (roasted and chopped),  c / 70 g.  Chocolate ice cream / 66 g.  Pita bread, 1 large / 64 g.  Shrimp (steamed), 4 oz / 113 g.  Swiss cheese (diced), 70 g.  Vanilla ice cream, 66 g.  Vanilla pudding, 140 g. FOODS LOW IN POTASSIUM Less than 150 mg per serving. A serving size is  cup (120 mL or noted gram weight) unless otherwise stated. If you eat more than 1 serving of a food low in potassium, the food may be considered a food high in potassium. Fruits  Apple (slices), 55 g.  Apple juice.  Applesauce, 122 g.  Blackberries, 72 g.  Blueberries, 74 g.  Cranberries, 50 g.  Cranberry juice.  Fruit cocktail, 119 g.  Fruit punch.  Grapes, 46 g.  Grape juice.  Mandarin oranges (canned), 126 g.  Peach (slices), 77 g.  Pineapple (chunks), 83 g.  Raspberries, 62 g.  Red cherries (without pits), 78 g.  Strawberries (sliced), 83 g.  Watermelon (diced), 76 g. Vegetables  Alfalfa sprouts, 17 g.  Bell peppers (sliced), 46 g.  Cabbage (shredded), 35 g.  Cauliflower (boiled), 62 g.  Celery, 51 g.  Collard greens (boiled), 95 g.  Cucumber (sliced), 52 g.  Eggplant (cubed), 41 g.  Green beans (boiled), 63 g.  Lettuce (shredded), 1 c / 36 g.  Onions (sauteed), 44 g.  Radishes (sliced), 58 g.  Spaghetti squash, 51 g. Other Foods and Drinks  Angel food cake, 1 slice / 28 g.  Black tea.  Brown rice (cooked), 98 g.  Butter croissant, 1 medium / 57 g.  Carbonated soda.  Coffee.  Cheddar cheese (diced), 66 g.  Corn flake cereal (dry), 14 g.  Cottage cheese, 118 g.  Cream of rice cereal (cooked), 122 g.  Cream of wheat cereal (cooked), 126 g.  Crisped rice cereal (dry), 14 g.  Egg (boiled, fried, poached, omelet, scrambled), 1 large / 46 61 g.  English muffin, 1 muffin / 57 g.  Frozen ice pop, 1 pop / 55 g.  Graham cracker, 1 large rectangular cracker / 14 g.  Jelly beans, 112  g.  Non-dairy whipped topping.  Oatmeal, 88 g.  Orange sherbet, 74 g.  Puffed rice cereal (dry), 7 g.  Pasta (cooked), 70 g.  Rice cakes, 4 cakes / 36   g.  Sugared doughnut, 4 oz / 116 g.  White bread, 1 slice / 30 g.  White rice (cooked), 79 93 g.  Wild rice (cooked), 82 g.  Yellow cake, 1 slice / 68 g. Document Released: 07/25/2005 Document Revised: 11/27/2012 Document Reviewed: 04/26/2012 [employer] Patient Information 2014 [employer].  

## 2014-05-12 NOTE — Progress Notes (Signed)
Pre visit review using our clinic review tool, if applicable. No additional management support is needed unless otherwise documented below in the visit note. 

## 2014-05-12 NOTE — Progress Notes (Signed)
   Subjective:    Patient ID: Joshua Burns, male    DOB: 1925/11/15, 80 y.o.   MRN: [id number]  HPI  Patient here for medical followup. History of hypertension, hyperlipidemia, BPH. Has had some recent low back difficulties and has had 4 epidural injections. He is slightly improved. This has limited his activity somewhat. He takes amlodipine, doxazosin, and HCTZ. He is currently taking 12.5 mg HCTZ once daily. Had some recent peripheral edema issues. No dyspnea. Previous intolerance with ACE inhibitor with angioedema. Compliant with medications. No orthopnea. No chest pains. No dietary changes.  Takes simvastatin for hyperlipidemia. Nonsmoker  Past Medical History  Diagnosis Date  . HYPERLIPIDEMIA 01/28/2010  . HYPERTENSION 01/28/2010  . HYPERTROPHY PROSTATE W/UR OBST & OTH LUTS 01/28/2010  . CAD (coronary artery disease)   . Arthritis   . Glaucoma    Past Surgical History  Procedure Laterality Date  . Angioplasty  2004  . Cervical disc surgery  2004  . Hernia repair  2006  . Prostate surgery  2009    bx  . US echocardiography  08-04-2008    Est EF 55-60%  . Cardiovascular stress test  08-04-2008    EF 0%  . Prostate biopsy  03/2008    reports that he quit smoking about 35 years ago. He has never used smokeless tobacco. He reports that he does not drink alcohol or use illicit drugs. family history includes Aneurysm in his brother; Heart disease in his father and mother. Allergies  Allergen Reactions  . Ace Inhibitors Swelling     Review of Systems  Constitutional: Negative for fever, chills and fatigue.  Eyes: Negative for visual disturbance.  Respiratory: Negative for cough, chest tightness and shortness of breath.   Cardiovascular: Positive for leg swelling. Negative for chest pain and palpitations.  Genitourinary: Negative for dysuria.  Neurological: Negative for dizziness, syncope, weakness, light-headedness and headaches.       Objective:   Physical Exam    Constitutional: He is oriented to person, place, and time. He appears well-developed and well-nourished.  Neck: Neck supple. No thyromegaly present.  Cardiovascular: Normal rate and regular rhythm.   Pulmonary/Chest: Effort normal and breath sounds normal. No respiratory distress. He has no wheezes. He has no rales.  Musculoskeletal: He exhibits edema.  Patient has trace to 1+ pitting edema lower legs bilaterally  Neurological: He is alert and oriented to person, place, and time.  Skin: No rash noted.          Assessment & Plan:  #1 hypertension. Well controlled. # 2 bilateral leg edema. Suspect related to venous stasis and also on low-dose amlodipine. We discussed titration of HCTZ to 25 mg daily. Recheck basic metabolic panel at followup in one month. We'll need to make sure he does not develop any hypokalemia or hyponatremia on increased dosage. #3 history of CAD/hyperlipidemia. Check fasting lipids at followup

## 2014-05-12 NOTE — Telephone Encounter (Signed)
Relevant patient education mailed to patient.  

## 2014-06-09 DIAGNOSIS — H04129 Dry eye syndrome of unspecified lacrimal gland: Secondary | ICD-10-CM | POA: Diagnosis not present

## 2014-06-09 DIAGNOSIS — H40019 Open angle with borderline findings, low risk, unspecified eye: Secondary | ICD-10-CM | POA: Diagnosis not present

## 2014-06-12 ENCOUNTER — Encounter: Payer: Self-pay | Admitting: Family Medicine

## 2014-06-12 ENCOUNTER — Ambulatory Visit (INDEPENDENT_AMBULATORY_CARE_PROVIDER_SITE_OTHER): Payer: Medicare Other | Admitting: Family Medicine

## 2014-06-12 VITALS — BP 122/56 | HR 68 | Temp 98.5°F | Ht 65.0 in | Wt 165.0 lb

## 2014-06-12 DIAGNOSIS — I10 Essential (primary) hypertension: Secondary | ICD-10-CM

## 2014-06-12 DIAGNOSIS — E785 Hyperlipidemia, unspecified: Secondary | ICD-10-CM | POA: Diagnosis not present

## 2014-06-12 DIAGNOSIS — R609 Edema, unspecified: Secondary | ICD-10-CM

## 2014-06-12 DIAGNOSIS — R600 Localized edema: Secondary | ICD-10-CM

## 2014-06-12 MED ORDER — DILTIAZEM HCL ER COATED BEADS 180 MG PO CP24: 180.0000 mg | ORAL_CAPSULE | Freq: Every day | ORAL | Status: DC

## 2014-06-12 NOTE — Progress Notes (Signed)
Pre visit review using our clinic review tool, if applicable. No additional management support is needed unless otherwise documented below in the visit note. 

## 2014-06-12 NOTE — Progress Notes (Signed)
   Subjective:    Patient ID: Joshua Burns, male    DOB: [birth year], 80 y.o.   MRN: [id number]  HPI Medical followup. His chronic problems include history of CAD, hypertension, hyperlipidemia, BPH, glaucoma. He has some chronic leg edema and we increased his HCTZ to 25 mg daily last visit. Not seen much change in edema. No dyspnea. No orthopnea. Edema is always worse late in the day. He refuses compression garments. He is aware that amlodipine is likely contributing. No dietary changes. Watches his sodium intake closely.  Blood pressures been well controlled. 503U systolic by home readings. He's had previous angioedema with ACE inhibitor  Past Medical History  Diagnosis Date  . HYPERLIPIDEMIA 01/28/2010  . HYPERTENSION 01/28/2010  . HYPERTROPHY PROSTATE W/UR OBST & OTH LUTS 01/28/2010  . CAD (coronary artery disease)   . Arthritis   . Glaucoma    Past Surgical History  Procedure Laterality Date  . Angioplasty  2004  . Cervical disc surgery  2004  . Hernia repair  2006  . Prostate surgery  2009    bx  . US echocardiography  08-04-2008    Est EF 55-60%  . Cardiovascular stress test  08-04-2008    EF 0%  . Prostate biopsy  03/2008    reports that he quit smoking about 35 years ago. He has never used smokeless tobacco. He reports that he does not drink alcohol or use illicit drugs. family history includes Aneurysm in his brother; Heart disease in his father and mother. Allergies  Allergen Reactions  . Ace Inhibitors Swelling      Review of Systems  Constitutional: Negative for appetite change, fatigue and unexpected weight change.  Eyes: Negative for visual disturbance.  Respiratory: Negative for cough, chest tightness and shortness of breath.   Cardiovascular: Positive for leg swelling. Negative for chest pain and palpitations.  Gastrointestinal: Negative for abdominal pain.  Endocrine: Negative for polydipsia and polyuria.  Neurological: Negative for dizziness, syncope, weakness,  light-headedness and headaches.       Objective:   Physical Exam  Constitutional: He is oriented to person, place, and time. He appears well-developed and well-nourished.  HENT:  Right Ear: External ear normal.  Left Ear: External ear normal.  Mouth/Throat: Oropharynx is clear and moist.  Eyes: Pupils are equal, round, and reactive to light.  Neck: Neck supple. No thyromegaly present.  Cardiovascular: Normal rate and regular rhythm.   Pulmonary/Chest: Effort normal and breath sounds normal. No respiratory distress. He has no wheezes. He has no rales.  Musculoskeletal: He exhibits edema.  Trace pitting edema legs bilaterally  Neurological: He is alert and oriented to person, place, and time.          Assessment & Plan:  #1 hypertension. Well controlled. He's having some bilateral leg edema which began suspect largely venous stasis but also possibly related to amlodipine. He cannot take ACE inhibitor or angiotensin receptor blocker because of prior angioedema with ACE inhibitor. Continue HCTZ. Discontinue amlodipine. Start diltiazem CD 180 mg once daily. He is aware that even this medication can cause some edema. Reassess him in 2 months #2 hyperlipidemia. Check lipid and hepatic panel. Continue simvastatin. #3 BPH. Symptomatically stable. Continue doxazosin

## 2014-06-29 ENCOUNTER — Encounter: Payer: Self-pay | Admitting: Family Medicine

## 2014-06-29 ENCOUNTER — Ambulatory Visit (INDEPENDENT_AMBULATORY_CARE_PROVIDER_SITE_OTHER): Payer: Medicare Other | Admitting: Family Medicine

## 2014-06-29 VITALS — BP 140/68 | HR 73 | Wt 165.0 lb

## 2014-06-29 DIAGNOSIS — I10 Essential (primary) hypertension: Secondary | ICD-10-CM | POA: Diagnosis not present

## 2014-06-29 NOTE — Patient Instructions (Signed)
Continue to monitor home blood pressure and be in touch if BP consistently > 150/90 Bring your BP cuff at follow up visit.

## 2014-06-29 NOTE — Progress Notes (Signed)
   Subjective:    Patient ID: Joshua Burns, male    DOB: [birth year], 80 y.o.   MRN: [id number]  HPI Followup regarding hypertension and peripheral edema issues. Patient had blood pressure well controlled on HCTZ and amlodipine. However, he was having bilateral leg edema. He's had previous angioedema with ACE and we were also trying to avoid ARB for that reason. We switched him to diltiazem but he had edema with that and also palpitations and he stopped this on his own several days ago. Monitoring blood pressure at home and blood pressure consistently 275T to 700F systolic. No headaches. Edema has improved after stopping diltiazem. No dizziness. No chest pain. No headache.  Past Medical History  Diagnosis Date  . HYPERLIPIDEMIA 01/28/2010  . HYPERTENSION 01/28/2010  . HYPERTROPHY PROSTATE W/UR OBST & OTH LUTS 01/28/2010  . CAD (coronary artery disease)   . Arthritis   . Glaucoma    Past Surgical History  Procedure Laterality Date  . Angioplasty  2004  . Cervical disc surgery  2004  . Hernia repair  2006  . Prostate surgery  2009    bx  . US echocardiography  08-04-2008    Est EF 55-60%  . Cardiovascular stress test  08-04-2008    EF 0%  . Prostate biopsy  03/2008    reports that he quit smoking about 35 years ago. He has never used smokeless tobacco. He reports that he does not drink alcohol or use illicit drugs. family history includes Aneurysm in his brother; Heart disease in his father and mother. Allergies  Allergen Reactions  . Ace Inhibitors Swelling  . Diltiazem Swelling and Palpitations      Review of Systems  Constitutional: Negative for fatigue.  Eyes: Negative for visual disturbance.  Respiratory: Negative for cough, chest tightness and shortness of breath.   Cardiovascular: Negative for chest pain, palpitations and leg swelling.  Neurological: Negative for dizziness, syncope, weakness, light-headedness and headaches.       Objective:   Physical Exam    Constitutional: He is oriented to person, place, and time. He appears well-developed and well-nourished.  HENT:  Right Ear: External ear normal.  Left Ear: External ear normal.  Mouth/Throat: Oropharynx is clear and moist.  Eyes: Pupils are equal, round, and reactive to light.  Neck: Neck supple. No thyromegaly present.  Cardiovascular: Normal rate and regular rhythm.   Pulmonary/Chest: Effort normal and breath sounds normal. No respiratory distress. He has no wheezes. He has no rales.  Musculoskeletal: He exhibits no edema.  Neurological: He is alert and oriented to person, place, and time.          Assessment & Plan:  Hypertension. Slightly elevated today but controlled by home readings. Edema has resolved after discontinuation of calcium channel blocker. We discussed options. Since he has had good home readings we've recommended he bring his cuff to compare with ours in the next couple weeks. If still elevated at that time we will need to consider additional medication

## 2014-06-29 NOTE — Progress Notes (Signed)
Pre visit review using our clinic review tool, if applicable. No additional management support is needed unless otherwise documented below in the visit note. 

## 2014-07-14 ENCOUNTER — Ambulatory Visit (INDEPENDENT_AMBULATORY_CARE_PROVIDER_SITE_OTHER): Payer: Medicare Other | Admitting: Family Medicine

## 2014-07-14 ENCOUNTER — Encounter: Payer: Self-pay | Admitting: Family Medicine

## 2014-07-14 VITALS — BP 136/68 | HR 68 | Temp 97.8°F | Wt 163.0 lb

## 2014-07-14 DIAGNOSIS — E785 Hyperlipidemia, unspecified: Secondary | ICD-10-CM | POA: Diagnosis not present

## 2014-07-14 DIAGNOSIS — I10 Essential (primary) hypertension: Secondary | ICD-10-CM

## 2014-07-14 LAB — HEPATIC FUNCTION PANEL
ALT: 12 U/L (ref 0–53)
AST: 16 U/L (ref 0–37)
Albumin: 4.1 g/dL (ref 3.5–5.2)
Alkaline Phosphatase: 54 U/L (ref 39–117)
BILIRUBIN TOTAL: 0.8 mg/dL (ref 0.2–1.2)
Bilirubin, Direct: 0.1 mg/dL (ref 0.0–0.3)
Total Protein: 7.1 g/dL (ref 6.0–8.3)

## 2014-07-14 LAB — LIPID PANEL
Cholesterol: 123 mg/dL (ref 0–200)
HDL: 34.5 mg/dL — AB (ref >39.00)
LDL Cholesterol: 77 mg/dL (ref 0–99)
NONHDL: 88.5
TRIGLYCERIDES: 59 mg/dL (ref 0.0–149.0)
Total CHOL/HDL Ratio: 4
VLDL: 11.8 mg/dL (ref 0.0–40.0)

## 2014-07-14 LAB — BASIC METABOLIC PANEL
BUN: 16 mg/dL (ref 6–23)
CALCIUM: 9.4 mg/dL (ref 8.4–10.5)
CO2: 32 mEq/L (ref 19–32)
Chloride: 105 mEq/L (ref 96–112)
Creatinine, Ser: 1.2 mg/dL (ref 0.4–1.5)
GFR: 70.85 mL/min (ref >60.00)
Glucose, Bld: 103 mg/dL — ABNORMAL HIGH (ref 70–99)
Potassium: 4.1 mEq/L (ref 3.5–5.1)
SODIUM: 141 meq/L (ref 135–145)

## 2014-07-14 NOTE — Progress Notes (Signed)
Pre visit review using our clinic review tool, if applicable. No additional management support is needed unless otherwise documented below in the visit note. 

## 2014-07-14 NOTE — Progress Notes (Signed)
   Subjective:    Patient ID: Joshua Burns, male    DOB: August 29, 1925, 80 y.o.   MRN: [id number]  HPI Followup hypertension  Recent elevated readings here around 759 systolic. We suspected possible white coat syndrome. Patient consistently getting 163W to 466 systolic at home. He takes Cardura and HCTZ. Previous angioedema with ACE inhibitor. Increased peripheral edema with calcium channel blockers. Patient has no headaches. No peripheral edema. No fatigue issues.  Hyperlipidemia treated with simvastatin. No myalgias. He does have some low back pain which is chronic. History of CAD. No exertional chest pains. He takes regular aspirin.  Reviewed with no changes:  Past Medical History  Diagnosis Date  . HYPERLIPIDEMIA 01/28/2010  . HYPERTENSION 01/28/2010  . HYPERTROPHY PROSTATE W/UR OBST & OTH LUTS 01/28/2010  . CAD (coronary artery disease)   . Arthritis   . Glaucoma    Past Surgical History  Procedure Laterality Date  . Angioplasty  2004  . Cervical disc surgery  2004  . Hernia repair  2006  . Prostate surgery  2009    bx  . US echocardiography  08-04-2008    Est EF 55-60%  . Cardiovascular stress test  08-04-2008    EF 0%  . Prostate biopsy  03/2008    reports that he quit smoking about 35 years ago. He has never used smokeless tobacco. He reports that he does not drink alcohol or use illicit drugs. family history includes Aneurysm in his brother; Heart disease in his father and mother. Allergies  Allergen Reactions  . Ace Inhibitors Swelling  . Diltiazem Swelling and Palpitations      Review of Systems  Constitutional: Negative for fatigue and unexpected weight change.  Eyes: Negative for visual disturbance.  Respiratory: Negative for cough, chest tightness and shortness of breath.   Cardiovascular: Negative for chest pain, palpitations and leg swelling.  Endocrine: Negative for polydipsia and polyuria.  Neurological: Negative for dizziness, syncope, weakness,  light-headedness and headaches.       Objective:   Physical Exam  Constitutional: He is oriented to person, place, and time. He appears well-developed and well-nourished.  HENT:  Right Ear: External ear normal.  Left Ear: External ear normal.  Mouth/Throat: Oropharynx is clear and moist.  Eyes: Pupils are equal, round, and reactive to light.  Neck: Neck supple. No thyromegaly present.  Cardiovascular: Normal rate and regular rhythm.   Pulmonary/Chest: Effort normal and breath sounds normal. No respiratory distress. He has no wheezes. He has no rales.  Musculoskeletal: He exhibits no edema.  Neurological: He is alert and oriented to person, place, and time.  Psychiatric: He has a normal mood and affect. His behavior is normal.          Assessment & Plan:  #1 hypertension. Probable white coat syndrome. We checked his machine by ours today and got almost identical reading with 160/65 sitting. He has consistently gotten good readings at home and we feel confident that his machine is fairly accurate. Continue to observe on current medications. Check basic metabolic panel #2 hyperlipidemia. Repeat lipid and hepatic panel. Continue simvastatin

## 2014-07-14 NOTE — Patient Instructions (Signed)
Remember flu shot this Fall.

## 2014-08-04 ENCOUNTER — Telehealth: Payer: Self-pay | Admitting: Family Medicine

## 2014-08-04 NOTE — Telephone Encounter (Signed)
Yes.  We can try HCTZ 12.5 mg once daily.

## 2014-08-04 NOTE — Telephone Encounter (Signed)
Pt received a letter from [facility] informing him that his rx hydrochlorothiazide (HYDRODIURIL) 25 MG tablet has been recall by FDA, pt would like to know what other medicine can Dr. Elease Hashimoto prescribe for him  Pt would like to know if he can put him back on 12.5mg .  A copy of the letter has been placed in Dr. Elease Hashimoto box. Send new rx to [facility]

## 2014-08-05 ENCOUNTER — Other Ambulatory Visit: Payer: Self-pay

## 2014-08-05 MED ORDER — HYDROCHLOROTHIAZIDE 12.5 MG PO TABS: 12.5000 mg | ORAL_TABLET | Freq: Every day | ORAL | Status: DC

## 2014-08-05 NOTE — Telephone Encounter (Signed)
Rx changed and rx sent to pharmacy

## 2014-08-12 ENCOUNTER — Ambulatory Visit: Payer: Medicare Other | Admitting: Family Medicine

## 2014-09-30 DIAGNOSIS — Z23 Encounter for immunization: Secondary | ICD-10-CM | POA: Diagnosis not present

## 2015-01-14 ENCOUNTER — Ambulatory Visit (INDEPENDENT_AMBULATORY_CARE_PROVIDER_SITE_OTHER): Payer: Medicare Other | Admitting: Family Medicine

## 2015-01-14 ENCOUNTER — Encounter: Payer: Self-pay | Admitting: Family Medicine

## 2015-01-14 VITALS — BP 132/70 | HR 66 | Temp 97.5°F | Wt 157.0 lb

## 2015-01-14 DIAGNOSIS — R1031 Right lower quadrant pain: Secondary | ICD-10-CM

## 2015-01-14 DIAGNOSIS — E785 Hyperlipidemia, unspecified: Secondary | ICD-10-CM

## 2015-01-14 DIAGNOSIS — R1030 Lower abdominal pain, unspecified: Secondary | ICD-10-CM

## 2015-01-14 DIAGNOSIS — I10 Essential (primary) hypertension: Secondary | ICD-10-CM

## 2015-01-14 NOTE — Progress Notes (Signed)
   Subjective:    Patient ID: Joshua Burns, male    DOB: [birth year], 81 y.o.   MRN: [id number]  HPI Patient seen for routine medical follow-up. His wife was recently diagnosed with uterine cancer and has been undergoing chemotherapy and radiation therapy he's been very busy with that. His chronic problems include hypertension and hyperlipidemia.  Also has history of BPH and glaucoma as well as history of CAD. No recent chest pains. He's had previous ACE inhibitor related angioedema. He is currently treated with doxazosin and HCTZ. Also remains on simvastatin. Blood pressure consistently 409 systolic and 81X to 91Y diastolic.  Remote history of right inguinal hernia repair 2004. Recently had some nighttime pain dry in the region but no recurrent bulge. No dysuria. No change in bowel habits.  Reviewed with no changes:  Past Medical History  Diagnosis Date  . HYPERLIPIDEMIA 01/28/2010  . HYPERTENSION 01/28/2010  . HYPERTROPHY PROSTATE W/UR OBST & OTH LUTS 01/28/2010  . CAD (coronary artery disease)   . Arthritis   . Glaucoma    Past Surgical History  Procedure Laterality Date  . Angioplasty  2004  . Cervical disc surgery  2004  . Hernia repair  2006  . Prostate surgery  2009    bx  . US echocardiography  08-04-2008    Est EF 55-60%  . Cardiovascular stress test  08-04-2008    EF 0%  . Prostate biopsy  03/2008    reports that he quit smoking about 36 years ago. He has never used smokeless tobacco. He reports that he does not drink alcohol or use illicit drugs. family history includes Aneurysm in his brother; Heart disease in his father and mother. Allergies  Allergen Reactions  . Ace Inhibitors Swelling  . Diltiazem Swelling and Palpitations      Review of Systems  Constitutional: Negative for fatigue.  Eyes: Negative for visual disturbance.  Respiratory: Negative for cough, chest tightness and shortness of breath.   Cardiovascular: Negative for chest pain, palpitations and leg  swelling.  Gastrointestinal: Negative for diarrhea, constipation and blood in stool.  Genitourinary: Negative for dysuria.  Neurological: Negative for dizziness, syncope, weakness, light-headedness and headaches.       Objective:   Physical Exam  Constitutional: He appears well-developed and well-nourished.  Cardiovascular: Normal rate and regular rhythm.   Pulmonary/Chest: Effort normal and breath sounds normal. No respiratory distress. He has no wheezes. He has no rales.  Genitourinary:  Inguinal exam reveals scar from previous right femoral hernia repair. No mass. No evidence for recurrent hernia  Musculoskeletal: He exhibits no edema.  Neurological: He is alert.          Assessment & Plan:  #1 hypertension. Well controlled by home readings. Continue HCTZ. Recheck basic metabolic panel at follow-up #2 hyperlipidemia in a patient with CAD history. Continue simvastatin. We'll plan to recheck lipids in 6 months #3 right abdominal pain. No evidence for recurrent hernia. Question from scar tissue from prior surgery. If pain persist consider consult with general surgeon but currently symptoms are minimal

## 2015-01-14 NOTE — Progress Notes (Signed)
Pre visit review using our clinic review tool, if applicable. No additional management support is needed unless otherwise documented below in the visit note. 

## 2015-01-21 ENCOUNTER — Other Ambulatory Visit: Payer: Self-pay | Admitting: Family Medicine

## 2015-01-25 ENCOUNTER — Other Ambulatory Visit: Payer: Self-pay | Admitting: Family Medicine

## 2015-01-26 ENCOUNTER — Other Ambulatory Visit: Payer: Self-pay | Admitting: Family Medicine

## 2015-06-24 DIAGNOSIS — Z961 Presence of intraocular lens: Secondary | ICD-10-CM | POA: Diagnosis not present

## 2015-06-24 DIAGNOSIS — H35033 Hypertensive retinopathy, bilateral: Secondary | ICD-10-CM | POA: Diagnosis not present

## 2015-06-24 DIAGNOSIS — H04123 Dry eye syndrome of bilateral lacrimal glands: Secondary | ICD-10-CM | POA: Diagnosis not present

## 2015-06-24 DIAGNOSIS — H40013 Open angle with borderline findings, low risk, bilateral: Secondary | ICD-10-CM | POA: Diagnosis not present

## 2015-07-15 ENCOUNTER — Ambulatory Visit (INDEPENDENT_AMBULATORY_CARE_PROVIDER_SITE_OTHER): Payer: Medicare Other | Admitting: Family Medicine

## 2015-07-15 ENCOUNTER — Encounter: Payer: Self-pay | Admitting: Family Medicine

## 2015-07-15 VITALS — BP 128/66 | HR 63 | Temp 97.8°F | Wt 149.0 lb

## 2015-07-15 DIAGNOSIS — I10 Essential (primary) hypertension: Secondary | ICD-10-CM

## 2015-07-15 DIAGNOSIS — E785 Hyperlipidemia, unspecified: Secondary | ICD-10-CM | POA: Diagnosis not present

## 2015-07-15 DIAGNOSIS — N400 Enlarged prostate without lower urinary tract symptoms: Secondary | ICD-10-CM | POA: Diagnosis not present

## 2015-07-15 LAB — LIPID PANEL
CHOL/HDL RATIO: 3
CHOLESTEROL: 114 mg/dL (ref 0–200)
HDL: 38.1 mg/dL — ABNORMAL LOW (ref >39.00)
LDL Cholesterol: 63 mg/dL (ref 0–99)
NonHDL: 75.9
Triglycerides: 67 mg/dL (ref 0.0–149.0)
VLDL: 13.4 mg/dL (ref 0.0–40.0)

## 2015-07-15 LAB — BASIC METABOLIC PANEL
BUN: 15 mg/dL (ref 6–23)
CALCIUM: 9.5 mg/dL (ref 8.4–10.5)
CHLORIDE: 102 meq/L (ref 96–112)
CO2: 31 mEq/L (ref 19–32)
Creatinine, Ser: 1.2 mg/dL (ref 0.40–1.50)
GFR: 73.42 mL/min (ref >60.00)
Glucose, Bld: 96 mg/dL (ref 70–99)
POTASSIUM: 3.8 meq/L (ref 3.5–5.1)
Sodium: 141 mEq/L (ref 135–145)

## 2015-07-15 LAB — HEPATIC FUNCTION PANEL
ALBUMIN: 4.3 g/dL (ref 3.5–5.2)
ALK PHOS: 53 U/L (ref 39–117)
ALT: 7 U/L (ref 0–53)
AST: 13 U/L (ref 0–37)
BILIRUBIN DIRECT: 0.1 mg/dL (ref 0.0–0.3)
BILIRUBIN TOTAL: 0.6 mg/dL (ref 0.2–1.2)
Total Protein: 6.9 g/dL (ref 6.0–8.3)

## 2015-07-15 NOTE — Progress Notes (Signed)
   Subjective:    Patient ID: Joshua Burns, male    DOB: [birth year], 81 y.o.   MRN: [id number]  HPI Patient here for routine medical follow-up. He has history of CAD, hyperlipidemia, hypertension, BPH. His medications include simvastatin, HCTZ, doxazosin. BPH symptoms are stable. No recent chest pains. He occasionally feels off balance but no recent falls. No orthostatic symptoms. He has lost some weight over the past several months which he attributes to his wife's cancer and the fact that he has not been eating as much as she had curtailed her intake. He also had several dental procedures which impaired his intake. He states his appetite is fair and starting to improve now. He denies any abdominal pain. No change in stool habits.  Past Medical History  Diagnosis Date  . HYPERLIPIDEMIA 01/28/2010  . HYPERTENSION 01/28/2010  . HYPERTROPHY PROSTATE W/UR OBST & OTH LUTS 01/28/2010  . CAD (coronary artery disease)   . Arthritis   . Glaucoma    Past Surgical History  Procedure Laterality Date  . Angioplasty  2004  . Cervical disc surgery  2004  . Hernia repair  2006  . Prostate surgery  2009    bx  . US echocardiography  08-04-2008    Est EF 55-60%  . Cardiovascular stress test  08-04-2008    EF 0%  . Prostate biopsy  03/2008    reports that he quit smoking about 36 years ago. He has never used smokeless tobacco. He reports that he does not drink alcohol or use illicit drugs. family history includes Aneurysm in his brother; Heart disease in his father and mother. Allergies  Allergen Reactions  . Ace Inhibitors Swelling  . Diltiazem Swelling and Palpitations      Review of Systems  Constitutional: Negative for fatigue.  HENT: Negative for trouble swallowing.   Eyes: Negative for visual disturbance.  Respiratory: Negative for cough, chest tightness and shortness of breath.   Cardiovascular: Negative for chest pain, palpitations and leg swelling.  Gastrointestinal: Negative for nausea,  vomiting, abdominal pain and diarrhea.  Genitourinary: Negative for dysuria.  Neurological: Negative for dizziness, syncope, weakness, light-headedness and headaches.       Objective:   Physical Exam  Constitutional: He is oriented to person, place, and time. He appears well-developed and well-nourished. No distress.  Neck: Neck supple. No JVD present.  Cardiovascular: Normal rate and regular rhythm.   Pulmonary/Chest: Effort normal and breath sounds normal. No respiratory distress. He has no wheezes. He has no rales.  Musculoskeletal: He exhibits no edema.  Neurological: He is alert and oriented to person, place, and time.          Assessment & Plan:  #1 hypertension stable and at goal. Recheck basic metabolic panel #2 BPH. Symptomatically stable on Cardura. He is cautioned about potential dizziness with this at night when he gets up though he has not had any orthostatic type symptoms  #3 dyslipidemia. He also has history of CAD. Recheck lipid and hepatic panel #4 weight loss. He attributes this to dental work and his wife's illness. We discussed further evaluation and at this point he is not interested. He thinks his appetite has increased over the past month

## 2015-07-15 NOTE — Progress Notes (Signed)
Pre visit review using our clinic review tool, if applicable. No additional management support is needed unless otherwise documented below in the visit note. 

## 2015-09-02 ENCOUNTER — Encounter (HOSPITAL_COMMUNITY): Payer: Self-pay | Admitting: Emergency Medicine

## 2015-09-02 ENCOUNTER — Emergency Department (HOSPITAL_COMMUNITY)
Admission: EM | Admit: 2015-09-02 | Discharge: 2015-09-02 | Disposition: A | Discharge status: Home / Self Care | Payer: Medicare Other | Attending: Emergency Medicine | Admitting: Emergency Medicine

## 2015-09-02 ENCOUNTER — Emergency Department (HOSPITAL_COMMUNITY): Payer: Medicare Other

## 2015-09-02 DIAGNOSIS — R0602 Shortness of breath: Secondary | ICD-10-CM | POA: Diagnosis not present

## 2015-09-02 DIAGNOSIS — Z79899 Other long term (current) drug therapy: Secondary | ICD-10-CM | POA: Insufficient documentation

## 2015-09-02 DIAGNOSIS — E785 Hyperlipidemia, unspecified: Secondary | ICD-10-CM | POA: Insufficient documentation

## 2015-09-02 DIAGNOSIS — M1990 Unspecified osteoarthritis, unspecified site: Secondary | ICD-10-CM | POA: Diagnosis not present

## 2015-09-02 DIAGNOSIS — H409 Unspecified glaucoma: Secondary | ICD-10-CM | POA: Diagnosis not present

## 2015-09-02 DIAGNOSIS — R05 Cough: Secondary | ICD-10-CM | POA: Insufficient documentation

## 2015-09-02 DIAGNOSIS — R059 Cough, unspecified: Secondary | ICD-10-CM

## 2015-09-02 DIAGNOSIS — I10 Essential (primary) hypertension: Secondary | ICD-10-CM | POA: Insufficient documentation

## 2015-09-02 DIAGNOSIS — I2510 Atherosclerotic heart disease of native coronary artery without angina pectoris: Secondary | ICD-10-CM | POA: Insufficient documentation

## 2015-09-02 DIAGNOSIS — Z9861 Coronary angioplasty status: Secondary | ICD-10-CM | POA: Insufficient documentation

## 2015-09-02 DIAGNOSIS — Z7982 Long term (current) use of aspirin: Secondary | ICD-10-CM | POA: Diagnosis not present

## 2015-09-02 MED ORDER — BENZONATATE 100 MG PO CAPS: 100.0000 mg | ORAL_CAPSULE | Freq: Three times a day (TID) | ORAL | Status: DC | PRN

## 2015-09-02 NOTE — ED Notes (Signed)
Patient has had a cold and coughing x 3 days. Patient was feeling really bad this am. Patient also states he had the jitters this am.

## 2015-09-02 NOTE — ED Notes (Signed)
Pt. returned from XR. 

## 2015-09-02 NOTE — ED Notes (Signed)
Pt c/o cold sx of cough and congestion x 3 days. Pt states he felt worse this morning.

## 2015-09-02 NOTE — Discharge Instructions (Signed)
Cough, Adult   A cough is a reflex. It helps you clear your throat and airways. A cough can help heal your body. A cough can last 2 or 3 weeks (acute) or may last more than 8 weeks (chronic). Some common causes of a cough can include an infection, allergy, or a cold.  HOME CARE  · Only take medicine as told by your doctor.  · If given, take your medicines (antibiotics) as told. Finish them even if you start to feel better.  · Use a cold steam vaporizer or humidifier in your home. This can help loosen thick spit (secretions).  · Sleep so you are almost sitting up (semi-upright). Use pillows to do this. This helps reduce coughing.  · Rest as needed.  · Stop smoking if you smoke.  GET HELP RIGHT AWAY IF:  · You have yellowish-white fluid (pus) in your thick spit.  · Your cough gets worse.  · Your medicine does not reduce coughing, and you are losing sleep.  · You cough up blood.  · You have trouble breathing.  · Your pain gets worse and medicine does not help.  · You have a fever.  MAKE SURE YOU:   · Understand these instructions.  · Will watch your condition.  · Will get help right away if you are not doing well or get worse.  Document Released: 08/24/2011 Document Revised: 04/27/2014 Document Reviewed: 08/24/2011  [employer]® Patient Information ©2015 [employer]. This information is not intended to replace advice given to you by your health care provider. Make sure you discuss any questions you have with your health care provider.

## 2015-09-02 NOTE — ED Provider Notes (Signed)
CSN: [id number]     Arrival date & time 09/02/15  1001 History   First MD Initiated Contact with Patient 09/02/15 1005     Chief Complaint  Patient presents with  . Cough  . cold sx      (Consider location/radiation/quality/duration/timing/severity/associated sxs/prior Treatment) HPI Comments: 81 year old male with history of hypertension, hyperlipidemia, CAD who presents with cough. The patient states that 3 days ago, he began having a cough and runny nose. He denies any associated sore throat or fevers. His cough has persisted and is worse at night. He began feeling worse this morning and felt "jittery" which is why he presents today. He denies any chest pain or shortness of breath. No vomiting, diarrhea, abdominal pain, or rashes. He has been eating and drinking well. Normal urination.  Patient is a 81 y.o. male presenting with cough. The history is provided by the patient.  Cough   Past Medical History  Diagnosis Date  . HYPERLIPIDEMIA 01/28/2010  . HYPERTENSION 01/28/2010  . HYPERTROPHY PROSTATE W/UR OBST & OTH LUTS 01/28/2010  . CAD (coronary artery disease)   . Arthritis   . Glaucoma    Past Surgical History  Procedure Laterality Date  . Angioplasty  2004  . Cervical disc surgery  2004  . Hernia repair  2006  . Prostate surgery  2009    bx  . US echocardiography  08-04-2008    Est EF 55-60%  . Cardiovascular stress test  08-04-2008    EF 0%  . Prostate biopsy  03/2008   Family History  Problem Relation Age of Onset  . Heart disease Mother   . Heart disease Father   . Aneurysm Brother    Social History  Substance Use Topics  . Smoking status: Former Smoker    Quit date: 12/25/1978  . Smokeless tobacco: Never Used  . Alcohol Use: No    Review of Systems  Respiratory: Positive for cough.     10 Systems reviewed and are negative for acute change except as noted in the HPI.   Allergies  Ace inhibitors and Diltiazem  Home Medications   Prior to Admission  medications   Medication Sig Start Date End Date Taking? Authorizing Provider  acetaminophen (TYLENOL) 500 MG tablet Take 500 mg by mouth every 6 (six) hours as needed.   Yes Historical Provider, MD  aspirin EC 325 MG tablet Take 325 mg by mouth every morning.   Yes Historical Provider, MD  doxazosin (CARDURA) 4 MG tablet TAKE 1 TABLET BY MOUTH AT BEDTIME 01/21/15  Yes Eulas Post, MD  guaiFENesin-dextromethorphan (ROBITUSSIN DM) 100-10 MG/5ML syrup Take 5 mLs by mouth every 4 (four) hours as needed for cough.   Yes Historical Provider, MD  hydrochlorothiazide (HYDRODIURIL) 12.5 MG tablet TAKE 1 TABLET EVERY DAY 01/26/15  Yes Eulas Post, MD  Multiple Vitamins-Minerals (MENS 50+ MULTI VITAMIN/MIN PO) Take 1 tablet by mouth daily.    Yes Historical Provider, MD  simvastatin (ZOCOR) 20 MG tablet TAKE 1 TABLET BY MOUTH EVERY DAY AT BEDTIME 01/26/15  Yes Eulas Post, MD  fluticasone (FLONASE) 50 MCG/ACT nasal spray Place 2 sprays into the nose daily. Patient not taking: Reported on 09/02/2015 06/13/12 07/15/15  Eulas Post, MD   BP 130/56 mmHg  Pulse 72  Temp(Src) 98.1 F (36.7 C) (Oral)  Resp 18  Ht 5\' 6"  (1.676 m)  Wt 145 lb (65.772 kg)  BMI 23.41 kg/m2  SpO2 95% Physical Exam  Constitutional: He is oriented  to person, place, and time. He appears well-developed and well-nourished. No distress.  HENT:  Head: Normocephalic and atraumatic.  Mouth/Throat: Oropharynx is clear and moist.  Moist mucous membranes  Eyes: Conjunctivae are normal. Pupils are equal, round, and reactive to light.  Neck: Neck supple.  Cardiovascular: Normal rate, regular rhythm and normal heart sounds.   No murmur heard. Pulmonary/Chest: Effort normal and breath sounds normal. He has no wheezes.  Abdominal: Soft. Bowel sounds are normal. He exhibits no distension. There is no tenderness.  Musculoskeletal: He exhibits no edema.  Neurological: He is alert and oriented to person, place, and time.   Fluent speech  Skin: Skin is warm and dry.  Psychiatric: He has a normal mood and affect. Judgment normal.  Nursing note and vitals reviewed.   ED Course  Procedures (including critical care time) Labs Review Labs Reviewed - No data to display  Imaging Review Dg Chest 2 View  09/02/2015   CLINICAL DATA:  Cough, shortness of breath.  EXAM: CHEST  2 VIEW  COMPARISON:  December 18, 2005.  FINDINGS: The heart size and mediastinal contours are within normal limits. Both lungs are clear. No pneumothorax or pleural effusion is noted. Anterior osteophyte formation is noted in mid thoracic spine.  IMPRESSION: No active cardiopulmonary disease.   Electronically Signed   By: Marijo Conception, M.D.   On: 09/02/2015 11:46      EKG Interpretation None      MDM   Final diagnoses:  None   cough   81 year old male who presents with 3 days of cough and runny nose. No associated fevers or shortness of breath. At presentation, the patient was well-appearing with normal vital signs. No abnormal lung sounds. Normal work of breathing. Given patient's advanced age, obtained a chest x-ray to rule out pneumonia. CXR unremarkable. Patient is otherwise well-appearing with normal vital signs.  Provided w/ Rx for Tessalon Perles and instructions to follow-up with his PCP. Return precautions including shortness of breath, fever, or worsening symptoms were reviewed. The patient and his wife voiced understanding. Pt discharged in satisfactory condition.    Sharlett Iles, MD 09/02/15 [id number]

## 2015-09-09 ENCOUNTER — Ambulatory Visit (INDEPENDENT_AMBULATORY_CARE_PROVIDER_SITE_OTHER): Payer: Medicare Other | Admitting: Family Medicine

## 2015-09-09 ENCOUNTER — Encounter: Payer: Self-pay | Admitting: Family Medicine

## 2015-09-09 VITALS — BP 130/60 | HR 64 | Temp 98.1°F | Ht 66.0 in | Wt 146.7 lb

## 2015-09-09 DIAGNOSIS — J069 Acute upper respiratory infection, unspecified: Secondary | ICD-10-CM

## 2015-09-09 DIAGNOSIS — Z23 Encounter for immunization: Secondary | ICD-10-CM | POA: Diagnosis not present

## 2015-09-09 DIAGNOSIS — B9789 Other viral agents as the cause of diseases classified elsewhere: Principal | ICD-10-CM

## 2015-09-09 NOTE — Patient Instructions (Signed)
Follow up promptly for any fever or increased shortness of breath. 

## 2015-09-09 NOTE — Progress Notes (Signed)
Pre visit review using our clinic review tool, if applicable. No additional management support is needed unless otherwise documented below in the visit note. 

## 2015-09-09 NOTE — Progress Notes (Signed)
   Subjective:    Patient ID: Joshua Burns, male    DOB: [birth year], 81 y.o.   MRN: [id number]  HPI Patient seen for hospital follow-up. He went to emergency room a few days ago with upper respiratory symptoms. Chest x-ray unremarkable. He had cough and nasal congestion and malaise. Never had any fever. No dyspnea. Prescribed Tessalon Perles which have helped with his cough. Overall, he feels he is improving. Drinking fluids well. No nausea, vomiting, or diarrhea. Denies sore throat.  Past Medical History  Diagnosis Date  . HYPERLIPIDEMIA 01/28/2010  . HYPERTENSION 01/28/2010  . HYPERTROPHY PROSTATE W/UR OBST & OTH LUTS 01/28/2010  . CAD (coronary artery disease)   . Arthritis   . Glaucoma    Past Surgical History  Procedure Laterality Date  . Angioplasty  2004  . Cervical disc surgery  2004  . Hernia repair  2006  . Prostate surgery  2009    bx  . US echocardiography  08-04-2008    Est EF 55-60%  . Cardiovascular stress test  08-04-2008    EF 0%  . Prostate biopsy  03/2008    reports that he quit smoking about 36 years ago. He has never used smokeless tobacco. He reports that he does not drink alcohol or use illicit drugs. family history includes Aneurysm in his brother; Heart disease in his father and mother. Allergies  Allergen Reactions  . Ace Inhibitors Swelling  . Diltiazem Swelling and Palpitations      Review of Systems  Constitutional: Positive for fatigue. Negative for fever and chills.  HENT: Positive for congestion. Negative for sore throat.   Respiratory: Positive for cough.   Gastrointestinal: Negative for nausea, vomiting and diarrhea.  Hematological: Negative for adenopathy.       Objective:   Physical Exam  Constitutional: He appears well-developed and well-nourished.  HENT:  Right Ear: External ear normal.  Left Ear: External ear normal.  Mouth/Throat: Oropharynx is clear and moist.  Neck: Neck supple.  Cardiovascular: Normal rate and regular rhythm.    Pulmonary/Chest: Effort normal and breath sounds normal. No respiratory distress. He has no wheezes. He has no rales.  Lymphadenopathy:    He has no cervical adenopathy.          Assessment & Plan:  Viral URI with cough. Nonfocal exam. Improving. Stay well-hydrated. Follow-up promptly for any fever or dyspnea or new symptoms

## 2015-09-09 NOTE — Addendum Note (Signed)
Addended by: Ailene Rud E on: 09/09/2015 12:01 PM   Modules accepted: Orders

## 2015-10-21 DIAGNOSIS — H40013 Open angle with borderline findings, low risk, bilateral: Secondary | ICD-10-CM | POA: Diagnosis not present

## 2015-10-27 ENCOUNTER — Other Ambulatory Visit: Payer: Self-pay | Admitting: Family Medicine

## 2016-01-07 ENCOUNTER — Ambulatory Visit (INDEPENDENT_AMBULATORY_CARE_PROVIDER_SITE_OTHER): Payer: Medicare Other | Admitting: Family Medicine

## 2016-01-07 ENCOUNTER — Encounter: Payer: Self-pay | Admitting: Family Medicine

## 2016-01-07 VITALS — BP 140/64 | HR 87 | Temp 98.4°F | Ht 66.0 in | Wt 149.0 lb

## 2016-01-07 DIAGNOSIS — J209 Acute bronchitis, unspecified: Secondary | ICD-10-CM

## 2016-01-07 MED ORDER — CEFUROXIME AXETIL 250 MG PO TABS: 250.0000 mg | ORAL_TABLET | Freq: Two times a day (BID) | ORAL | Status: DC

## 2016-01-07 NOTE — Progress Notes (Signed)
Pre visit review using our clinic review tool, if applicable. No additional management support is needed unless otherwise documented below in the visit note. 

## 2016-01-07 NOTE — Patient Instructions (Signed)

## 2016-01-07 NOTE — Progress Notes (Signed)
   Subjective:    Patient ID: Joshua Burns, male    DOB: [birth year], 82 y.o.   MRN: [id number]  HPI Acute visit for cough and nasal congestion. Onset about 4 days ago. Cough productive of yellow sputum. He's had some associated fatigue. No fevers or chills. Denies any nausea, vomiting, or diarrhea. No sick contacts. No wheezing. No dyspnea. Nonsmoker. Feels slightly better today than yesterday  Past Medical History  Diagnosis Date  . HYPERLIPIDEMIA 01/28/2010  . HYPERTENSION 01/28/2010  . HYPERTROPHY PROSTATE W/UR OBST & OTH LUTS 01/28/2010  . CAD (coronary artery disease)   . Arthritis   . Glaucoma    Past Surgical History  Procedure Laterality Date  . Angioplasty  2004  . Cervical disc surgery  2004  . Hernia repair  2006  . Prostate surgery  2009    bx  . US echocardiography  08-04-2008    Est EF 55-60%  . Cardiovascular stress test  08-04-2008    EF 0%  . Prostate biopsy  03/2008    reports that he quit smoking about 37 years ago. He has never used smokeless tobacco. He reports that he does not drink alcohol or use illicit drugs. family history includes Aneurysm in his brother; Heart disease in his father and mother. Allergies  Allergen Reactions  . Ace Inhibitors Swelling  . Diltiazem Swelling and Palpitations      Review of Systems  Constitutional: Positive for fatigue. Negative for fever and chills.  HENT: Positive for congestion. Negative for sore throat.   Respiratory: Positive for cough. Negative for shortness of breath.   Psychiatric/Behavioral: Negative for confusion.       Objective:   Physical Exam  Constitutional: He appears well-developed and well-nourished.  HENT:  Right Ear: External ear normal.  Left Ear: External ear normal.  Mouth/Throat: Oropharynx is clear and moist.  Neck: Neck supple.  Cardiovascular: Normal rate and regular rhythm.   Pulmonary/Chest: Effort normal and breath sounds normal. No respiratory distress. He has no wheezes. He  has no rales.  Musculoskeletal: He exhibits no edema.  Lymphadenopathy:    He has no cervical adenopathy.          Assessment & Plan:  Acute bronchitis. We explained these are usually viral. We've recommended observation for now. Wrote prescription for written Ceftin to start only if he develops any fever over the weekend or worsening symptoms otherwise observe.

## 2016-01-17 ENCOUNTER — Encounter: Payer: Self-pay | Admitting: Family Medicine

## 2016-01-17 ENCOUNTER — Ambulatory Visit (INDEPENDENT_AMBULATORY_CARE_PROVIDER_SITE_OTHER): Payer: Medicare Other | Admitting: Family Medicine

## 2016-01-17 VITALS — BP 140/66 | HR 79 | Temp 97.8°F | Ht 66.0 in | Wt 148.9 lb

## 2016-01-17 DIAGNOSIS — I10 Essential (primary) hypertension: Secondary | ICD-10-CM

## 2016-01-17 NOTE — Progress Notes (Signed)
   Subjective:    Patient ID: Joshua Burns, male    DOB: Oct 29, 1925, 82 y.o.   MRN: [id number]  HPI Patient seen for follow-up hypertension. He also has history of CAD and BPH. He is maintained on HCTZ and Cardura. Blood pressure stable. No orthostasis. Denies any chest pain. Compliant with therapy. We checked his lipids last summer they were stable. He is getting over recent cold and symptoms are improving. Cardura  Past Medical History  Diagnosis Date  . HYPERLIPIDEMIA 01/28/2010  . HYPERTENSION 01/28/2010  . HYPERTROPHY PROSTATE W/UR OBST & OTH LUTS 01/28/2010  . CAD (coronary artery disease)   . Arthritis   . Glaucoma    Past Surgical History  Procedure Laterality Date  . Angioplasty  2004  . Cervical disc surgery  2004  . Hernia repair  2006  . Prostate surgery  2009    bx  . US echocardiography  08-04-2008    Est EF 55-60%  . Cardiovascular stress test  08-04-2008    EF 0%  . Prostate biopsy  03/2008    reports that he quit smoking about 37 years ago. He has never used smokeless tobacco. He reports that he does not drink alcohol or use illicit drugs. family history includes Aneurysm in his brother; Heart disease in his father and mother. Allergies  Allergen Reactions  . Ace Inhibitors Swelling  . Diltiazem Swelling and Palpitations      Review of Systems  Constitutional: Negative for fatigue and unexpected weight change.  Eyes: Negative for visual disturbance.  Respiratory: Negative for cough, chest tightness and shortness of breath.   Cardiovascular: Negative for chest pain, palpitations and leg swelling.  Neurological: Negative for dizziness, syncope, weakness, light-headedness and headaches.       Objective:   Physical Exam  Constitutional: He appears well-developed and well-nourished.  Neck: Neck supple.  Cardiovascular: Normal rate and regular rhythm.  Exam reveals no gallop.   Pulmonary/Chest: Effort normal and breath sounds normal. No respiratory distress. He  has no wheezes. He has no rales.  Musculoskeletal: He exhibits no edema.  Lymphadenopathy:    He has no cervical adenopathy.          Assessment & Plan:  Hypertension. Stable and at goal. Continue current medications. We'll plan to check labs with lipid and basic metabolic panel at follow-up in 6 months.

## 2016-01-17 NOTE — Progress Notes (Signed)
Pre visit review using our clinic review tool, if applicable. No additional management support is needed unless otherwise documented below in the visit note. 

## 2016-04-20 ENCOUNTER — Other Ambulatory Visit: Payer: Self-pay | Admitting: Family Medicine

## 2016-04-22 ENCOUNTER — Other Ambulatory Visit: Payer: Self-pay | Admitting: Family Medicine

## 2016-06-29 ENCOUNTER — Encounter: Payer: Self-pay | Admitting: Family Medicine

## 2016-06-29 DIAGNOSIS — H35032 Hypertensive retinopathy, left eye: Secondary | ICD-10-CM | POA: Diagnosis not present

## 2016-06-29 DIAGNOSIS — H35031 Hypertensive retinopathy, right eye: Secondary | ICD-10-CM | POA: Diagnosis not present

## 2016-06-29 DIAGNOSIS — H40013 Open angle with borderline findings, low risk, bilateral: Secondary | ICD-10-CM | POA: Diagnosis not present

## 2016-06-29 DIAGNOSIS — Z961 Presence of intraocular lens: Secondary | ICD-10-CM | POA: Diagnosis not present

## 2016-06-29 LAB — HM DIABETES EYE EXAM

## 2016-07-17 ENCOUNTER — Ambulatory Visit (INDEPENDENT_AMBULATORY_CARE_PROVIDER_SITE_OTHER): Payer: Medicare Other | Admitting: Family Medicine

## 2016-07-17 ENCOUNTER — Encounter: Payer: Self-pay | Admitting: Family Medicine

## 2016-07-17 VITALS — BP 120/60 | HR 75 | Temp 97.7°F | Ht 66.0 in | Wt 148.0 lb

## 2016-07-17 DIAGNOSIS — N400 Enlarged prostate without lower urinary tract symptoms: Secondary | ICD-10-CM | POA: Diagnosis not present

## 2016-07-17 DIAGNOSIS — M25512 Pain in left shoulder: Secondary | ICD-10-CM | POA: Diagnosis not present

## 2016-07-17 DIAGNOSIS — I10 Essential (primary) hypertension: Secondary | ICD-10-CM

## 2016-07-17 DIAGNOSIS — E785 Hyperlipidemia, unspecified: Secondary | ICD-10-CM

## 2016-07-17 LAB — LIPID PANEL
Cholesterol: 118 mg/dL (ref 0–200)
HDL: 34.2 mg/dL — ABNORMAL LOW (ref >39.00)
LDL CALC: 69 mg/dL (ref 0–99)
NONHDL: 83.39
Total CHOL/HDL Ratio: 3
Triglycerides: 70 mg/dL (ref 0.0–149.0)
VLDL: 14 mg/dL (ref 0.0–40.0)

## 2016-07-17 LAB — BASIC METABOLIC PANEL
BUN: 14 mg/dL (ref 6–23)
CO2: 31 mEq/L (ref 19–32)
CREATININE: 1.14 mg/dL (ref 0.40–1.50)
Calcium: 9.5 mg/dL (ref 8.4–10.5)
Chloride: 103 mEq/L (ref 96–112)
GFR: 77.71 mL/min (ref >60.00)
GLUCOSE: 88 mg/dL (ref 70–99)
POTASSIUM: 4 meq/L (ref 3.5–5.1)
Sodium: 142 mEq/L (ref 135–145)

## 2016-07-17 LAB — HEPATIC FUNCTION PANEL
ALT: 6 U/L (ref 0–53)
AST: 11 U/L (ref 0–37)
Albumin: 4.3 g/dL (ref 3.5–5.2)
Alkaline Phosphatase: 52 U/L (ref 39–117)
BILIRUBIN TOTAL: 0.7 mg/dL (ref 0.2–1.2)
Bilirubin, Direct: 0.2 mg/dL (ref 0.0–0.3)
Total Protein: 6.9 g/dL (ref 6.0–8.3)

## 2016-07-17 NOTE — Progress Notes (Signed)
Pre visit review using our clinic review tool, if applicable. No additional management support is needed unless otherwise documented below in the visit note. 

## 2016-07-17 NOTE — Progress Notes (Signed)
Subjective:     Patient ID: Joshua Burns, male   DOB: February 06, 1925, 82 y.o.   MRN: [id number]  HPI   Review of Systems     Objective:   Physical Exam     Assessment:         Plan:

## 2016-07-17 NOTE — Progress Notes (Signed)
Subjective:     Patient ID: Joshua Burns, male   DOB: 1925/08/16, 82 y.o.   MRN: [id number]  HPI Patient seen for medical follow-up. Chronic problems include history of hypertension, CAD, BPH, hyperlipidemia. Medications reviewed. Compliant with all. No dizziness. No chest pains. Recent fall with mild left shoulder pain. Pain is worse at night. Still has good range of motion. No weakness. Denies any urinary obstructive symptoms. Wife states he has poor appetite but his weight is actually unchanged from last winter  Past Medical History:  Diagnosis Date  . Arthritis   . CAD (coronary artery disease)   . Glaucoma   . HYPERLIPIDEMIA 01/28/2010  . HYPERTENSION 01/28/2010  . HYPERTROPHY PROSTATE W/UR OBST & OTH LUTS 01/28/2010   Past Surgical History:  Procedure Laterality Date  . ANGIOPLASTY  2004  . CARDIOVASCULAR STRESS TEST  08-04-2008   EF 0%  . [place] SURGERY  2004  . HERNIA REPAIR  2006  . PROSTATE BIOPSY  03/2008  . PROSTATE SURGERY  2009   bx  . US ECHOCARDIOGRAPHY  08-04-2008   Est EF 55-60%    reports that he quit smoking about 37 years ago. He has never used smokeless tobacco. He reports that he does not drink alcohol or use drugs. family history includes Aneurysm in his brother; Heart disease in his father and mother. Allergies  Allergen Reactions  . Ace Inhibitors Swelling  . Diltiazem Swelling and Palpitations     Review of Systems  Constitutional: Positive for appetite change. Negative for fatigue and unexpected weight change.  Eyes: Negative for visual disturbance.  Respiratory: Negative for cough, chest tightness and shortness of breath.   Cardiovascular: Negative for chest pain, palpitations and leg swelling.  Neurological: Negative for dizziness, syncope, weakness, light-headedness and headaches.  Psychiatric/Behavioral: Negative for confusion and dysphoric mood.       Objective:   Physical Exam  Constitutional: He is oriented to person, place, and  time. He appears well-developed and well-nourished.  HENT:  Right Ear: External ear normal.  Left Ear: External ear normal.  Mouth/Throat: Oropharynx is clear and moist.  Eyes: Pupils are equal, round, and reactive to light.  Neck: Neck supple. No thyromegaly present.  Cardiovascular: Normal rate and regular rhythm.   Pulmonary/Chest: Effort normal and breath sounds normal. No respiratory distress. He has no wheezes. He has no rales.  Musculoskeletal: He exhibits no edema.  Left shoulder full range of motion. No localized tenderness.  Neurological: He is alert and oriented to person, place, and time.  No rotator cuff weakness left shoulder       Assessment:     #1 hypertension stable and at goal  #2 hyperlipidemia. Patient has history of CAD. Recheck lipid panel  #3 BPH. Symptomatically stable. Continue Cardura  #4 left shoulder pain. Suspect rotator cuff tendinitis. Doubt rotator cuff tear    Plan:     -Check basic metabolic panel, lipid panel, hepatic panel -Continue current medications. -Consider follow-up in one month if left shoulder not improved. Work on range of motion in the meantime to avoid adhesive capsulitis  Eulas Post MD [facility] at [facility]

## 2016-08-23 ENCOUNTER — Other Ambulatory Visit: Payer: Self-pay

## 2016-09-17 DIAGNOSIS — Z23 Encounter for immunization: Secondary | ICD-10-CM | POA: Diagnosis not present

## 2016-10-31 ENCOUNTER — Encounter: Payer: Self-pay | Admitting: Family Medicine

## 2016-10-31 ENCOUNTER — Ambulatory Visit (INDEPENDENT_AMBULATORY_CARE_PROVIDER_SITE_OTHER): Payer: Medicare Other | Admitting: Family Medicine

## 2016-10-31 VITALS — BP 150/60 | HR 70 | Temp 97.7°F | Ht 66.0 in | Wt 145.9 lb

## 2016-10-31 DIAGNOSIS — R4189 Other symptoms and signs involving cognitive functions and awareness: Secondary | ICD-10-CM

## 2016-10-31 DIAGNOSIS — R413 Other amnesia: Secondary | ICD-10-CM

## 2016-10-31 LAB — TSH: TSH: 0.82 u[IU]/mL (ref 0.35–4.50)

## 2016-10-31 LAB — VITAMIN B12: Vitamin B-12: 1343 pg/mL — ABNORMAL HIGH (ref 211–911)

## 2016-10-31 NOTE — Patient Instructions (Signed)
Cognitive Tips  Keep a journal/notebook with sections for the following (or use sections separately as needed):  Calendar and appointment sheet, schedule for each day, lists of reminders (such as grocery lists or "to do" list), homework assignments for therapy, important information such as family and friends names / addresses / phone numbers, medications, medical history and doctors name / phone numbers.  Avoid / remove clutter and unnecessary items from areas such as countertops / cabinets in kitchen and bathroom, closets, etc.  Organize items by purpose.  Baskets and bins help with this.  Leave notes for reminders above task to be completed.  For example: Note to turn off stove over the the stove; note to lock door beside the door, not to brush teeth then wash face by sink, note to take medication on table etc.  To help recall names of people of people or things, mentally or verbally go through the alphabet to try to determine the 1st letter of the word as this may trigger the name or word you are looking for.  If this is too difficult and someone else knows the word, have them give you the first letter by asking, "does it start with an "A", "B", "C" etc. (or have them give you the first sound of the word or some other clue).  Review family events, occasions, names, etc.  Pictures are a good way to trigger memory.  Have others correct you if you answer something incorrectly.  Have them speak slowly with a few words to give you time to process and respond.  Don't let others automatically problem-solve. (For example: don't let them automatically lay out clothes in the correct position, but hand it to you folded so that you can figure it out for yourself.)  However, if you need help with tasks, they should give you as little as they can so that you can be successful.  If appropriate and safe, they may allow you to make mistakes so that you can figure out how to correct the error.  (For example, they  may allow you to put your shoes on the wrong foot to see if you notice that is wrong).  If you struggle, they should give you a cue.  (Example: "Do your shoes feel right?"  "Do they look right?") 

## 2016-10-31 NOTE — Progress Notes (Signed)
Subjective:     Patient ID: Joshua Burns, male   DOB: Dec 20, 1925, 82 y.o.   MRN: [id number]  HPI Patient here with concerns for possible cognitive impairment. His wife has metastatic uterine cancer and is on hospice and has had increased stress related to that. His wife has also expressed concerns that he was repeating the same questions over and over and starting to have more difficulty sometimes with driving. He is here today with one of his sonsElta Guadeloupe) who lives in [place] but here to stay with them during his mom's cancer. Son has also noticed that his short-term memory since to be impaired. No recent head injury. He denies any headache. No appetite or weight changes. His chronic problems include history of CAD, BPH, hyperlipidemia, glaucoma, hypertension  Past Medical History:  Diagnosis Date  . Arthritis   . CAD (coronary artery disease)   . Glaucoma   . HYPERLIPIDEMIA 01/28/2010  . HYPERTENSION 01/28/2010  . HYPERTROPHY PROSTATE W/UR OBST & OTH LUTS 01/28/2010   Past Surgical History:  Procedure Laterality Date  . ANGIOPLASTY  2004  . CARDIOVASCULAR STRESS TEST  08-04-2008   EF 0%  . [place] SURGERY  2004  . HERNIA REPAIR  2006  . PROSTATE BIOPSY  03/2008  . PROSTATE SURGERY  2009   bx  . US ECHOCARDIOGRAPHY  08-04-2008   Est EF 55-60%    reports that he quit smoking about 37 years ago. He has never used smokeless tobacco. He reports that he does not drink alcohol or use drugs. family history includes Aneurysm in his brother; Heart disease in his father and mother. Allergies  Allergen Reactions  . Ace Inhibitors Swelling  . Diltiazem Swelling and Palpitations     Review of Systems  Constitutional: Negative for chills and fever.  Respiratory: Negative for cough and shortness of breath.   Cardiovascular: Negative for chest pain.  Endocrine: Negative for cold intolerance, heat intolerance, polydipsia and polyuria.  Genitourinary: Negative for dysuria.  Neurological:  Negative for dizziness, seizures, syncope, weakness and headaches.  Psychiatric/Behavioral: Negative for agitation and dysphoric mood.       Objective:   Physical Exam  Constitutional: He is oriented to person, place, and time. He appears well-developed and well-nourished.  Cardiovascular: Normal rate and regular rhythm.   Pulmonary/Chest: Effort normal and breath sounds normal. No respiratory distress. He has no wheezes. He has no rales.  Neurological: He is alert and oriented to person, place, and time. No cranial nerve deficit.  Psychiatric: He has a normal mood and affect. His behavior is normal. Judgment and thought content normal.  MMSE 24/30       Assessment:     Cognitive impairment. Not assessed previously.  Per family, he's had some gradual changes for quite some time and concern is whether he has Alzheimer's dementia    Plan:     -Check labs with TSH and B12 levels -Consider initiating Aricept but wait on lab work above first -We will determine follow-up interval after obtaining labs above -Discussed with son the fact that he'll need closer supervision with things like medication and also likely that he'll not be able to drive in the very near future -We also discussed other safety issues like reducing fire risk from stove.  Eulas Post MD [facility] at [facility]

## 2016-11-01 ENCOUNTER — Telehealth: Payer: Self-pay | Admitting: Family Medicine

## 2016-11-01 ENCOUNTER — Other Ambulatory Visit: Payer: Self-pay

## 2016-11-01 MED ORDER — DONEPEZIL HCL 5 MG PO TABS: 5.0000 mg | ORAL_TABLET | Freq: Every day | ORAL | 5 refills | Status: DC

## 2016-11-01 NOTE — Telephone Encounter (Signed)
Son states he needs a letter stating pt is competent to sign forms, such as power of attorney.  Son would like to pick up when ready.Marland Kitchen

## 2016-11-03 ENCOUNTER — Encounter: Payer: Self-pay | Admitting: Family Medicine

## 2016-12-05 ENCOUNTER — Ambulatory Visit: Payer: Medicare Other | Admitting: Family Medicine

## 2016-12-05 ENCOUNTER — Encounter: Payer: Self-pay | Admitting: Family Medicine

## 2016-12-05 ENCOUNTER — Ambulatory Visit (INDEPENDENT_AMBULATORY_CARE_PROVIDER_SITE_OTHER): Payer: Medicare Other | Admitting: Family Medicine

## 2016-12-05 VITALS — BP 140/80 | HR 77 | Temp 97.6°F | Ht 66.0 in | Wt 151.6 lb

## 2016-12-05 DIAGNOSIS — Z23 Encounter for immunization: Secondary | ICD-10-CM | POA: Diagnosis not present

## 2016-12-05 DIAGNOSIS — R4189 Other symptoms and signs involving cognitive functions and awareness: Secondary | ICD-10-CM | POA: Diagnosis not present

## 2016-12-05 DIAGNOSIS — I10 Essential (primary) hypertension: Secondary | ICD-10-CM | POA: Diagnosis not present

## 2016-12-05 HISTORY — DX: Other symptoms and signs involving cognitive functions and awareness: R41.89

## 2016-12-05 MED ORDER — DONEPEZIL HCL 10 MG PO TABS: 10.0000 mg | ORAL_TABLET | Freq: Every day | ORAL | 3 refills | Status: DC

## 2016-12-05 NOTE — Progress Notes (Signed)
Subjective:     Patient ID: Joshua Burns, male   DOB: 05-Jan-1925, 82 y.o.   MRN: [id number]  HPI Patient seen for follow-up or guarding cognitive impairment. Recent MMSE 24/30. We started Aricept 5 mg once daily. He's had no side effects. B12 and TSH normal.  He's gained about 5 pounds since last visit and his son attributes this to increased help at home. Unfortunately, his wife passed way his past Saturday of complications of uterine cancer. He is coping fairly well. Had been married for 63 years.  Patient had some decreased sleep at times. Overall feels well. Flu vaccine already given. Needs Prevnar 13. Other immunizations up-to-date with exception of tetanus  Past Medical History:  Diagnosis Date  . Arthritis   . CAD (coronary artery disease)   . Glaucoma   . HYPERLIPIDEMIA 01/28/2010  . HYPERTENSION 01/28/2010  . HYPERTROPHY PROSTATE W/UR OBST & OTH LUTS 01/28/2010   Past Surgical History:  Procedure Laterality Date  . ANGIOPLASTY  2004  . CARDIOVASCULAR STRESS TEST  08-04-2008   EF 0%  . [place] SURGERY  2004  . HERNIA REPAIR  2006  . PROSTATE BIOPSY  03/2008  . PROSTATE SURGERY  2009   bx  . US ECHOCARDIOGRAPHY  08-04-2008   Est EF 55-60%    reports that he quit smoking about 37 years ago. He has never used smokeless tobacco. He reports that he does not drink alcohol or use drugs. family history includes Aneurysm in his brother; Heart disease in his father and mother. Allergies  Allergen Reactions  . Ace Inhibitors Swelling  . Diltiazem Swelling and Palpitations     Review of Systems  Constitutional: Negative for appetite change and unexpected weight change.  Respiratory: Negative for shortness of breath.   Cardiovascular: Negative for chest pain.       Objective:   Physical Exam  Constitutional: He appears well-developed and well-nourished.  Cardiovascular: Normal rate and regular rhythm.   Pulmonary/Chest: Effort normal and breath sounds normal. No respiratory  distress. He has no wheezes. He has no rales.  Neurological: He is alert.  Psychiatric: He has a normal mood and affect. His behavior is normal.       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

## 2016-12-05 NOTE — Progress Notes (Signed)
Pre visit review using our clinic review tool, if applicable. No additional management support is needed unless otherwise documented below in the visit note. 

## 2016-12-20 ENCOUNTER — Telehealth: Payer: Self-pay

## 2016-12-20 NOTE — Telephone Encounter (Signed)
Call to home to scheduled AWV and no answer today No answering machine

## 2016-12-29 DIAGNOSIS — H40013 Open angle with borderline findings, low risk, bilateral: Secondary | ICD-10-CM | POA: Diagnosis not present

## 2017-01-15 ENCOUNTER — Ambulatory Visit: Payer: Medicare Other | Admitting: Family Medicine

## 2017-01-24 ENCOUNTER — Other Ambulatory Visit: Payer: Self-pay | Admitting: Family Medicine

## 2017-01-27 ENCOUNTER — Encounter (HOSPITAL_COMMUNITY): Payer: Self-pay | Admitting: Emergency Medicine

## 2017-01-27 ENCOUNTER — Emergency Department (HOSPITAL_COMMUNITY)
Admission: EM | Admit: 2017-01-27 | Discharge: 2017-01-27 | Disposition: A | Discharge status: Home / Self Care | Payer: Medicare Other | Attending: Emergency Medicine | Admitting: Emergency Medicine

## 2017-01-27 DIAGNOSIS — I10 Essential (primary) hypertension: Secondary | ICD-10-CM | POA: Diagnosis not present

## 2017-01-27 DIAGNOSIS — M546 Pain in thoracic spine: Secondary | ICD-10-CM | POA: Insufficient documentation

## 2017-01-27 DIAGNOSIS — I2510 Atherosclerotic heart disease of native coronary artery without angina pectoris: Secondary | ICD-10-CM | POA: Insufficient documentation

## 2017-01-27 DIAGNOSIS — M791 Myalgia: Secondary | ICD-10-CM | POA: Diagnosis not present

## 2017-01-27 DIAGNOSIS — Z79899 Other long term (current) drug therapy: Secondary | ICD-10-CM | POA: Insufficient documentation

## 2017-01-27 DIAGNOSIS — Z87891 Personal history of nicotine dependence: Secondary | ICD-10-CM | POA: Insufficient documentation

## 2017-01-27 DIAGNOSIS — Z7982 Long term (current) use of aspirin: Secondary | ICD-10-CM | POA: Insufficient documentation

## 2017-01-27 DIAGNOSIS — M7918 Myalgia, other site: Secondary | ICD-10-CM

## 2017-01-27 NOTE — ED Provider Notes (Signed)
[facility] DEPT Provider Note   CSN: [id number] Arrival date & time: 01/27/17  1158     History   Chief Complaint Chief Complaint  Patient presents with  . Shoulder Pain    HPI Joshua Burns is a 83 y.o. male with history of arthritis presents to the ED with complaints of right upper back pain radiates to upper posterior neck and to right shoulder starting this morning. He states it is similar to previous pain but more severe. He states today's pain is lasting longer and constant numbing mild-moderate 3/10 pain with a sharp pain that lasts several seconds, severe, and intermittent. He states the pain is worse when he bends and turns his head to the right. He sates he has tried aspirin cream that he usually uses for his pain, but has not helped relieve his pain. Patient denies chest pain, SOB, fevers, chills, nausea, vomiting, headache, recent trauma.   The history is provided by the patient and a relative. No language interpreter was used.  Shoulder Pain      Past Medical History:  Diagnosis Date  . Arthritis   . CAD (coronary artery disease)   . Glaucoma   . HYPERLIPIDEMIA 01/28/2010  . HYPERTENSION 01/28/2010  . HYPERTROPHY PROSTATE W/UR OBST & OTH LUTS 01/28/2010    Patient Active Problem List   Diagnosis Date Noted  . Cognitive impairment 12/05/2016  . 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 Glaucoma 06/13/2012  . Hyperlipidemia 01/28/2010  . Essential hypertension 01/28/2010  . BPH (benign prostatic hyperplasia) 01/28/2010  . CAD (coronary artery disease) 08/22/2003    Past Surgical History:  Procedure Laterality Date  . ANGIOPLASTY  2004  . CARDIOVASCULAR STRESS TEST  08-04-2008   EF 0%  . [place] SURGERY  2004  . HERNIA REPAIR  2006  . PROSTATE BIOPSY  03/2008  . PROSTATE SURGERY  2009   bx  . US ECHOCARDIOGRAPHY  08-04-2008   Est EF 55-60%       Home Medications    Prior to Admission medications   Medication Sig Start Date End Date  Taking? Authorizing Provider  aspirin EC 325 MG tablet Take 325 mg by mouth every morning.   Yes Historical Provider, MD  donepezil (ARICEPT) 10 MG tablet Take 1 tablet (10 mg total) by mouth at bedtime. 12/05/16  Yes Eulas Post, MD  doxazosin (CARDURA) 4 MG tablet TAKE 1 TABLET BY MOUTH AT BEDTIME 01/24/17  Yes Eulas Post, MD  hydrochlorothiazide (HYDRODIURIL) 12.5 MG tablet TAKE 1 TABLET EVERY DAY 01/24/17  Yes Eulas Post, MD  Multiple Vitamins-Minerals (MENS 50+ MULTI VITAMIN/MIN PO) Take 1 tablet by mouth daily.    Yes Historical Provider, MD  simvastatin (ZOCOR) 20 MG tablet TAKE 1 TABLET BY MOUTH EVERY DAY AT BEDTIME Patient taking differently: TAKE 20MG  BY MOUTH EVERY DAY 04/24/16  Yes Eulas Post, MD    Family History Family History  Problem Relation Age of Onset  . Heart disease Mother   . Heart disease Father   . Aneurysm Brother     Social History Social History  Substance Use Topics  . Smoking status: Former Smoker    Quit date: 12/25/1978  . Smokeless tobacco: Never Used  . Alcohol use No     Allergies   Ace inhibitors and Diltiazem   Review of Systems Review of Systems  Constitutional: Negative for chills and fever.  Respiratory: Negative for shortness of breath.   Cardiovascular: Negative for chest pain.  Gastrointestinal:  Negative for abdominal pain, diarrhea, nausea and vomiting.  Genitourinary: Negative for difficulty urinating and dysuria.  Musculoskeletal: Positive for myalgias and neck pain (Right side. No midline tenderness). Negative for neck stiffness.  Skin: Negative for rash and wound.  Neurological: Negative for headaches.  All other systems reviewed and are negative.    Physical Exam Updated Vital Signs BP 155/73 (BP Location: Right Arm)   Pulse 76   Temp 97.8 F (36.6 C) (Oral)   Resp 16   SpO2 99%   Physical Exam  Constitutional: He is oriented to person, place, and time. He appears well-developed and  well-nourished.  Well appearing  HENT:  Head: Normocephalic and atraumatic.  Mouth/Throat: Oropharynx is clear and moist.  Eyes: EOM are normal. Pupils are equal, round, and reactive to light.  Neck: Normal range of motion.  Good ROM. No nuchal rigidity.  Cardiovascular: Normal rate, normal heart sounds and intact distal pulses.   Distal pulses intact bilaterally.   Pulmonary/Chest: Effort normal and breath sounds normal. No respiratory distress. He has no wheezes.  Normal work of breathing  Abdominal: Soft. There is no tenderness. There is no rebound and no guarding.  Musculoskeletal: Normal range of motion. He exhibits no deformity.  No midline tenderness to cervical, thoracic, and lumbar area. Good range of motion of right shoulder. No right shoulder tenderness to palpation. No deformity noted. Tenderness to the right upper back on right upper trapezius muscle.   Neurological: He is alert and oriented to person, place, and time.  Sensation intact to upper extremities. Muscle strength 5/5 bilaterally to upper extremities bilaterally.   Skin: Skin is warm.  Psychiatric: He has a normal mood and affect. His behavior is normal.  Nursing note and vitals reviewed.    ED Treatments / Results  Labs (all labs ordered are listed, but only abnormal results are displayed) Labs Reviewed - No data to display  EKG  EKG Interpretation None       Radiology No results found.  Procedures Procedures (including critical care time)  Medications Ordered in ED Medications - No data to display   Initial Impression / Assessment and Plan / ED Course  I have reviewed the triage vital signs and the nursing notes.  Pertinent labs & imaging results that were available during my care of the patient were reviewed by me and considered in my medical decision making (see chart for details).    Patient is an 83 y.o male presenting today with likely musculoskeletal pain/spasm at his upper right back  and neck starting this morning. Low suspicion for fracture, dislocation, meningitis, SAH. Pt afebrile, NAD, VSS. Presentation similar to previous but more intense. Heart and lung sounds clear. Abdomen soft and nontender. No rebound or guarding. No cervical, thoracic, or lumbar midline spine tenderness. No trauma or previous injury, or surgery to the area. Neck has good ROM and no nuchal rigidity noted. Right shoulder has good ROM, sensation intact to upper extremities bilaterally and muscle strength 5/5 bilaterally. Distal pulses intact. I do not think xray is necessary at this time due to patients history and exam findings. I feel safe for discharge home. Pt verbalizes understanding and agrees with assessment and plan. Pt given instruction to use tylenol as needed for pain as well as warm and cold compress. Pt also instructed to follow up with PCP on Monday regarding today's visit.   Final Clinical Impressions(s) / ED Diagnoses   Final diagnoses:  Musculoskeletal pain    New Prescriptions Discharge  Medication List as of 01/27/2017  1:53 PM       [place], [place] 01/27/17 2324    Varney Biles, MD 01/29/17 [phone]

## 2017-01-27 NOTE — Discharge Instructions (Signed)
Please use Tylenol every 6 hours as needed for pain. Use ice/heat compression to the area for 15-20 minutes 3-5 times a day. Stretch area everyday. Follow up with your primary care provider in one week as needed.  Contact a health care provider if: Your pain is getting worse. Your pain is not relieved with medicines. You lose function in the area of the pain if the pain is in your arms, legs, or neck.

## 2017-01-27 NOTE — ED Triage Notes (Signed)
Pt complaint of right shoulder pain worsening over past three days; hx of right should injury.

## 2017-01-27 NOTE — ED Notes (Signed)
Patient c/o being in a hall bed. Patient stated, "I pay good money and you stick me in the hall."

## 2017-01-29 ENCOUNTER — Telehealth: Payer: Self-pay

## 2017-01-29 NOTE — Telephone Encounter (Signed)
Call to [facility] and spoke with son. Stated that he needs AWV but also a fup apt with Dr. Elease Hashimoto. Agreed to call back in April and schedule with Dr. Elease Hashimoto and AWV together.

## 2017-02-16 ENCOUNTER — Ambulatory Visit (INDEPENDENT_AMBULATORY_CARE_PROVIDER_SITE_OTHER): Payer: Medicare Other | Admitting: Family Medicine

## 2017-02-16 ENCOUNTER — Encounter: Payer: Self-pay | Admitting: Family Medicine

## 2017-02-16 VITALS — BP 140/60 | HR 76 | Ht 66.0 in | Wt 146.0 lb

## 2017-02-16 DIAGNOSIS — M47812 Spondylosis without myelopathy or radiculopathy, cervical region: Secondary | ICD-10-CM

## 2017-02-16 NOTE — Patient Instructions (Signed)
We will set up follow up with Dr Nelva Bush.

## 2017-02-16 NOTE — Progress Notes (Signed)
Pre visit review using our clinic review tool, if applicable. No additional management support is needed unless otherwise documented below in the visit note. 

## 2017-02-16 NOTE — Progress Notes (Signed)
Subjective:     Patient ID: Joshua Burns, male   DOB: [birth year], 83 y.o.   MRN: [id number]  HPI Patient seen with several month history of right sided neck pain radiating from the lower cervical region toward the trapezius and shoulder. He denies any right upper extremity pain beyond the shoulder. He states he fell about a year ago and pain seemed worse after that. He has history of known cervical spondylosis and has seen [facility] orthopedics previously and reportedly has had what sounds like epidurals in the past. He denies any upper extremity numbness or weakness. His pain is severe at times and exacerbated sometimes by head movement. No alleviating factors. Recently went to the ER but no x-rays were done. He does not recall if he had recent cervical MRI at [place] [facility]. Last cervical MRI on record in chart was 2004  Past Medical History:  Diagnosis Date  . Arthritis   . CAD (coronary artery disease)   . Glaucoma   . HYPERLIPIDEMIA 01/28/2010  . HYPERTENSION 01/28/2010  . HYPERTROPHY PROSTATE W/UR OBST & OTH LUTS 01/28/2010   Past Surgical History:  Procedure Laterality Date  . ANGIOPLASTY  2004  . CARDIOVASCULAR STRESS TEST  08-04-2008   EF 0%  . [place] SURGERY  2004  . HERNIA REPAIR  2006  . PROSTATE BIOPSY  03/2008  . PROSTATE SURGERY  2009   bx  . US ECHOCARDIOGRAPHY  08-04-2008   Est EF 55-60%    reports that he quit smoking about 38 years ago. He has never used smokeless tobacco. He reports that he does not drink alcohol or use drugs. family history includes Aneurysm in his brother; Heart disease in his father and mother. Allergies  Allergen Reactions  . Ace Inhibitors Swelling  . Diltiazem Swelling and Palpitations     Review of Systems  Respiratory: Negative for shortness of breath.   Cardiovascular: Negative for chest pain.  Musculoskeletal: Positive for neck pain.  Neurological: Negative for weakness and numbness.       Objective:   Physical Exam   Constitutional: He appears well-developed and well-nourished. No distress.  Cardiovascular: Normal rate and regular rhythm.   Pulmonary/Chest: Effort normal and breath sounds normal. No respiratory distress. He has no wheezes. He has no rales.  Musculoskeletal: He exhibits no edema.  Neurological:  Full strength upper extremities. He has symmetric reflexes. Normal sensory function throughout       Assessment:     Right-sided neck pain. Suspect related to cervical spondylosis    Plan:     -Referral back to [place] -Avoid nonsteroidals given his age and hypertension history  Eulas Post MD [facility]

## 2017-05-05 ENCOUNTER — Other Ambulatory Visit: Payer: Self-pay | Admitting: Family Medicine

## 2017-07-02 ENCOUNTER — Encounter: Payer: Self-pay | Admitting: Family Medicine

## 2017-07-02 DIAGNOSIS — Z961 Presence of intraocular lens: Secondary | ICD-10-CM | POA: Diagnosis not present

## 2017-07-02 DIAGNOSIS — H3562 Retinal hemorrhage, left eye: Secondary | ICD-10-CM | POA: Diagnosis not present

## 2017-07-02 DIAGNOSIS — H40013 Open angle with borderline findings, low risk, bilateral: Secondary | ICD-10-CM | POA: Diagnosis not present

## 2017-07-02 DIAGNOSIS — H35033 Hypertensive retinopathy, bilateral: Secondary | ICD-10-CM | POA: Diagnosis not present

## 2017-08-17 DIAGNOSIS — Z87891 Personal history of nicotine dependence: Secondary | ICD-10-CM

## 2017-08-17 DIAGNOSIS — Z7982 Long term (current) use of aspirin: Secondary | ICD-10-CM

## 2017-08-17 DIAGNOSIS — E876 Hypokalemia: Secondary | ICD-10-CM | POA: Diagnosis present

## 2017-08-17 DIAGNOSIS — W19XXXA Unspecified fall, initial encounter: Secondary | ICD-10-CM | POA: Diagnosis present

## 2017-08-17 DIAGNOSIS — I441 Atrioventricular block, second degree: Secondary | ICD-10-CM | POA: Diagnosis present

## 2017-08-17 DIAGNOSIS — I10 Essential (primary) hypertension: Secondary | ICD-10-CM | POA: Diagnosis present

## 2017-08-17 DIAGNOSIS — Z8249 Family history of ischemic heart disease and other diseases of the circulatory system: Secondary | ICD-10-CM

## 2017-08-17 DIAGNOSIS — S299XXA Unspecified injury of thorax, initial encounter: Secondary | ICD-10-CM | POA: Diagnosis not present

## 2017-08-17 DIAGNOSIS — I498 Other specified cardiac arrhythmias: Secondary | ICD-10-CM | POA: Diagnosis not present

## 2017-08-17 DIAGNOSIS — S0990XA Unspecified injury of head, initial encounter: Secondary | ICD-10-CM | POA: Diagnosis not present

## 2017-08-17 DIAGNOSIS — I071 Rheumatic tricuspid insufficiency: Secondary | ICD-10-CM | POA: Diagnosis present

## 2017-08-17 DIAGNOSIS — S199XXA Unspecified injury of neck, initial encounter: Secondary | ICD-10-CM | POA: Diagnosis not present

## 2017-08-17 DIAGNOSIS — R55 Syncope and collapse: Secondary | ICD-10-CM | POA: Diagnosis not present

## 2017-08-17 DIAGNOSIS — Z888 Allergy status to other drugs, medicaments and biological substances status: Secondary | ICD-10-CM

## 2017-08-17 DIAGNOSIS — D61818 Other pancytopenia: Secondary | ICD-10-CM | POA: Diagnosis not present

## 2017-08-17 DIAGNOSIS — G3184 Mild cognitive impairment, so stated: Secondary | ICD-10-CM | POA: Diagnosis present

## 2017-08-17 DIAGNOSIS — I2510 Atherosclerotic heart disease of native coronary artery without angina pectoris: Secondary | ICD-10-CM | POA: Diagnosis present

## 2017-08-17 DIAGNOSIS — I455 Other specified heart block: Secondary | ICD-10-CM | POA: Diagnosis not present

## 2017-08-17 DIAGNOSIS — I495 Sick sinus syndrome: Secondary | ICD-10-CM | POA: Diagnosis not present

## 2017-08-17 DIAGNOSIS — S01112A Laceration without foreign body of left eyelid and periocular area, initial encounter: Secondary | ICD-10-CM | POA: Diagnosis present

## 2017-08-17 DIAGNOSIS — Z23 Encounter for immunization: Secondary | ICD-10-CM | POA: Diagnosis not present

## 2017-08-17 DIAGNOSIS — E785 Hyperlipidemia, unspecified: Secondary | ICD-10-CM | POA: Diagnosis present

## 2017-08-18 ENCOUNTER — Inpatient Hospital Stay (HOSPITAL_COMMUNITY)
Admission: EM | Admit: 2017-08-18 | Discharge: 2017-08-21 | DRG: 261 | Disposition: A | Discharge status: Home / Self Care | Payer: Medicare Other | Attending: Cardiology | Admitting: Cardiology

## 2017-08-18 ENCOUNTER — Encounter (HOSPITAL_COMMUNITY): Payer: Self-pay | Admitting: Emergency Medicine

## 2017-08-18 ENCOUNTER — Emergency Department (HOSPITAL_COMMUNITY): Payer: Medicare Other

## 2017-08-18 DIAGNOSIS — W19XXXA Unspecified fall, initial encounter: Secondary | ICD-10-CM

## 2017-08-18 DIAGNOSIS — D696 Thrombocytopenia, unspecified: Secondary | ICD-10-CM | POA: Diagnosis not present

## 2017-08-18 DIAGNOSIS — S299XXA Unspecified injury of thorax, initial encounter: Secondary | ICD-10-CM | POA: Diagnosis not present

## 2017-08-18 DIAGNOSIS — D61818 Other pancytopenia: Secondary | ICD-10-CM | POA: Diagnosis present

## 2017-08-18 DIAGNOSIS — E785 Hyperlipidemia, unspecified: Secondary | ICD-10-CM | POA: Diagnosis present

## 2017-08-18 DIAGNOSIS — R9431 Abnormal electrocardiogram [ECG] [EKG]: Secondary | ICD-10-CM | POA: Diagnosis not present

## 2017-08-18 DIAGNOSIS — I495 Sick sinus syndrome: Secondary | ICD-10-CM | POA: Diagnosis not present

## 2017-08-18 DIAGNOSIS — S01112A Laceration without foreign body of left eyelid and periocular area, initial encounter: Secondary | ICD-10-CM | POA: Diagnosis present

## 2017-08-18 DIAGNOSIS — Z7982 Long term (current) use of aspirin: Secondary | ICD-10-CM | POA: Diagnosis not present

## 2017-08-18 DIAGNOSIS — G3184 Mild cognitive impairment, so stated: Secondary | ICD-10-CM | POA: Diagnosis present

## 2017-08-18 DIAGNOSIS — D649 Anemia, unspecified: Secondary | ICD-10-CM | POA: Diagnosis not present

## 2017-08-18 DIAGNOSIS — I455 Other specified heart block: Secondary | ICD-10-CM | POA: Diagnosis not present

## 2017-08-18 DIAGNOSIS — Z23 Encounter for immunization: Secondary | ICD-10-CM | POA: Diagnosis not present

## 2017-08-18 DIAGNOSIS — Z888 Allergy status to other drugs, medicaments and biological substances status: Secondary | ICD-10-CM | POA: Diagnosis not present

## 2017-08-18 DIAGNOSIS — I441 Atrioventricular block, second degree: Secondary | ICD-10-CM | POA: Diagnosis present

## 2017-08-18 DIAGNOSIS — D473 Essential (hemorrhagic) thrombocythemia: Secondary | ICD-10-CM | POA: Diagnosis not present

## 2017-08-18 DIAGNOSIS — I2510 Atherosclerotic heart disease of native coronary artery without angina pectoris: Secondary | ICD-10-CM | POA: Diagnosis present

## 2017-08-18 DIAGNOSIS — E876 Hypokalemia: Secondary | ICD-10-CM | POA: Diagnosis not present

## 2017-08-18 DIAGNOSIS — S199XXA Unspecified injury of neck, initial encounter: Secondary | ICD-10-CM | POA: Diagnosis not present

## 2017-08-18 DIAGNOSIS — Z8249 Family history of ischemic heart disease and other diseases of the circulatory system: Secondary | ICD-10-CM | POA: Diagnosis not present

## 2017-08-18 DIAGNOSIS — I10 Essential (primary) hypertension: Secondary | ICD-10-CM

## 2017-08-18 DIAGNOSIS — R55 Syncope and collapse: Secondary | ICD-10-CM | POA: Diagnosis not present

## 2017-08-18 DIAGNOSIS — I071 Rheumatic tricuspid insufficiency: Secondary | ICD-10-CM | POA: Diagnosis present

## 2017-08-18 DIAGNOSIS — Z87891 Personal history of nicotine dependence: Secondary | ICD-10-CM | POA: Diagnosis not present

## 2017-08-18 DIAGNOSIS — S0990XA Unspecified injury of head, initial encounter: Secondary | ICD-10-CM | POA: Diagnosis not present

## 2017-08-18 HISTORY — DX: Sick sinus syndrome: I49.5

## 2017-08-18 LAB — URINALYSIS, ROUTINE W REFLEX MICROSCOPIC
BILIRUBIN URINE: NEGATIVE
GLUCOSE, UA: NEGATIVE mg/dL
HGB URINE DIPSTICK: NEGATIVE
Ketones, ur: NEGATIVE mg/dL
Leukocytes, UA: NEGATIVE
NITRITE: NEGATIVE
PROTEIN: NEGATIVE mg/dL
Squamous Epithelial / LPF: NONE SEEN
pH: 7 (ref 5.0–8.0)

## 2017-08-18 LAB — CBC WITH DIFFERENTIAL/PLATELET
BASOS ABS: 0 10*3/uL (ref 0.0–0.1)
BASOS PCT: 0 %
Eosinophils Absolute: 0 10*3/uL (ref 0.0–0.7)
Eosinophils Relative: 1 %
HEMATOCRIT: 28.4 % — AB (ref 39.0–52.0)
HEMOGLOBIN: 9.3 g/dL — AB (ref 13.0–17.0)
Lymphocytes Relative: 28 %
Lymphs Abs: 1.4 10*3/uL (ref 0.7–4.0)
MCH: 28.6 pg (ref 26.0–34.0)
MCHC: 32.7 g/dL (ref 30.0–36.0)
MCV: 87.4 fL (ref 78.0–100.0)
Monocytes Absolute: 0.8 10*3/uL (ref 0.1–1.0)
Monocytes Relative: 15 %
NEUTROS ABS: 2.8 10*3/uL (ref 1.7–7.7)
NEUTROS PCT: 56 %
Platelets: 55 10*3/uL — ABNORMAL LOW (ref 150–400)
RBC: 3.25 MIL/uL — AB (ref 4.22–5.81)
RDW: 15.5 % (ref 11.5–15.5)
WBC: 5.1 10*3/uL (ref 4.0–10.5)

## 2017-08-18 LAB — I-STAT CHEM 8, ED
BUN: 9 mg/dL (ref 6–20)
CREATININE: 1.2 mg/dL (ref 0.61–1.24)
Calcium, Ion: 1.16 mmol/L (ref 1.15–1.40)
Chloride: 100 mmol/L — ABNORMAL LOW (ref 101–111)
Glucose, Bld: 99 mg/dL (ref 65–99)
HEMATOCRIT: 27 % — AB (ref 39.0–52.0)
Hemoglobin: 9.2 g/dL — ABNORMAL LOW (ref 13.0–17.0)
POTASSIUM: 3.5 mmol/L (ref 3.5–5.1)
SODIUM: 141 mmol/L (ref 135–145)
TCO2: 29 mmol/L (ref 22–32)

## 2017-08-18 LAB — HEMOGLOBIN A1C
Hgb A1c MFr Bld: 4.9 % (ref 4.8–5.6)
Mean Plasma Glucose: 93.93 mg/dL

## 2017-08-18 LAB — LIPID PANEL
CHOL/HDL RATIO: 2.4 ratio
CHOLESTEROL: 107 mg/dL (ref 0–200)
HDL: 44 mg/dL (ref >40)
LDL Cholesterol: 55 mg/dL (ref 0–99)
TRIGLYCERIDES: 39 mg/dL (ref <150)
VLDL: 8 mg/dL (ref 0–40)

## 2017-08-18 LAB — TSH: TSH: 1.276 u[IU]/mL (ref 0.350–4.500)

## 2017-08-18 LAB — I-STAT TROPONIN, ED: Troponin i, poc: 0.02 ng/mL (ref 0.00–0.08)

## 2017-08-18 LAB — MRSA PCR SCREENING: MRSA BY PCR: NEGATIVE

## 2017-08-18 MED ORDER — LIDOCAINE-EPINEPHRINE (PF) 2 %-1:200000 IJ SOLN
INTRAMUSCULAR | Status: AC
  Administered 2017-08-18: 20 mL
  Filled 2017-08-18: qty 20

## 2017-08-18 MED ORDER — LIDOCAINE-EPINEPHRINE (PF) 2 %-1:200000 IJ SOLN
20.0000 mL | Freq: Once | INTRAMUSCULAR | Status: AC
  Administered 2017-08-18: 20 mL

## 2017-08-18 MED ORDER — TETANUS-DIPHTH-ACELL PERTUSSIS 5-2.5-18.5 LF-MCG/0.5 IM SUSP
0.5000 mL | Freq: Once | INTRAMUSCULAR | Status: AC
  Administered 2017-08-18: 0.5 mL via INTRAMUSCULAR
  Filled 2017-08-18: qty 0.5

## 2017-08-18 MED ORDER — ASPIRIN EC 325 MG PO TBEC
325.0000 mg | DELAYED_RELEASE_TABLET | Freq: Every morning | ORAL | Status: DC
Start: 2017-08-18 — End: 2017-08-21
  Administered 2017-08-18 – 2017-08-21 (×4): 325 mg via ORAL
  Filled 2017-08-18 (×5): qty 1

## 2017-08-18 MED ORDER — DOXAZOSIN MESYLATE 8 MG PO TABS
4.0000 mg | ORAL_TABLET | Freq: Every day | ORAL | Status: DC
  Administered 2017-08-18 – 2017-08-20 (×3): 4 mg via ORAL
  Filled 2017-08-18 (×3): qty 1

## 2017-08-18 MED ORDER — SIMVASTATIN 20 MG PO TABS
20.0000 mg | ORAL_TABLET | Freq: Every day | ORAL | Status: DC
  Administered 2017-08-18 – 2017-08-20 (×3): 20 mg via ORAL
  Filled 2017-08-18 (×3): qty 1

## 2017-08-18 MED ORDER — DONEPEZIL HCL 10 MG PO TABS
10.0000 mg | ORAL_TABLET | Freq: Every day | ORAL | Status: DC
  Administered 2017-08-18 – 2017-08-20 (×3): 10 mg via ORAL
  Filled 2017-08-18 (×3): qty 1

## 2017-08-18 MED ORDER — HYDROCHLOROTHIAZIDE 25 MG PO TABS
12.5000 mg | ORAL_TABLET | Freq: Every day | ORAL | Status: DC
  Administered 2017-08-18 – 2017-08-21 (×4): 12.5 mg via ORAL
  Filled 2017-08-18 (×5): qty 1

## 2017-08-18 NOTE — Progress Notes (Signed)
Progress Note  Patient Name: Joshua Burns Date of Encounter: 08/18/2017  Primary Cardiologist: Eulas Post, MD  Subjective   He denies any chest pain, SOB , palpitations or presyncope.  He actually denies that he passed out.  He says that he simply fell.  There is a mention of 6 second pauses on the monitor in the ED but I don't see these.    Inpatient Medications    Scheduled Meds: . aspirin EC  325 mg Oral q morning - 10a  . donepezil  10 mg Oral QHS  . doxazosin  4 mg Oral QHS  . hydrochlorothiazide  12.5 mg Oral Daily  . simvastatin  20 mg Oral QHS   Continuous Infusions:  PRN Meds:    Vital Signs    Vitals:   08/18/17 0600 08/18/17 0630 08/18/17 0700 08/18/17 0811  BP: (!) 165/84 (!) 147/63 (!) 158/62 (!) 163/66  Pulse: 67 (!) 57 (!) 52 67  Resp: 18 18 13 14   Temp:   99 F (37.2 C)   TempSrc:   Oral   SpO2: 99% 99% 99% 98%  Weight:      Height:        Intake/Output Summary (Last 24 hours) at 08/18/17 1031 Last data filed at 08/18/17 0500  Gross per 24 hour  Intake                0 ml  Output              275 ml  Net             -275 ml   Filed Weights   08/18/17 0430  Weight: 138 lb 10.7 oz (62.9 kg)    Telemetry    NSR, blocked PACs.   - Personally Reviewed  ECG    NA - Personally Reviewed  Physical Exam   GEN: No acute distress.   Neck: No  JVD Cardiac: RRR, no murmurs, rubs, or gallops.  Respiratory: Clear  to auscultation bilaterally. GI: Soft, nontender, non-distended  MS: No edema; No deformity. Neuro:  Nonfocal  Psych: Normal affect   Labs    Chemistry Recent Labs Lab 08/18/17 0348  NA 141  K 3.5  CL 100*  GLUCOSE 99  BUN 9  CREATININE 1.20     Hematology Recent Labs Lab 08/18/17 0330 08/18/17 0348  WBC 5.1  --   RBC 3.25*  --   HGB 9.3* 9.2*  HCT 28.4* 27.0*  MCV 87.4  --   MCH 28.6  --   MCHC 32.7  --   RDW 15.5  --   PLT 55*  --     Cardiac EnzymesNo results for input(s): TROPONINI in the  last 168 hours.  Recent Labs Lab 08/18/17 0347  TROPIPOC 0.02     BNPNo results for input(s): BNP, PROBNP in the last 168 hours.   DDimer No results for input(s): DDIMER in the last 168 hours.   Radiology    Ct Head Wo Contrast  Result Date: 08/18/2017 CLINICAL DATA:  Fall, does not remember events surrounding the fall. EXAM: CT HEAD WITHOUT CONTRAST CT CERVICAL SPINE WITHOUT CONTRAST TECHNIQUE: Multidetector CT imaging of the head and cervical spine was performed following the standard protocol without intravenous contrast. Multiplanar CT image reconstructions of the cervical spine were also generated. COMPARISON:  Head CT 09/16/2012 FINDINGS: CT HEAD FINDINGS Brain: Age related atrophy. Mild chronic small vessel ischemia. No intracranial hemorrhage, mass effect, or midline shift. No  hydrocephalus. The basilar cisterns are patent. No evidence of territorial infarct or acute ischemia. No extra-axial or intracranial fluid collection. Vascular: Atherosclerosis of skullbase vasculature without hyperdense vessel or abnormal calcification. Skull: No skull fracture or focal lesion. Sinuses/Orbits: Paranasal sinuses and mastoid air cells are clear. The visualized orbits are unremarkable. Bilateral cataract resection. Other: None. CT CERVICAL SPINE FINDINGS Alignment: Degenerative-type anterolisthesis of C7 on T1. Anterior fusion C4 through C7 with plate and screw fixation. No traumatic subluxation. Skull base and vertebrae: The dens and skull base are intact. Anterior C4 through C7 fusion with intact hardware. Solid bony fusion C4 through C5, incomplete bony fusion C6-C7. No acute fracture. Soft tissues and spinal canal: No prevertebral fluid or swelling. No visible canal hematoma. Disc levels: Anterior C4 through C7 fusion. Adjacent level degenerative disc disease and facet arthropathy. Upper chest: No acute abnormality.  Suspect mild emphysema. Other: Carotid atherosclerosis. IMPRESSION: 1.  No acute  intracranial abnormality.  No skull fracture. 2. Degenerative and postsurgical change in the cervical spine without acute fracture or subluxation. Electronically Signed   By: Jeb Levering M.D.   On: 08/18/2017 03:22   Ct Cervical Spine Wo Contrast  Result Date: 08/18/2017 CLINICAL DATA:  Fall, does not remember events surrounding the fall. EXAM: CT HEAD WITHOUT CONTRAST CT CERVICAL SPINE WITHOUT CONTRAST TECHNIQUE: Multidetector CT imaging of the head and cervical spine was performed following the standard protocol without intravenous contrast. Multiplanar CT image reconstructions of the cervical spine were also generated. COMPARISON:  Head CT 09/16/2012 FINDINGS: CT HEAD FINDINGS Brain: Age related atrophy. Mild chronic small vessel ischemia. No intracranial hemorrhage, mass effect, or midline shift. No hydrocephalus. The basilar cisterns are patent. No evidence of territorial infarct or acute ischemia. No extra-axial or intracranial fluid collection. Vascular: Atherosclerosis of skullbase vasculature without hyperdense vessel or abnormal calcification. Skull: No skull fracture or focal lesion. Sinuses/Orbits: Paranasal sinuses and mastoid air cells are clear. The visualized orbits are unremarkable. Bilateral cataract resection. Other: None. CT CERVICAL SPINE FINDINGS Alignment: Degenerative-type anterolisthesis of C7 on T1. Anterior fusion C4 through C7 with plate and screw fixation. No traumatic subluxation. Skull base and vertebrae: The dens and skull base are intact. Anterior C4 through C7 fusion with intact hardware. Solid bony fusion C4 through C5, incomplete bony fusion C6-C7. No acute fracture. Soft tissues and spinal canal: No prevertebral fluid or swelling. No visible canal hematoma. Disc levels: Anterior C4 through C7 fusion. Adjacent level degenerative disc disease and facet arthropathy. Upper chest: No acute abnormality.  Suspect mild emphysema. Other: Carotid atherosclerosis. IMPRESSION: 1.   No acute intracranial abnormality.  No skull fracture. 2. Degenerative and postsurgical change in the cervical spine without acute fracture or subluxation. Electronically Signed   By: Jeb Levering M.D.   On: 08/18/2017 03:22   Dg Chest Portable 1 View  Result Date: 08/18/2017 CLINICAL DATA:  Fall. EXAM: PORTABLE CHEST 1 VIEW COMPARISON:  09/02/2015 FINDINGS: The heart is enlarged. Atherosclerosis of the aortic arch. No pulmonary edema. No focal airspace opacity. No pneumothorax, multiple skin folds project over the left chest. No large pleural effusion. No evidence of acute osseous abnormality. IMPRESSION: Cardiomegaly with thoracic aortic atherosclerosis. No acute or evidence of traumatic abnormality. Electronically Signed   By: Jeb Levering M.D.   On: 08/18/2017 02:58    Cardiac Studies   NA  Patient Profile     83 y.o. male with history of HTN and mild cognitive impairment who presented to the ED after a syncopal episode, who  is admitted to cardiology for sinus pauses.  Assessment & Plan    SYNCOPE:  As with many syncope admissions the story is not straight forward.  He was alone with it happened.  Denies LOC.  Observe on tele.  Possible pacemaker pending the tele findings.    HTN:  I don't see orthostatics.  Check these and then probable titration of his meds for HTN.    Signed, Minus Breeding, MD  08/18/2017, 10:31 AM

## 2017-08-18 NOTE — ED Provider Notes (Signed)
[facility] DEPT Provider Note   CSN: [id number] Arrival date & time: 08/17/17  2305     History   Chief Complaint Chief Complaint  Patient presents with  . Fall  . Head Injury    HPI Joshua Burns is a 83 y.o. male.  The history is provided by the patient. The history is limited by the condition of the patient.  Fall  This is a new problem. The current episode started 6 to 12 hours ago. The problem occurs constantly. The problem has been resolved. Pertinent negatives include no abdominal pain and no shortness of breath. Nothing aggravates the symptoms. Nothing relieves the symptoms. He has tried nothing for the symptoms. The treatment provided moderate relief.  Head Injury   The incident occurred 6 to 12 hours ago. He came to the ER via walk-in. Length of episode of loss of consciousness: unknown. The volume of blood lost was minimal. The quality of the pain is described as dull. The pain is moderate. The pain has been constant since the injury. Pertinent negatives include no numbness. Found by EMS: drove self to the ED. Treatment prior to arrival: none. He has tried nothing for the symptoms. The treatment provided moderate relief.  Found to be brady in triage with sinus pauses.  He does not remember what happened.  Denies f/c/r.    Past Medical History:  Diagnosis Date  . Arthritis   . CAD (coronary artery disease)   . Glaucoma   . HYPERLIPIDEMIA 01/28/2010  . HYPERTENSION 01/28/2010  . HYPERTROPHY PROSTATE W/UR OBST & OTH LUTS 01/28/2010    Patient Active Problem List   Diagnosis Date Noted  . Sinus pause 08/18/2017  . Cognitive impairment 12/05/2016  . 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 Glaucoma 06/13/2012  . Hyperlipidemia 01/28/2010  . Essential hypertension 01/28/2010  . BPH (benign prostatic hyperplasia) 01/28/2010  . CAD (coronary artery disease) 08/22/2003    Past Surgical History:  Procedure Laterality Date  . ANGIOPLASTY  2004  .  CARDIOVASCULAR STRESS TEST  08-04-2008   EF 0%  . [place] SURGERY  2004  . HERNIA REPAIR  2006  . PROSTATE BIOPSY  03/2008  . PROSTATE SURGERY  2009   bx  . US ECHOCARDIOGRAPHY  08-04-2008   Est EF 55-60%       Home Medications    Prior to Admission medications   Medication Sig Start Date End Date Taking? Authorizing Provider  aspirin EC 325 MG tablet Take 325 mg by mouth every morning.   Yes [provider]  donepezil (ARICEPT) 10 MG tablet Take 1 tablet (10 mg total) by mouth at bedtime. 12/05/16  Yes Burchette, Alinda Sierras, MD  doxazosin (CARDURA) 4 MG tablet TAKE 1 TABLET BY MOUTH AT BEDTIME 01/24/17  Yes Burchette, Alinda Sierras, MD  hydrochlorothiazide (HYDRODIURIL) 12.5 MG tablet TAKE 1 TABLET EVERY DAY 01/24/17  Yes Burchette, Alinda Sierras, MD  simvastatin (ZOCOR) 20 MG tablet TAKE 1 TABLET BY MOUTH EVERY DAY AT BEDTIME 05/07/17  Yes Burchette, Alinda Sierras, MD    Family History Family History  Problem Relation Age of Onset  . Heart disease Mother   . Heart disease Father   . Aneurysm Brother     Social History Social History  Substance Use Topics  . Smoking status: Former Smoker    Quit date: 12/25/1978  . Smokeless tobacco: Never Used  . Alcohol use No     Allergies   Ace inhibitors and Diltiazem   Review of Systems  Review of Systems  Constitutional: Negative for fever.  HENT: Negative for congestion.   Eyes: Negative for photophobia.  Respiratory: Negative for cough and shortness of breath.   Cardiovascular: Negative for leg swelling.  Gastrointestinal: Negative for abdominal pain and constipation.  Musculoskeletal: Negative for neck pain.  Skin: Positive for wound.  Neurological: Negative for speech difficulty and numbness.  All other systems reviewed and are negative.    Physical Exam Updated Vital Signs BP (!) 190/72   Pulse (!) 117   Temp 98.4 F (36.9 C) (Oral)   Resp 17   SpO2 95%   Physical Exam  Constitutional: He appears well-developed  and well-nourished.  HENT:  Head: Normocephalic.    Right Ear: External ear normal.  Left Ear: External ear normal.  Nose: Nose normal.  Mouth/Throat: Oropharynx is clear and moist. No oropharyngeal exudate.  Eyes: Pupils are equal, round, and reactive to light. Conjunctivae are normal.  Neck: Normal range of motion. Neck supple. No JVD present.  Cardiovascular: Regular rhythm and intact distal pulses.  Bradycardia present.   Pulmonary/Chest: Effort normal and breath sounds normal. No stridor. No respiratory distress. He has no wheezes. He has no rales.  Abdominal: Soft. Bowel sounds are normal. He exhibits no mass. There is no tenderness. There is no rebound and no guarding.  Musculoskeletal: Normal range of motion. He exhibits no edema, tenderness or deformity.  Neurological: He is alert. He displays normal reflexes.  Skin: Skin is warm and dry. Capillary refill takes less than 2 seconds.     ED Treatments / Results   Vitals:   08/18/17 0345 08/18/17 0400  BP: (!) 159/97 (!) 190/72  Pulse: (!) 118 (!) 117  Resp: 16 17  Temp:    SpO2: 100% 95%    Labs (all labs ordered are listed, but only abnormal results are displayed) Results for orders placed or performed during the hospital encounter of 08/18/17  I-Stat Chem 8, ED  Result Value Ref Range   Sodium 141 135 - 145 mmol/L   Potassium 3.5 3.5 - 5.1 mmol/L   Chloride 100 (L) 101 - 111 mmol/L   BUN 9 6 - 20 mg/dL   Creatinine, Ser 1.20 0.61 - 1.24 mg/dL   Glucose, Bld 99 65 - 99 mg/dL   Calcium, Ion 1.16 1.15 - 1.40 mmol/L   TCO2 29 22 - 32 mmol/L   Hemoglobin 9.2 (L) 13.0 - 17.0 g/dL   HCT 27.0 (L) 39.0 - 52.0 %  I-stat troponin, ED  Result Value Ref Range   Troponin i, poc 0.02 0.00 - 0.08 ng/mL   Comment 3           Ct Head Wo Contrast  Result Date: 08/18/2017 CLINICAL DATA:  Fall, does not remember events surrounding the fall. EXAM: CT HEAD WITHOUT CONTRAST CT CERVICAL SPINE WITHOUT CONTRAST TECHNIQUE:  Multidetector CT imaging of the head and cervical spine was performed following the standard protocol without intravenous contrast. Multiplanar CT image reconstructions of the cervical spine were also generated. COMPARISON:  Head CT 09/16/2012 FINDINGS: CT HEAD FINDINGS Brain: Age related atrophy. Mild chronic small vessel ischemia. No intracranial hemorrhage, mass effect, or midline shift. No hydrocephalus. The basilar cisterns are patent. No evidence of territorial infarct or acute ischemia. No extra-axial or intracranial fluid collection. Vascular: Atherosclerosis of skullbase vasculature without hyperdense vessel or abnormal calcification. Skull: No skull fracture or focal lesion. Sinuses/Orbits: Paranasal sinuses and mastoid air cells are clear. The visualized orbits are unremarkable. Bilateral cataract  resection. Other: None. CT CERVICAL SPINE FINDINGS Alignment: Degenerative-type anterolisthesis of C7 on T1. Anterior fusion C4 through C7 with plate and screw fixation. No traumatic subluxation. Skull base and vertebrae: The dens and skull base are intact. Anterior C4 through C7 fusion with intact hardware. Solid bony fusion C4 through C5, incomplete bony fusion C6-C7. No acute fracture. Soft tissues and spinal canal: No prevertebral fluid or swelling. No visible canal hematoma. Disc levels: Anterior C4 through C7 fusion. Adjacent level degenerative disc disease and facet arthropathy. Upper chest: No acute abnormality.  Suspect mild emphysema. Other: Carotid atherosclerosis. IMPRESSION: 1.  No acute intracranial abnormality.  No skull fracture. 2. Degenerative and postsurgical change in the cervical spine without acute fracture or subluxation. Electronically Signed   By: Jeb Levering M.D.   On: 08/18/2017 03:22   Ct Cervical Spine Wo Contrast  Result Date: 08/18/2017 CLINICAL DATA:  Fall, does not remember events surrounding the fall. EXAM: CT HEAD WITHOUT CONTRAST CT CERVICAL SPINE WITHOUT CONTRAST  TECHNIQUE: Multidetector CT imaging of the head and cervical spine was performed following the standard protocol without intravenous contrast. Multiplanar CT image reconstructions of the cervical spine were also generated. COMPARISON:  Head CT 09/16/2012 FINDINGS: CT HEAD FINDINGS Brain: Age related atrophy. Mild chronic small vessel ischemia. No intracranial hemorrhage, mass effect, or midline shift. No hydrocephalus. The basilar cisterns are patent. No evidence of territorial infarct or acute ischemia. No extra-axial or intracranial fluid collection. Vascular: Atherosclerosis of skullbase vasculature without hyperdense vessel or abnormal calcification. Skull: No skull fracture or focal lesion. Sinuses/Orbits: Paranasal sinuses and mastoid air cells are clear. The visualized orbits are unremarkable. Bilateral cataract resection. Other: None. CT CERVICAL SPINE FINDINGS Alignment: Degenerative-type anterolisthesis of C7 on T1. Anterior fusion C4 through C7 with plate and screw fixation. No traumatic subluxation. Skull base and vertebrae: The dens and skull base are intact. Anterior C4 through C7 fusion with intact hardware. Solid bony fusion C4 through C5, incomplete bony fusion C6-C7. No acute fracture. Soft tissues and spinal canal: No prevertebral fluid or swelling. No visible canal hematoma. Disc levels: Anterior C4 through C7 fusion. Adjacent level degenerative disc disease and facet arthropathy. Upper chest: No acute abnormality.  Suspect mild emphysema. Other: Carotid atherosclerosis. IMPRESSION: 1.  No acute intracranial abnormality.  No skull fracture. 2. Degenerative and postsurgical change in the cervical spine without acute fracture or subluxation. Electronically Signed   By: Jeb Levering M.D.   On: 08/18/2017 03:22   Dg Chest Portable 1 View  Result Date: 08/18/2017 CLINICAL DATA:  Fall. EXAM: PORTABLE CHEST 1 VIEW COMPARISON:  09/02/2015 FINDINGS: The heart is enlarged. Atherosclerosis of the  aortic arch. No pulmonary edema. No focal airspace opacity. No pneumothorax, multiple skin folds project over the left chest. No large pleural effusion. No evidence of acute osseous abnormality. IMPRESSION: Cardiomegaly with thoracic aortic atherosclerosis. No acute or evidence of traumatic abnormality. Electronically Signed   By: Jeb Levering M.D.   On: 08/18/2017 02:58    EKG  EKG Interpretation  Date/Time:  Saturday August 18 2017 03:08:07 EDT Ventricular Rate:  61 PR Interval:    QRS Duration: 81 QT Interval:  442 QTC Calculation: 446 R Axis:   47 Text Interpretation:  Sinus rhythm Confirmed by Dory Horn) on 08/18/2017 3:41:36 AM       Radiology Ct Head Wo Contrast  Result Date: 08/18/2017 CLINICAL DATA:  Fall, does not remember events surrounding the fall. EXAM: CT HEAD WITHOUT CONTRAST CT CERVICAL SPINE WITHOUT CONTRAST  TECHNIQUE: Multidetector CT imaging of the head and cervical spine was performed following the standard protocol without intravenous contrast. Multiplanar CT image reconstructions of the cervical spine were also generated. COMPARISON:  Head CT 09/16/2012 FINDINGS: CT HEAD FINDINGS Brain: Age related atrophy. Mild chronic small vessel ischemia. No intracranial hemorrhage, mass effect, or midline shift. No hydrocephalus. The basilar cisterns are patent. No evidence of territorial infarct or acute ischemia. No extra-axial or intracranial fluid collection. Vascular: Atherosclerosis of skullbase vasculature without hyperdense vessel or abnormal calcification. Skull: No skull fracture or focal lesion. Sinuses/Orbits: Paranasal sinuses and mastoid air cells are clear. The visualized orbits are unremarkable. Bilateral cataract resection. Other: None. CT CERVICAL SPINE FINDINGS Alignment: Degenerative-type anterolisthesis of C7 on T1. Anterior fusion C4 through C7 with plate and screw fixation. No traumatic subluxation. Skull base and vertebrae: The dens and skull  base are intact. Anterior C4 through C7 fusion with intact hardware. Solid bony fusion C4 through C5, incomplete bony fusion C6-C7. No acute fracture. Soft tissues and spinal canal: No prevertebral fluid or swelling. No visible canal hematoma. Disc levels: Anterior C4 through C7 fusion. Adjacent level degenerative disc disease and facet arthropathy. Upper chest: No acute abnormality.  Suspect mild emphysema. Other: Carotid atherosclerosis. IMPRESSION: 1.  No acute intracranial abnormality.  No skull fracture. 2. Degenerative and postsurgical change in the cervical spine without acute fracture or subluxation. Electronically Signed   By: Jeb Levering M.D.   On: 08/18/2017 03:22   Ct Cervical Spine Wo Contrast  Result Date: 08/18/2017 CLINICAL DATA:  Fall, does not remember events surrounding the fall. EXAM: CT HEAD WITHOUT CONTRAST CT CERVICAL SPINE WITHOUT CONTRAST TECHNIQUE: Multidetector CT imaging of the head and cervical spine was performed following the standard protocol without intravenous contrast. Multiplanar CT image reconstructions of the cervical spine were also generated. COMPARISON:  Head CT 09/16/2012 FINDINGS: CT HEAD FINDINGS Brain: Age related atrophy. Mild chronic small vessel ischemia. No intracranial hemorrhage, mass effect, or midline shift. No hydrocephalus. The basilar cisterns are patent. No evidence of territorial infarct or acute ischemia. No extra-axial or intracranial fluid collection. Vascular: Atherosclerosis of skullbase vasculature without hyperdense vessel or abnormal calcification. Skull: No skull fracture or focal lesion. Sinuses/Orbits: Paranasal sinuses and mastoid air cells are clear. The visualized orbits are unremarkable. Bilateral cataract resection. Other: None. CT CERVICAL SPINE FINDINGS Alignment: Degenerative-type anterolisthesis of C7 on T1. Anterior fusion C4 through C7 with plate and screw fixation. No traumatic subluxation. Skull base and vertebrae: The dens  and skull base are intact. Anterior C4 through C7 fusion with intact hardware. Solid bony fusion C4 through C5, incomplete bony fusion C6-C7. No acute fracture. Soft tissues and spinal canal: No prevertebral fluid or swelling. No visible canal hematoma. Disc levels: Anterior C4 through C7 fusion. Adjacent level degenerative disc disease and facet arthropathy. Upper chest: No acute abnormality.  Suspect mild emphysema. Other: Carotid atherosclerosis. IMPRESSION: 1.  No acute intracranial abnormality.  No skull fracture. 2. Degenerative and postsurgical change in the cervical spine without acute fracture or subluxation. Electronically Signed   By: Jeb Levering M.D.   On: 08/18/2017 03:22   Dg Chest Portable 1 View  Result Date: 08/18/2017 CLINICAL DATA:  Fall. EXAM: PORTABLE CHEST 1 VIEW COMPARISON:  09/02/2015 FINDINGS: The heart is enlarged. Atherosclerosis of the aortic arch. No pulmonary edema. No focal airspace opacity. No pneumothorax, multiple skin folds project over the left chest. No large pleural effusion. No evidence of acute osseous abnormality. IMPRESSION: Cardiomegaly with thoracic aortic atherosclerosis.  No acute or evidence of traumatic abnormality. Electronically Signed   By: Jeb Levering M.D.   On: 08/18/2017 02:58    Procedures .Marland KitchenLaceration Repair Date/Time: 08/18/2017 4:21 AM Performed by: Veatrice Kells Authorized by: Veatrice Kells   Consent:    Consent obtained:  Verbal   Consent given by:  Patient   Risks discussed:  Infection and need for additional repair   Alternatives discussed:  No treatment Anesthesia (see MAR for exact dosages):    Anesthesia method:  Local infiltration   Local anesthetic:  Lidocaine 1% WITH epi Laceration details:    Location: left eyebrow.   Length (cm):  4.5   Depth (mm):  2 Repair type:    Repair type:  Simple Pre-procedure details:    Preparation:  Patient was prepped and draped in usual sterile fashion Exploration:    Hemostasis  achieved with:  Direct pressure   Wound exploration: wound explored through full range of motion     Wound extent: no areolar tissue violation noted and no nerve damage noted     Contaminated: no   Treatment:    Area cleansed with:  Betadine   Amount of cleaning:  Extensive   Visualized foreign bodies/material removed: no   Skin repair:    Repair method:  Sutures   Suture size:  5-0   Suture material:  Prolene   Suture technique:  Simple interrupted   Number of sutures:  7 Approximation:    Approximation:  Close   Vermilion border: well-aligned   Post-procedure details:    Dressing:  Bulky dressing   Patient tolerance of procedure:  Tolerated well, no immediate complications   (including critical care time)  Medications Ordered in ED Medications  lidocaine-EPINEPHrine (XYLOCAINE W/EPI) 2 %-1:200000 (PF) injection 20 mL (not administered)  Tdap (BOOSTRIX) injection 0.5 mL (not administered)  lidocaine-EPINEPHrine (XYLOCAINE W/EPI) 2 %-1:200000 (PF) injection (not administered)      Case d/w cardiology fellow will admit to the ICU at [facility]   Final Clinical Impressions(s) / ED Diagnoses   Final diagnoses:  Fall, initial encounter  Syncope, unspecified syncope type  Sinus pause   Patient to be transferred to [facility] for admission    London Tarnowski, MD 08/18/17 0932

## 2017-08-18 NOTE — H&P (Signed)
Patient ID: Joshua Burns MRN: [id number], DOB/AGE: Oct 21, 1925  Admit date: 08/18/2017 Primary Physician: Eulas Post, MD Primary Cardiologist: N/A  CARDIOLOGY ADMISSION History & Physical   HISTORY OF PRESENT ILLNESS   Joshua Burns is a very pleasant 83 year old gentleman with history of HTN and mild cognitive impairment who presented to the ED after a syncopal episode, who is admitted to cardiology for sinus pauses.  The patient is quite healthy at baseline. He lives with his son. He performs most ADLs independently. He exercises ("floor exercises") every day and ambulates without assistive device. He has no history of syncope, presyncope, or arrythmias. No recent chest pain or dyspnea. Tonight he was walking in his home when he had an unwitnessed fall secondary to syncope. When he woke up he stayed at home for a couple hours because he was "waiting on something" (cannot remember what) before driving himself to the [facility] ED. There he was found to have a facial laceration that was addressed by Dr. Randal Buba, and his telemetry showed occasional bradycardia with sinus pauses reportedly up to 5-6 seconds.  After arrive to [facility], he is hemodynamically stable and comfortable without acute complaints. His telemetry shows sinus rhythm in the 60-70s with occasional PVCs, although during our interview he did have an episode of sinus bradycardia to the 40-50s that was not accompanied by symptoms.  Review of Systems All other systems reviewed and are otherwise negative except as noted above.   MEDICAL, FAMILY, AND SOCIAL HISTORY   Past Medical History:  Diagnosis Date  . Arthritis   . CAD (coronary artery disease)   . Glaucoma   . HYPERLIPIDEMIA 01/28/2010  . HYPERTENSION 01/28/2010  . HYPERTROPHY PROSTATE W/UR OBST & OTH LUTS 01/28/2010    Past Surgical History:  Procedure Laterality Date  . ANGIOPLASTY  2004  . CARDIOVASCULAR STRESS TEST  08-04-2008   EF 0%  . [place] SURGERY  2004  .  HERNIA REPAIR  2006  . PROSTATE BIOPSY  03/2008  . PROSTATE SURGERY  2009   bx  . US ECHOCARDIOGRAPHY  08-04-2008   Est EF 55-60%    Allergies  Allergen Reactions  . Ace Inhibitors Swelling  . Diltiazem Swelling and Palpitations   Prior to Admission medications   Medication Sig Start Date End Date Taking? Authorizing Provider  aspirin EC 325 MG tablet Take 325 mg by mouth every morning.   Yes [provider]  donepezil (ARICEPT) 10 MG tablet Take 1 tablet (10 mg total) by mouth at bedtime. 12/05/16  Yes Burchette, Alinda Sierras, MD  doxazosin (CARDURA) 4 MG tablet TAKE 1 TABLET BY MOUTH AT BEDTIME 01/24/17  Yes Burchette, Alinda Sierras, MD  hydrochlorothiazide (HYDRODIURIL) 12.5 MG tablet TAKE 1 TABLET EVERY DAY 01/24/17  Yes Burchette, Alinda Sierras, MD  simvastatin (ZOCOR) 20 MG tablet TAKE 1 TABLET BY MOUTH EVERY DAY AT BEDTIME 05/07/17  Yes Burchette, Alinda Sierras, MD   Family History  Problem Relation Age of Onset  . Heart disease Mother   . Heart disease Father   . Aneurysm Brother    Social History   Social History  . Marital status: Married    Spouse name: N/A  . Number of children: 3  . Years of education: N/A   Occupational History  .  [employer]   Social History Main Topics  . Smoking status: Former Smoker    Quit date: 12/25/1978  . Smokeless tobacco: Never Used  . Alcohol use No  .  Drug use: No  . Sexual activity: Not on file   Other Topics Concern  . Not on file   Social History Narrative  . No narrative on file      PHYSICAL EXAM  Blood pressure (!) 165/84, pulse 67, temperature 98.4 F (36.9 C), temperature source Oral, resp. rate 18, height 5\' 6"  (1.676 m), weight 62.9 kg (138 lb 10.7 oz), SpO2 99 %.  General: Pleasant, NAD Psych: Normal affect. Neuro: Alert and oriented X 3. Moves all extremities spontaneously. HEENT: Sclera anicteric, noninjected. MMM.  Neck: JVP is not elevated. Lungs:  CTABL, normal WOB Heart: RRR no m/r/g Abdomen: Soft, non-tender,  non-distended, BS + x 4.  Extremities: No clubbing or cyanosis. Edema: trace BL pitting. DP/PT/Radials 2+ and equal bilaterally.   LABS and STUDIES  Troponin (Point of Care Test)  Recent Labs  08/18/17 0347  TROPIPOC 0.02   No results for input(s): CKTOTAL, CKMB, TROPONINI in the last 72 hours. Lab Results  Component Value Date   WBC 5.1 08/18/2017   HGB 9.2 (L) 08/18/2017   HCT 27.0 (L) 08/18/2017   MCV 87.4 08/18/2017   PLT 55 (L) 08/18/2017    Recent Labs Lab 08/18/17 0348  NA 141  K 3.5  CL 100*  BUN 9  CREATININE 1.20  GLUCOSE 99   Lab Results  Component Value Date   CHOL 118 07/17/2016   HDL 34.20 (L) 07/17/2016   LDLCALC 69 07/17/2016   TRIG 70.0 07/17/2016   No results found for: DDIMER   Radiology/Studies. CT head/neck shows no acute intracranial abnormality, no acute fracture or subluxation of cervical spine  CXR shows clear lung fields  ECG was independently reviewed. Sinus bradycardia with occasional PVCs. No acute ischemic changes   ASSESSMENT and PLAN   Joshua Burns is a pleasant 83 year old gentleman who presents with syncope, concerning for cardiac syncope 2/2 sinus node dysfunction.  Sinus bradycardia with reported episodes of sinus pauses - Asymptomatic currently, but rhythm may be responsible for syncope - Monitor on telemetry - TSH within normal limits - No angina or ECG changes to suggest ischemic etiology - No medications that are obvious culprits. Doxazosin can cause bradycardia in < 1% of patients - Echocardiogram pending - I discussed with the patient that PPM may be recommended pending workup  Syncope and head trauma - Laceration over L eye s/p repair in ED - CT head negative for intracranial hemorrhage or acute cervical spine injury  Hypertension. Mildly hypertensive on admission - Continue HCTZ and Doxazosin  Hyperlipidemia. Continue Simvastatin. Lipids pending.  IVF: None Diet: NPO DVT PPX: SCDs given recent head  trauma and possible upcoming procedure Dispo: Admit to Cardiology, ICU status  PAULEY, ERIC D, MD 08/18/2017, 6:09 AM

## 2017-08-18 NOTE — ED Notes (Signed)
Pt given urinal for sample 

## 2017-08-18 NOTE — ED Triage Notes (Signed)
Pt drove self to hospital states he had a fall but doesn't remember the events surrounding the fall. Is alert to person and place pupils are PERRLA

## 2017-08-18 NOTE — ED Notes (Signed)
Report given to [facility] 

## 2017-08-18 NOTE — ED Notes (Signed)
Patient to CT.

## 2017-08-18 NOTE — ED Notes (Signed)
[facility] called for transport. 

## 2017-08-19 ENCOUNTER — Other Ambulatory Visit: Payer: Self-pay | Admitting: Oncology

## 2017-08-19 DIAGNOSIS — D649 Anemia, unspecified: Secondary | ICD-10-CM

## 2017-08-19 DIAGNOSIS — D696 Thrombocytopenia, unspecified: Secondary | ICD-10-CM

## 2017-08-19 LAB — BASIC METABOLIC PANEL
ANION GAP: 8 (ref 5–15)
BUN: 10 mg/dL (ref 6–20)
CALCIUM: 9 mg/dL (ref 8.9–10.3)
CO2: 29 mmol/L (ref 22–32)
Chloride: 102 mmol/L (ref 101–111)
Creatinine, Ser: 1.18 mg/dL (ref 0.61–1.24)
GFR, EST NON AFRICAN AMERICAN: 52 mL/min — AB (ref >60)
GLUCOSE: 90 mg/dL (ref 65–99)
Potassium: 3.4 mmol/L — ABNORMAL LOW (ref 3.5–5.1)
SODIUM: 139 mmol/L (ref 135–145)

## 2017-08-19 LAB — CBC
HCT: 25.7 % — ABNORMAL LOW (ref 39.0–52.0)
HEMOGLOBIN: 8.2 g/dL — AB (ref 13.0–17.0)
MCH: 27.9 pg (ref 26.0–34.0)
MCHC: 31.9 g/dL (ref 30.0–36.0)
MCV: 87.4 fL (ref 78.0–100.0)
Platelets: 49 10*3/uL — ABNORMAL LOW (ref 150–400)
RBC: 2.94 MIL/uL — ABNORMAL LOW (ref 4.22–5.81)
RDW: 15.6 % — ABNORMAL HIGH (ref 11.5–15.5)
WBC: 4.9 10*3/uL (ref 4.0–10.5)

## 2017-08-19 NOTE — Progress Notes (Addendum)
New pt  Got transferred from [facility]. Pt brought to the floor in stable condition. Vitals taken. Initial Assessment done. All immediate pertinent needs to patient addressed. Patient Guide given to patient. Important safety instructions relating to hospitalization reviewed with patient. Patient verbalized understanding. Pt has 9 stitches in his left eye and it is bluish discoloration, family members at bed side and refusing high/low bed, pt is very alert for his age and sitting in a chair now with chair alarm on. Asked to call nurse if needed help anytime   Will continue to monitor pt.

## 2017-08-19 NOTE — Progress Notes (Signed)
Progress Note  Patient Name: Joshua Burns Date of Encounter: 08/19/2017  Primary Cardiologist: Eulas Post, MD  Subjective   He denies any chest pain, neck or arm .  No presyncope or syncope.     Inpatient Medications    Scheduled Meds: . aspirin EC  325 mg Oral q morning - 10a  . donepezil  10 mg Oral QHS  . doxazosin  4 mg Oral QHS  . hydrochlorothiazide  12.5 mg Oral Daily  . simvastatin  20 mg Oral QHS   Continuous Infusions:  PRN Meds:    Vital Signs    Vitals:   08/19/17 0900 08/19/17 1000 08/19/17 1100 08/19/17 1200  BP: (!) 156/84 (!) 149/60 (!) 163/72 (!) 151/69  Pulse: 82 98 68 91  Resp: 19 17 13 14   Temp:   97.8 F (36.6 C)   TempSrc:   Oral   SpO2: 99% 100% 100% 96%  Weight:      Height:        Intake/Output Summary (Last 24 hours) at 08/19/17 1239 Last data filed at 08/19/17 0900  Gross per 24 hour  Intake              480 ml  Output              300 ml  Net              180 ml   Filed Weights   08/18/17 0430 08/19/17 0600  Weight: 138 lb 10.7 oz (62.9 kg) 138 lb 0.1 oz (62.6 kg)    Telemetry    NSR, blocked PACs, short runs of high degree heart block with narrow escape.   - Personally Reviewed  ECG    NA - Personally Reviewed  Physical Exam   GEN: No  acute distress.   Neck: No  JVD Cardiac: RRR, no murmurs, rubs, or gallops.  Respiratory: Clear   to auscultation bilaterally. GI: Soft, nontender, non-distended, normal bowel sounds  MS:  No edema; No deformity. Neuro:   Nonfocal  Psych: Oriented and appropriate   Labs    Chemistry  Recent Labs Lab 08/18/17 0348 08/19/17 0429  NA 141 139  K 3.5 3.4*  CL 100* 102  CO2  --  29  GLUCOSE 99 90  BUN 9 10  CREATININE 1.20 1.18  CALCIUM  --  9.0  GFRNONAA  --  52*  GFRAA  --  >60  ANIONGAP  --  8     Hematology  Recent Labs Lab 08/18/17 0330 08/18/17 0348 08/19/17 0429  WBC 5.1  --  4.9  RBC 3.25*  --  2.94*  HGB 9.3* 9.2* 8.2*  HCT 28.4* 27.0* 25.7*   MCV 87.4  --  87.4  MCH 28.6  --  27.9  MCHC 32.7  --  31.9  RDW 15.5  --  15.6*  PLT 55*  --  49*    Cardiac EnzymesNo results for input(s): TROPONINI in the last 168 hours.   Recent Labs Lab 08/18/17 0347  TROPIPOC 0.02     BNPNo results for input(s): BNP, PROBNP in the last 168 hours.   DDimer No results for input(s): DDIMER in the last 168 hours.   Radiology    Ct Head Wo Contrast  Result Date: 08/18/2017 CLINICAL DATA:  Fall, does not remember events surrounding the fall. EXAM: CT HEAD WITHOUT CONTRAST CT CERVICAL SPINE WITHOUT CONTRAST TECHNIQUE: Multidetector CT imaging of the head and cervical spine was performed  following the standard protocol without intravenous contrast. Multiplanar CT image reconstructions of the cervical spine were also generated. COMPARISON:  Head CT 09/16/2012 FINDINGS: CT HEAD FINDINGS Brain: Age related atrophy. Mild chronic small vessel ischemia. No intracranial hemorrhage, mass effect, or midline shift. No hydrocephalus. The basilar cisterns are patent. No evidence of territorial infarct or acute ischemia. No extra-axial or intracranial fluid collection. Vascular: Atherosclerosis of skullbase vasculature without hyperdense vessel or abnormal calcification. Skull: No skull fracture or focal lesion. Sinuses/Orbits: Paranasal sinuses and mastoid air cells are clear. The visualized orbits are unremarkable. Bilateral cataract resection. Other: None. CT CERVICAL SPINE FINDINGS Alignment: Degenerative-type anterolisthesis of C7 on T1. Anterior fusion C4 through C7 with plate and screw fixation. No traumatic subluxation. Skull base and vertebrae: The dens and skull base are intact. Anterior C4 through C7 fusion with intact hardware. Solid bony fusion C4 through C5, incomplete bony fusion C6-C7. No acute fracture. Soft tissues and spinal canal: No prevertebral fluid or swelling. No visible canal hematoma. Disc levels: Anterior C4 through C7 fusion. Adjacent level  degenerative disc disease and facet arthropathy. Upper chest: No acute abnormality.  Suspect mild emphysema. Other: Carotid atherosclerosis. IMPRESSION: 1.  No acute intracranial abnormality.  No skull fracture. 2. Degenerative and postsurgical change in the cervical spine without acute fracture or subluxation. Electronically Signed   By: Jeb Levering M.D.   On: 08/18/2017 03:22   Ct Cervical Spine Wo Contrast  Result Date: 08/18/2017 CLINICAL DATA:  Fall, does not remember events surrounding the fall. EXAM: CT HEAD WITHOUT CONTRAST CT CERVICAL SPINE WITHOUT CONTRAST TECHNIQUE: Multidetector CT imaging of the head and cervical spine was performed following the standard protocol without intravenous contrast. Multiplanar CT image reconstructions of the cervical spine were also generated. COMPARISON:  Head CT 09/16/2012 FINDINGS: CT HEAD FINDINGS Brain: Age related atrophy. Mild chronic small vessel ischemia. No intracranial hemorrhage, mass effect, or midline shift. No hydrocephalus. The basilar cisterns are patent. No evidence of territorial infarct or acute ischemia. No extra-axial or intracranial fluid collection. Vascular: Atherosclerosis of skullbase vasculature without hyperdense vessel or abnormal calcification. Skull: No skull fracture or focal lesion. Sinuses/Orbits: Paranasal sinuses and mastoid air cells are clear. The visualized orbits are unremarkable. Bilateral cataract resection. Other: None. CT CERVICAL SPINE FINDINGS Alignment: Degenerative-type anterolisthesis of C7 on T1. Anterior fusion C4 through C7 with plate and screw fixation. No traumatic subluxation. Skull base and vertebrae: The dens and skull base are intact. Anterior C4 through C7 fusion with intact hardware. Solid bony fusion C4 through C5, incomplete bony fusion C6-C7. No acute fracture. Soft tissues and spinal canal: No prevertebral fluid or swelling. No visible canal hematoma. Disc levels: Anterior C4 through C7 fusion.  Adjacent level degenerative disc disease and facet arthropathy. Upper chest: No acute abnormality.  Suspect mild emphysema. Other: Carotid atherosclerosis. IMPRESSION: 1.  No acute intracranial abnormality.  No skull fracture. 2. Degenerative and postsurgical change in the cervical spine without acute fracture or subluxation. Electronically Signed   By: Jeb Levering M.D.   On: 08/18/2017 03:22   Dg Chest Portable 1 View  Result Date: 08/18/2017 CLINICAL DATA:  Fall. EXAM: PORTABLE CHEST 1 VIEW COMPARISON:  09/02/2015 FINDINGS: The heart is enlarged. Atherosclerosis of the aortic arch. No pulmonary edema. No focal airspace opacity. No pneumothorax, multiple skin folds project over the left chest. No large pleural effusion. No evidence of acute osseous abnormality. IMPRESSION: Cardiomegaly with thoracic aortic atherosclerosis. No acute or evidence of traumatic abnormality. Electronically Signed   By:  Jeb Levering M.D.   On: 08/18/2017 02:58    Cardiac Studies   NA  Patient Profile     83 y.o. male with history of HTN and mild cognitive impairment who presented to the ED after a syncopal episode, who is admitted to cardiology for sinus pauses.  Assessment & Plan    SYNCOPE:  I cannot be sure of the exact nature of this event.  The only objective finding is head trauma.  I would suspect that he did have frank syncope.  I have reviewed extensively the rhythm strips.  He has blocked PACs in a bigeminal pattern.  He has Mobitz I.  He has, by my review, very brief episodes of higher degree AV block with narrow escape.  I do suspect a bradycardia event as the cause of syncope and head trauma.  I will ask EP to see.  I have discussed this with the patient and his family.    HTN:   BP is labile.  No change in meds.  PANCYTOPENIA:  I don't see any CBCs dating back for several years.  Heme will see in consultation.    Signed, Minus Breeding, MD  08/19/2017, 12:39 PM

## 2017-08-19 NOTE — Progress Notes (Signed)
TELE called several times with pt HR dropping to 35-38  Pt sleeping and asymptomatic  HR will go back up into the 23's  Called MD on call aware  No orders at this time  Bed alarm is on call light in reach

## 2017-08-19 NOTE — Consult Note (Signed)
[place]  [phone] [phone]     ID: Joshua Burns DOB: Apr 28, 1925  MR#: [id number]  JYN#:[id number]  Patient Care Team: Eulas Post, MD as PCP - General [place], Peter M, MD (Cardiology) Chauncey Cruel, MD OTHER MD:  CHIEF COMPLAINT: anemia, thrombocytopenia  CURRENT TREATMENT: under evaluation   HISTORY OF CURRENT ILLNESS: Joshua Burns was admitted 08/18/2017 after a syncopal episode at home. He does not remember any precipitating symptoms such as shortness of breath, chest pain, or palpitations. He has had bilateral cervical spine films and CT of the head without contrast were negative. 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 He is being evaluated for possible pacemaker  Labs on admission showed a white cell count of 5.1 with an unremarkable differential. The absolute lymphocyte count in particular was 1.4. Hemoglobin was 9.3 with an MCV of 87.4. The platelet count was 55,000.  The only prior CBC on record is from 09/16/2012. At that point a white cell count was normal, the hemoglobin was 12.4, MCV 84.9, and the platelet count was at 123,000.  We were consulted for further evaluation of the patient's cytopenias  INTERVAL HISTORY: I met with Joshua Burns in his hospital room. No family was visiting   REVIEW OF SYSTEMS: He tells me he is generally in good health, and in particular has had no unexplained weight loss, and no unexplained fatigue. He denies fevers, rash, pruritus, or drenching sweats. He also denies palpitations, chest pain or pressure, or shortness of breath. He exercises daily at home including doing some situps and pushups. He does not belong to the [employer] or other gym. In general a detailed review of systems today was benign except as noted  PAST MEDICAL HISTORY: Past Medical History:  Diagnosis Date  . Arthritis   . CAD (coronary artery  disease)   . Glaucoma   . HYPERLIPIDEMIA 01/28/2010  . HYPERTENSION 01/28/2010  . HYPERTROPHY PROSTATE W/UR OBST & OTH LUTS 01/28/2010    PAST SURGICAL HISTORY: Past Surgical History:  Procedure Laterality Date  . ANGIOPLASTY  2004  . CARDIOVASCULAR STRESS TEST  08-04-2008   EF 0%  . [place] SURGERY  2004  . HERNIA REPAIR  2006  . PROSTATE BIOPSY  03/2008  . PROSTATE SURGERY  2009   bx  . US ECHOCARDIOGRAPHY  08-04-2008   Est EF 55-60%    FAMILY HISTORY Family History  Problem Relation Age of Onset  . Heart disease Mother   . Heart disease Father   . 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 He was mostly in the [place]. 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 He is widowed. At home he lives with his son Joshua Burns, who is retired as well. Joshua Burns used to work for [employer]. The patient's 2 other children are Joshua Burns, in computers, and Joshua Burns, who is still working for the state of [place]. Both Joshua Burns and Joshua Burns lives in [place]. The patient has 7 grandchildren and at least 43 great grandchildren. He attends the [employer] his [employer] church.    ADVANCED DIRECTIVES: Not in place. During the 08/19/2017 visit health care power of attorney issues were discussed with the patient and he was encouraged to complete advanced directives and get and notarize at his discretion   HEALTH MAINTENANCE: Social History  Substance Use Topics  . Smoking status:  Former Smoker    Quit date: 12/25/1978  . Smokeless tobacco: Never Used  . Alcohol use No     Allergies  Allergen Reactions  . Ace Inhibitors Swelling  . Diltiazem Swelling and Palpitations    Current Facility-Administered Medications  Medication Dose Route Frequency Provider Last Rate Last Dose  . aspirin EC tablet 325 mg  325 mg Oral q morning - 10a Braxton Feathers, MD   325 mg at 08/19/17 1253  . donepezil (ARICEPT) tablet 10 mg  10 mg Oral QHS Braxton Feathers, MD   10  mg at 08/18/17 2139  . doxazosin (CARDURA) tablet 4 mg  4 mg Oral QHS Braxton Feathers, MD   4 mg at 08/18/17 2139  . hydrochlorothiazide (HYDRODIURIL) tablet 12.5 mg  12.5 mg Oral Daily Braxton Feathers, MD   12.5 mg at 08/19/17 1025  . simvastatin (ZOCOR) tablet 20 mg  20 mg Oral QHS Braxton Feathers, MD   20 mg at 08/18/17 2139    OBJECTIVE: Elderly African-American male who appears well  Vitals:   08/19/17 1300 08/19/17 1400  BP: (!) 146/57 (!) 143/61  Pulse: 71 83  Resp: 17 17  Temp:    SpO2: 95% 100%     Body mass index is 22.28 kg/m.   Wt Readings from Last 3 Encounters:  08/19/17 138 lb 0.1 oz (62.6 kg)  02/16/17 146 lb (66.2 kg)  12/05/16 151 lb 9.6 oz (68.8 kg)      ECOG FS:0 - Asymptomatic  Ocular: Sclerae unicteric, pupils round and equal Ear-nose-throat: Oropharynx clear and moist Lymphatic: No cervical or supraclavicular adenopathy, no cervical adenopathy Lungs no rales or rhonchi Heart regular rate and rhythm Abd soft, nontender, positive bowel sounds, no palpable splenomegaly MSK no focal spinal tenderness, no joint edema Neuro: non-focal, well-oriented, appropriate affect  LAB RESULTS:  CMP     Component Value Date/Time   NA 139 08/19/2017 0429   K 3.4 (L) 08/19/2017 0429   CL 102 08/19/2017 0429   CO2 29 08/19/2017 0429   GLUCOSE 90 08/19/2017 0429   BUN 10 08/19/2017 0429   CREATININE 1.18 08/19/2017 0429   CALCIUM 9.0 08/19/2017 0429   PROT 6.9 07/17/2016 0856   ALBUMIN 4.3 07/17/2016 0856   AST 11 07/17/2016 0856   ALT 6 07/17/2016 0856   ALKPHOS 52 07/17/2016 0856   BILITOT 0.7 07/17/2016 0856   GFRNONAA 52 (L) 08/19/2017 0429   GFRAA >60 08/19/2017 0429    No results found for: TOTALPROTELP, ALBUMINELP, A1GS, A2GS, BETS, BETA2SER, GAMS, MSPIKE, SPEI  No results found for: KPAFRELGTCHN, LAMBDASER, KAPLAMBRATIO  Lab Results  Component Value Date   WBC 4.9 08/19/2017   NEUTROABS 2.8 08/18/2017   HGB 8.2 (L) 08/19/2017   HCT 25.7 (L)  08/19/2017   MCV 87.4 08/19/2017   PLT 49 (L) 08/19/2017    @LASTCHEMISTRY @  No results found for: LABCA2  No components found for: ERDEYC144  No results for input(s): INR in the last 168 hours.  No results found for: LABCA2  No results found for: YJE563  No results found for: JSH702  No results found for: OVZ858  No results found for: CA2729  No components found for: HGQUANT  No results found for: CEA1 / No results found for: CEA1   No results found for: AFPTUMOR  No results found for: CHROMOGRNA  No results found for: PSA1  Admission on 08/18/2017  Component Date Value Ref Range Status  . WBC 08/18/2017  5.1  4.0 - 10.5 K/uL Final  . RBC 08/18/2017 3.25* 4.22 - 5.81 MIL/uL Final  . Hemoglobin 08/18/2017 9.3* 13.0 - 17.0 g/dL Final  . HCT 08/18/2017 28.4* 39.0 - 52.0 % Final  . MCV 08/18/2017 87.4  78.0 - 100.0 fL Final  . MCH 08/18/2017 28.6  26.0 - 34.0 pg Final  . MCHC 08/18/2017 32.7  30.0 - 36.0 g/dL Final  . RDW 08/18/2017 15.5  11.5 - 15.5 % Final  . Platelets 08/18/2017 55* 150 - 400 K/uL Final   Comment: REPEATED TO VERIFY PLATELET COUNT CONFIRMED BY SMEAR   . Neutrophils Relative % 08/18/2017 56  % Final  . Neutro Abs 08/18/2017 2.8  1.7 - 7.7 K/uL Final  . Lymphocytes Relative 08/18/2017 28  % Final  . Lymphs Abs 08/18/2017 1.4  0.7 - 4.0 K/uL Final  . Monocytes Relative 08/18/2017 15  % Final  . Monocytes Absolute 08/18/2017 0.8  0.1 - 1.0 K/uL Final  . Eosinophils Relative 08/18/2017 1  % Final  . Eosinophils Absolute 08/18/2017 0.0  0.0 - 0.7 K/uL Final  . Basophils Relative 08/18/2017 0  % Final  . Basophils Absolute 08/18/2017 0.0  0.0 - 0.1 K/uL Final  . Sodium 08/18/2017 141  135 - 145 mmol/L Final  . Potassium 08/18/2017 3.5  3.5 - 5.1 mmol/L Final  . Chloride 08/18/2017 100* 101 - 111 mmol/L Final  . BUN 08/18/2017 9  6 - 20 mg/dL Final  . Creatinine, Ser 08/18/2017 1.20  0.61 - 1.24 mg/dL Final  . Glucose, Bld 08/18/2017 99  65 - 99  mg/dL Final  . Calcium, Ion 08/18/2017 1.16  1.15 - 1.40 mmol/L Final  . TCO2 08/18/2017 29  22 - 32 mmol/L Final  . Hemoglobin 08/18/2017 9.2* 13.0 - 17.0 g/dL Final  . HCT 08/18/2017 27.0* 39.0 - 52.0 % Final  . Troponin i, poc 08/18/2017 0.02  0.00 - 0.08 ng/mL Final  . Comment 3 08/18/2017          Final   Comment: Due to the release kinetics of cTnI, a negative result within the first hours of the onset of symptoms does not rule out myocardial infarction with certainty. If myocardial infarction is still suspected, repeat the test at appropriate intervals.   . Color, Urine 08/18/2017 YELLOW  YELLOW Final  . APPearance 08/18/2017 CLEAR  CLEAR Final  . Specific Gravity, Urine 08/18/2017 <1.005* 1.005 - 1.030 Final  . pH 08/18/2017 7.0  5.0 - 8.0 Final  . Glucose, UA 08/18/2017 NEGATIVE  NEGATIVE mg/dL Final  . Hgb urine dipstick 08/18/2017 NEGATIVE  NEGATIVE Final  . Bilirubin Urine 08/18/2017 NEGATIVE  NEGATIVE Final  . Ketones, ur 08/18/2017 NEGATIVE  NEGATIVE mg/dL Final  . Protein, ur 08/18/2017 NEGATIVE  NEGATIVE mg/dL Final  . Nitrite 08/18/2017 NEGATIVE  NEGATIVE Final  . Leukocytes, UA 08/18/2017 NEGATIVE  NEGATIVE Final  . RBC / HPF 08/18/2017 0-5  0 - 5 RBC/hpf Final  . WBC, UA 08/18/2017 0-5  0 - 5 WBC/hpf Final  . Bacteria, UA 08/18/2017 RARE* NONE SEEN Final  . Squamous Epithelial / LPF 08/18/2017 NONE SEEN  NONE SEEN Final  . MRSA by PCR 08/18/2017 NEGATIVE  NEGATIVE Final   Comment:        The GeneXpert MRSA Assay (FDA approved for NASAL specimens only), is one component of a comprehensive MRSA colonization surveillance program. It is not intended to diagnose MRSA infection nor to guide or monitor treatment for MRSA infections.   Marland Kitchen  TSH 08/18/2017 1.276  0.350 - 4.500 uIU/mL Final   Performed by a 3rd Generation assay with a functional sensitivity of <=0.01 uIU/mL.  . Hgb A1c MFr Bld 08/18/2017 4.9  4.8 - 5.6 % Final   Comment: (NOTE) Pre diabetes:           5.7%-6.4% Diabetes:              >6.4% Glycemic control for   <7.0% adults with diabetes   . Mean Plasma Glucose 08/18/2017 93.93  mg/dL Final  . Cholesterol 08/18/2017 107  0 - 200 mg/dL Final  . Triglycerides 08/18/2017 39  <150 mg/dL Final  . HDL 08/18/2017 44  >40 mg/dL Final  . Total CHOL/HDL Ratio 08/18/2017 2.4  RATIO Final  . VLDL 08/18/2017 8  0 - 40 mg/dL Final  . LDL Cholesterol 08/18/2017 55  0 - 99 mg/dL Final   Comment:        Total Cholesterol/HDL:CHD Risk Coronary Heart Disease Risk Table                     Men   Women  1/2 Average Risk   3.4   3.3  Average Risk       5.0   4.4  2 X Average Risk   9.6   7.1  3 X Average Risk  23.4   11.0        Use the calculated Patient Ratio above and the CHD Risk Table to determine the patient's CHD Risk.        ATP III CLASSIFICATION (LDL):  <100     mg/dL   Optimal  100-129  mg/dL   Near or Above                    Optimal  130-159  mg/dL   Borderline  160-189  mg/dL   High  >190     mg/dL   Very High   . Sodium 08/19/2017 139  135 - 145 mmol/L Final  . Potassium 08/19/2017 3.4* 3.5 - 5.1 mmol/L Final  . Chloride 08/19/2017 102  101 - 111 mmol/L Final  . CO2 08/19/2017 29  22 - 32 mmol/L Final  . Glucose, Bld 08/19/2017 90  65 - 99 mg/dL Final  . BUN 08/19/2017 10  6 - 20 mg/dL Final  . Creatinine, Ser 08/19/2017 1.18  0.61 - 1.24 mg/dL Final  . Calcium 08/19/2017 9.0  8.9 - 10.3 mg/dL Final  . GFR calc non Af Amer 08/19/2017 52* >60 mL/min Final  . GFR calc Af Amer 08/19/2017 >60  >60 mL/min Final   Comment: (NOTE) The eGFR has been calculated using the CKD EPI equation. This calculation has not been validated in all clinical situations. eGFR's persistently <60 mL/min signify possible Chronic Kidney Disease.   . Anion gap 08/19/2017 8  5 - 15 Final  . WBC 08/19/2017 4.9  4.0 - 10.5 K/uL Final  . RBC 08/19/2017 2.94* 4.22 - 5.81 MIL/uL Final  . Hemoglobin 08/19/2017 8.2* 13.0 - 17.0 g/dL Final  . HCT  08/19/2017 25.7* 39.0 - 52.0 % Final  . MCV 08/19/2017 87.4  78.0 - 100.0 fL Final  . MCH 08/19/2017 27.9  26.0 - 34.0 pg Final  . MCHC 08/19/2017 31.9  30.0 - 36.0 g/dL Final  . RDW 08/19/2017 15.6* 11.5 - 15.5 % Final  . Platelets 08/19/2017 49* 150 - 400 K/uL Final   Comment: REPEATED TO VERIFY SPECIMEN CHECKED FOR  CLOTS PLATELET COUNT CONFIRMED BY SMEAR     (this displays the last labs from the last 3 days)  No results found for: TOTALPROTELP, ALBUMINELP, A1GS, A2GS, BETS, BETA2SER, GAMS, MSPIKE, SPEI (this displays SPEP labs)  No results found for: KPAFRELGTCHN, LAMBDASER, KAPLAMBRATIO (kappa/lambda light chains)  No results found for: HGBA, HGBA2QUANT, HGBFQUANT, HGBSQUAN (Hemoglobinopathy evaluation)   No results found for: LDH  No results found for: IRON, TIBC, IRONPCTSAT (Iron and TIBC)  No results found for: FERRITIN  Urinalysis    Component Value Date/Time   COLORURINE YELLOW 08/18/2017 0242   APPEARANCEUR CLEAR 08/18/2017 0242   LABSPEC <1.005 (L) 08/18/2017 0242   PHURINE 7.0 08/18/2017 0242   GLUCOSEU NEGATIVE 08/18/2017 0242   HGBUR NEGATIVE 08/18/2017 0242   BILIRUBINUR NEGATIVE 08/18/2017 0242   KETONESUR NEGATIVE 08/18/2017 0242   PROTEINUR NEGATIVE 08/18/2017 0242   UROBILINOGEN 0.2 09/16/2012 1255   NITRITE NEGATIVE 08/18/2017 0242   LEUKOCYTESUR NEGATIVE 08/18/2017 0242     STUDIES: Ct Head Wo Contrast  Result Date: 08/18/2017 CLINICAL DATA:  Fall, does not remember events surrounding the fall. EXAM: CT HEAD WITHOUT CONTRAST CT CERVICAL SPINE WITHOUT CONTRAST TECHNIQUE: Multidetector CT imaging of the head and cervical spine was performed following the standard protocol without intravenous contrast. Multiplanar CT image reconstructions of the cervical spine were also generated. COMPARISON:  Head CT 09/16/2012 FINDINGS: CT HEAD FINDINGS Brain: Age related atrophy. Mild chronic small vessel ischemia. No intracranial hemorrhage, mass effect, or  midline shift. No hydrocephalus. The basilar cisterns are patent. No evidence of territorial infarct or acute ischemia. No extra-axial or intracranial fluid collection. Vascular: Atherosclerosis of skullbase vasculature without hyperdense vessel or abnormal calcification. Skull: No skull fracture or focal lesion. Sinuses/Orbits: Paranasal sinuses and mastoid air cells are clear. The visualized orbits are unremarkable. Bilateral cataract resection. Other: None. CT CERVICAL SPINE FINDINGS Alignment: Degenerative-type anterolisthesis of C7 on T1. Anterior fusion C4 through C7 with plate and screw fixation. No traumatic subluxation. Skull base and vertebrae: The dens and skull base are intact. Anterior C4 through C7 fusion with intact hardware. Solid bony fusion C4 through C5, incomplete bony fusion C6-C7. No acute fracture. Soft tissues and spinal canal: No prevertebral fluid or swelling. No visible canal hematoma. Disc levels: Anterior C4 through C7 fusion. Adjacent level degenerative disc disease and facet arthropathy. Upper chest: No acute abnormality.  Suspect mild emphysema. Other: Carotid atherosclerosis. IMPRESSION: 1.  No acute intracranial abnormality.  No skull fracture. 2. Degenerative and postsurgical change in the cervical spine without acute fracture or subluxation. Electronically Signed   By: Jeb Levering M.D.   On: 08/18/2017 03:22   Ct Cervical Spine Wo Contrast  Result Date: 08/18/2017 CLINICAL DATA:  Fall, does not remember events surrounding the fall. EXAM: CT HEAD WITHOUT CONTRAST CT CERVICAL SPINE WITHOUT CONTRAST TECHNIQUE: Multidetector CT imaging of the head and cervical spine was performed following the standard protocol without intravenous contrast. Multiplanar CT image reconstructions of the cervical spine were also generated. COMPARISON:  Head CT 09/16/2012 FINDINGS: CT HEAD FINDINGS Brain: Age related atrophy. Mild chronic small vessel ischemia. No intracranial hemorrhage, mass  effect, or midline shift. No hydrocephalus. The basilar cisterns are patent. No evidence of territorial infarct or acute ischemia. No extra-axial or intracranial fluid collection. Vascular: Atherosclerosis of skullbase vasculature without hyperdense vessel or abnormal calcification. Skull: No skull fracture or focal lesion. Sinuses/Orbits: Paranasal sinuses and mastoid air cells are clear. The visualized orbits are unremarkable. Bilateral cataract resection. Other: None. CT CERVICAL SPINE  FINDINGS Alignment: Degenerative-type anterolisthesis of C7 on T1. Anterior fusion C4 through C7 with plate and screw fixation. No traumatic subluxation. Skull base and vertebrae: The dens and skull base are intact. Anterior C4 through C7 fusion with intact hardware. Solid bony fusion C4 through C5, incomplete bony fusion C6-C7. No acute fracture. Soft tissues and spinal canal: No prevertebral fluid or swelling. No visible canal hematoma. Disc levels: Anterior C4 through C7 fusion. Adjacent level degenerative disc disease and facet arthropathy. Upper chest: No acute abnormality.  Suspect mild emphysema. Other: Carotid atherosclerosis. IMPRESSION: 1.  No acute intracranial abnormality.  No skull fracture. 2. Degenerative and postsurgical change in the cervical spine without acute fracture or subluxation. Electronically Signed   By: Jeb Levering M.D.   On: 08/18/2017 03:22   Dg Chest Portable 1 View  Result Date: 08/18/2017 CLINICAL DATA:  Fall. EXAM: PORTABLE CHEST 1 VIEW COMPARISON:  09/02/2015 FINDINGS: The heart is enlarged. Atherosclerosis of the aortic arch. No pulmonary edema. No focal airspace opacity. No pneumothorax, multiple skin folds project over the left chest. No large pleural effusion. No evidence of acute osseous abnormality. IMPRESSION: Cardiomegaly with thoracic aortic atherosclerosis. No acute or evidence of traumatic abnormality. Electronically Signed   By: Jeb Levering M.D.   On: 08/18/2017 02:58     ELIGIBLE FOR AVAILABLE RESEARCH PROTOCOL: no  ASSESSMENT: 83 y.o. [place] man admitted 08/18/2017 for evaluation of syncope, found to be moderately anemic and thrombocytopenic  REVIEW OF BLOOD FILM: There are rare giant platelets. There are no artifactual platelet clumps. There is no significant left shift in the neutrophil series. There are some atypical lymphocytes. These are not large granular lymphocytes. The red cells showed no schistocytes and no significant anisocytosis. There are no nucleated red cells. There is no polychromasia  PLAN: I spent a little over 40 minutes with the patient reviewing his situation. He understands that all blood cells are made in the marrow of bones in that we frequently do a bone marrow biopsy to find out what is going on "at the factory". In his case the red cells are few in number although morphologically they are normal. In elderly patients like him frequently this is due to renal insufficiency but actually he has an excellent EGFR for his age. The MCV does not suggest a vitamin deficiency or iron deficiency, but of course this needs to be ruled out.   The platelets are significantly low, although there is no evidence of bleeding. There are no schistocytes and together with the benign clinical picture DIC and TTP are not in the differential. He was not receiving heparin at the time of the initial determination. Accordingly we are not sending a hit panel. 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  The white cells are not suggestive of leukemia but the atypical lymphocytes may indicate a low-grade infiltrative tumor such as a low-grade lymphoma, and this also will need to be worked up.  I explained to Mr. Davis that we will be sending quite a bit of lab work tomorrow. This will include in addition to the usual anemia workup labs for myeloma and flow cytometry for a monoclonal lymphoid population. I do not  anticipate having all the results by the time he is discharged. I will make sure he has appropriate follow-up either with me or one of my partners or if everything is benign with Dr. Elease Hashimoto  Appreciate consulting on this case  Chauncey Cruel, MD  08/19/2017 2:33 PM [facility] [address] [place], [place] [address] Tel. [phone]    Fax. [phone]

## 2017-08-20 ENCOUNTER — Inpatient Hospital Stay (HOSPITAL_COMMUNITY): Payer: Medicare Other

## 2017-08-20 ENCOUNTER — Other Ambulatory Visit: Payer: Self-pay | Admitting: Family Medicine

## 2017-08-20 ENCOUNTER — Encounter (HOSPITAL_COMMUNITY)
Admission: EM | Disposition: A | Discharge status: Home / Self Care | Payer: Self-pay | Source: Home / Self Care | Attending: Cardiology

## 2017-08-20 DIAGNOSIS — I441 Atrioventricular block, second degree: Secondary | ICD-10-CM

## 2017-08-20 DIAGNOSIS — R9431 Abnormal electrocardiogram [ECG] [EKG]: Secondary | ICD-10-CM

## 2017-08-20 HISTORY — PX: LOOP RECORDER INSERTION: EP1214

## 2017-08-20 LAB — RETICULOCYTES
RBC.: 3.26 MIL/uL — AB (ref 4.22–5.81)
RETIC CT PCT: 3.4 % — AB (ref 0.4–3.1)
Retic Count, Absolute: 110.8 10*3/uL (ref 19.0–186.0)

## 2017-08-20 LAB — CBC WITH DIFFERENTIAL/PLATELET
BASOS ABS: 0 10*3/uL (ref 0.0–0.1)
BASOS PCT: 0 %
EOS PCT: 1 %
Eosinophils Absolute: 0 10*3/uL (ref 0.0–0.7)
HEMATOCRIT: 28.3 % — AB (ref 39.0–52.0)
Hemoglobin: 9.3 g/dL — ABNORMAL LOW (ref 13.0–17.0)
Lymphocytes Relative: 24 %
Lymphs Abs: 1 10*3/uL (ref 0.7–4.0)
MCH: 28.5 pg (ref 26.0–34.0)
MCHC: 32.9 g/dL (ref 30.0–36.0)
MCV: 86.8 fL (ref 78.0–100.0)
MONO ABS: 0.8 10*3/uL (ref 0.1–1.0)
Monocytes Relative: 18 %
NEUTROS ABS: 2.5 10*3/uL (ref 1.7–7.7)
Neutrophils Relative %: 57 %
PLATELETS: 53 10*3/uL — AB (ref 150–400)
RBC: 3.26 MIL/uL — ABNORMAL LOW (ref 4.22–5.81)
RDW: 15.7 % — AB (ref 11.5–15.5)
WBC: 4.3 10*3/uL (ref 4.0–10.5)

## 2017-08-20 LAB — COMPREHENSIVE METABOLIC PANEL
ALBUMIN: 3.9 g/dL (ref 3.5–5.0)
ALK PHOS: 55 U/L (ref 38–126)
ALT: 7 U/L — ABNORMAL LOW (ref 17–63)
ANION GAP: 10 (ref 5–15)
AST: 16 U/L (ref 15–41)
BILIRUBIN TOTAL: 0.8 mg/dL (ref 0.3–1.2)
BUN: 9 mg/dL (ref 6–20)
CALCIUM: 9.4 mg/dL (ref 8.9–10.3)
CO2: 29 mmol/L (ref 22–32)
Chloride: 101 mmol/L (ref 101–111)
Creatinine, Ser: 1.19 mg/dL (ref 0.61–1.24)
GFR calc Af Amer: >60 mL/min (ref >60)
GFR, EST NON AFRICAN AMERICAN: 52 mL/min — AB (ref >60)
GLUCOSE: 99 mg/dL (ref 65–99)
Potassium: 3.3 mmol/L — ABNORMAL LOW (ref 3.5–5.1)
Sodium: 140 mmol/L (ref 135–145)
TOTAL PROTEIN: 6.6 g/dL (ref 6.5–8.1)

## 2017-08-20 LAB — LACTATE DEHYDROGENASE: LDH: 165 U/L (ref 98–192)

## 2017-08-20 LAB — ECHOCARDIOGRAM COMPLETE
Height: 66 in
Weight: 2168 oz

## 2017-08-20 LAB — FOLATE: Folate: 10.2 ng/mL (ref >5.9)

## 2017-08-20 LAB — VITAMIN B12: Vitamin B-12: 1374 pg/mL — ABNORMAL HIGH (ref 180–914)

## 2017-08-20 LAB — FERRITIN: FERRITIN: 133 ng/mL (ref 24–336)

## 2017-08-20 SURGERY — LOOP RECORDER INSERTION

## 2017-08-20 MED ORDER — LIDOCAINE-EPINEPHRINE 1 %-1:100000 IJ SOLN
INTRAMUSCULAR | Status: DC | PRN
  Administered 2017-08-20: 10 mL via INTRADERMAL

## 2017-08-20 SURGICAL SUPPLY — 2 items
LOOP REVEAL LINQSYS (Prosthesis & Implant Heart) ×2 IMPLANT
PACK LOOP INSERTION (CUSTOM PROCEDURE TRAY) ×3 IMPLANT

## 2017-08-20 NOTE — Progress Notes (Signed)
  Echocardiogram 2D Echocardiogram has been performed.  Joshua Burns 08/20/2017, 2:16 PM

## 2017-08-20 NOTE — Op Note (Signed)
LOOP RECORDER IMPLANT   Procedure report  Procedure performed:  Loop recorder implantation   Reason for procedure:  1. Recurrent syncope/near-syncope 2. Cryptogenic stroke Procedure performed by:  Sanda Klein, MD  Complications:  None  Estimated blood loss:  <5 mL  Medications administered during procedure:  Lidocaine 1% with 1/10,000 epinephrine 10 mL locally Device details:  Medtronic Reveal Linq model number [id number], serial number [id number] Procedure details:  After the risks and benefits of the procedure were discussed the patient provided informed consent. The patient was prepped and draped in usual sterile fashion. Local anesthesia was administered to an area 2 cm to the left of the sternum in the 4th intercostal space. A cutaneous incision was made using the incision tool. The introducer was then used to create a subcutaneous tunnel and carefully deploy the device. Local pressure was held to ensure hemostasis.  The incision was closed with SteriStrips and a sterile dressing was applied.  R waves 0.43-0.60 mV.  Sanda Klein, MD, [employer] [phone] office [phone] pager 08/20/2017 12:40 PM

## 2017-08-20 NOTE — Progress Notes (Signed)
Progress Note  Patient Name: Joshua Burns Date of Encounter: 08/20/2017  Primary Cardiologist: [facility] (Dr. Percival Spanish)  Subjective   No complaints. He denies palpitations, CP and dyspnea. No further syncope/falls.   Inpatient Medications    Scheduled Meds: . aspirin EC  325 mg Oral q morning - 10a  . donepezil  10 mg Oral QHS  . doxazosin  4 mg Oral QHS  . hydrochlorothiazide  12.5 mg Oral Daily  . simvastatin  20 mg Oral QHS   Continuous Infusions:  PRN Meds:    Vital Signs    Vitals:   08/19/17 2104 08/19/17 2126 08/20/17 0005 08/20/17 0615  BP: (!) 169/72 (!) 162/69 (!) 206/79 (!) 151/61  Pulse:  69 92 68  Resp:  18 18 18   Temp:  98.3 F (36.8 C) 98.5 F (36.9 C) 98.5 F (36.9 C)  TempSrc:  Oral Oral Oral  SpO2:  100% 98% 98%  Weight:    135 lb 8 oz (61.5 kg)  Height:        Intake/Output Summary (Last 24 hours) at 08/20/17 0843 Last data filed at 08/19/17 1800  Gross per 24 hour  Intake              720 ml  Output              200 ml  Net              520 ml   Filed Weights   08/19/17 0600 08/19/17 1524 08/20/17 0615  Weight: 138 lb 0.1 oz (62.6 kg) 138 lb 12.8 oz (63 kg) 135 lb 8 oz (61.5 kg)    Telemetry    Sinus tach- Personally Reviewed  ECG    NSR 61 bpm  - Personally Reviewed  Physical Exam   GEN: No acute distress.   ENT: laceration above left eye - stable  Neck: No JVD Cardiac: regular rhythm, tachy rate, no murmurs, rubs, or gallops.  Respiratory: Clear to auscultation bilaterally. GI: Soft, nontender, non-distended  MS: No edema; No deformity. Neuro:  Nonfocal  Psych: Normal affect   Labs    Chemistry Recent Labs Lab 08/18/17 0348 08/19/17 0429  NA 141 139  K 3.5 3.4*  CL 100* 102  CO2  --  29  GLUCOSE 99 90  BUN 9 10  CREATININE 1.20 1.18  CALCIUM  --  9.0  GFRNONAA  --  52*  GFRAA  --  >60  ANIONGAP  --  8     Hematology Recent Labs Lab 08/18/17 0330 08/18/17 0348 08/19/17 0429  WBC 5.1  --  4.9  RBC  3.25*  --  2.94*  HGB 9.3* 9.2* 8.2*  HCT 28.4* 27.0* 25.7*  MCV 87.4  --  87.4  MCH 28.6  --  27.9  MCHC 32.7  --  31.9  RDW 15.5  --  15.6*  PLT 55*  --  49*    Cardiac EnzymesNo results for input(s): TROPONINI in the last 168 hours.  Recent Labs Lab 08/18/17 0347  TROPIPOC 0.02     BNPNo results for input(s): BNP, PROBNP in the last 168 hours.   DDimer No results for input(s): DDIMER in the last 168 hours.   Radiology    No results found.  Cardiac Studies   2D Echo pending.    Patient Profile     83 y.o. male with history of HTN and mild cognitive impairment who presented to the ED after a syncopal episode, who  was admitted to cardiology for sinus pauses.   Assessment & Plan    1. Syncope: unwitnessed event. Pt was alone when episode occurred.  There is a mention of 6 second pauses on the monitor while in the ED. No orthostatic VS documented. Check orthostatics. Also, recent syncopal spell and sinus pauses may be medication induced. Pt is on donepezil for dementia. Per literature review,  "Cholinesterase inhibitors may have a vasotonic effect which may cause bradycardia and or heart block in patients with or without history of cardiac disease. Syncopal episodes have been associated with donepezil" - Up to Date. He is not on any AV nodal blocking agents. TSH WNL. K stable. Initial troponin in ED was negative. 2D echo pending. He denies any recurrent symptoms. Telemetry over the last 12 hrs show sinus tach (140s) and some recurrent bradycardia, with rates in the upper 30s, however no recurrent pauses. ? SSS. He will need EP consult. ? Need for possible PPM. Continue to observe on telemetry. MD to follow with further recs.   Signed, Lyda Jester, PA-C  08/20/2017, 8:43 AM    I have seen and examined the patient along with Lyda Jester, PA-C.  I have reviewed the chart, notes and new data.  I agree with PA's note.  Key new complaints: he believes he did not have  syncope, just lost balance. Key examination changes: normal CV exam. Left periorbital hematoma. Key new findings / data: Overnight second-degree AV block Mobitz type I with occasional pauses of 102 seconds and minimum heart rate of 47 bpm. No significant pauses seen. While walking with physical therapy today he was easily able to gradually achieve sinus tachycardia rates in the 160s, with occasional PVCs and without significant second-degree AV block. Reportedly had a 6 second pause in the emergency room, but there is no documentation of this that I can find.  PLAN: Elderly gentleman with some evidence of conduction system disease and taking one medication that could cause bradycardia (donepezil), which seems to have had beneficial effects on his cognitive ability. At this point there is definitely not enough evidence that he needs pacemaker therapy. Discussed options for further rhythm monitoring with the patient, as well as with his son and daughter. Offered 30 day event monitor or implantable loop recorder. The yield would definitely be better on the implantable device and the family thinks this would be preferable sinus likely he will forget how to connect the wearable device and might even forget its purpose, making it a less reliable solution. This procedure has been fully reviewed with the patient and written informed consent has been obtained.   Sanda Klein, MD, [place] [phone] 08/20/2017, 10:08 AM

## 2017-08-20 NOTE — Discharge Instructions (Signed)
Supplemental Discharge Instructions for  Pacemaker/Defibrillator/Loop recorder Patients  Activity No restrictions. DO wear your seatbelt, even if it crosses over the device site.  WOUND CARE - Keep the wound area clean and dry.  Remove the dressing the day after you return home (usually 48 hours after the procedure). - DO NOT SUBMERGE UNDER WATER UNTIL FULLY HEALED (no tub baths, hot tubs, swimming pools, etc.).  - You  may shower or take a sponge bath after the dressing is removed. DO NOT SOAK the area and do not allow the shower to directly spray on the site. - If you have tape/steri-strips on your wound, these will fall off; do not pull them off prematurely.   - No bandage is needed on the site.  DO  NOT apply any creams, oils, or ointments to the wound area. - If you notice any drainage or discharge from the wound, any swelling, excessive redness or bruising at the site, or if you develop a fever > 101? F after you are discharged home, call the office at once.  Special Instructions - You are still able to use cellular telephones.  Avoid carrying your cellular phone near your device. - When traveling through airports, show security personnel your identification card to avoid being screened in the metal detectors.  - Avoid arc welding equipment, MRI testing (magnetic resonance imaging), TENS units (transcutaneous nerve stimulators).  Call the office for questions about other devices. - Avoid electrical appliances that are in poor condition or are not properly grounded. - Microwave ovens are safe to be near or to operate.

## 2017-08-21 ENCOUNTER — Encounter (HOSPITAL_COMMUNITY): Payer: Self-pay | Admitting: Cardiovascular Disease

## 2017-08-21 DIAGNOSIS — E876 Hypokalemia: Secondary | ICD-10-CM

## 2017-08-21 LAB — MULTIPLE MYELOMA PANEL, SERUM
Albumin SerPl Elph-Mcnc: 3.6 g/dL (ref 2.9–4.4)
Albumin/Glob SerPl: 1.3 (ref 0.7–1.7)
Alpha 1: 0.2 g/dL (ref 0.0–0.4)
Alpha2 Glob SerPl Elph-Mcnc: 0.6 g/dL (ref 0.4–1.0)
B-Globulin SerPl Elph-Mcnc: 0.7 g/dL (ref 0.7–1.3)
Gamma Glob SerPl Elph-Mcnc: 1.2 g/dL (ref 0.4–1.8)
Globulin, Total: 2.8 g/dL (ref 2.2–3.9)
IgA: 128 mg/dL (ref 61–437)
IgG (Immunoglobin G), Serum: 1256 mg/dL (ref 700–1600)
IgM (Immunoglobulin M), Srm: 96 mg/dL (ref 15–143)
Total Protein ELP: 6.4 g/dL (ref 6.0–8.5)

## 2017-08-21 MED ORDER — ASPIRIN EC 81 MG PO TBEC: 81.0000 mg | DELAYED_RELEASE_TABLET | Freq: Every morning | ORAL | Status: DC

## 2017-08-21 MED ORDER — POTASSIUM CHLORIDE CRYS ER 20 MEQ PO TBCR
40.0000 meq | EXTENDED_RELEASE_TABLET | Freq: Once | ORAL | Status: AC
  Administered 2017-08-21: 40 meq via ORAL
  Filled 2017-08-21: qty 2

## 2017-08-21 MED ORDER — POTASSIUM CHLORIDE CRYS ER 20 MEQ PO TBCR: 20.0000 meq | EXTENDED_RELEASE_TABLET | Freq: Every day | ORAL | 4 refills | Status: DC

## 2017-08-21 NOTE — Progress Notes (Signed)
Discharged home with family, self care. Son present at the time of discharge to transport home. All D/C instructions given and explained. Verbalized and demonstrated understanding. Ebunoluwa Gernert Ladora Daniel, RN

## 2017-08-21 NOTE — Progress Notes (Signed)
Progress Note  Patient Name: Joshua Burns Date of Encounter: 08/21/2017  Primary Cardiologist: Dr. Percival Spanish ([facility])  Subjective   No complaints. He his ready to go home. He denies any recurrent syncope. No palpitations, chest pain, dyspnea, dizziness.   Inpatient Medications    Scheduled Meds: . aspirin EC  325 mg Oral q morning - 10a  . donepezil  10 mg Oral QHS  . doxazosin  4 mg Oral QHS  . hydrochlorothiazide  12.5 mg Oral Daily  . simvastatin  20 mg Oral QHS   Continuous Infusions:  PRN Meds:    Vital Signs    Vitals:   08/20/17 1200 08/20/17 2038 08/21/17 0042 08/21/17 0521  BP: (!) 129/56 132/68 135/62 125/65  Pulse: 77 95 76 68  Resp: 18 18 18 18   Temp: 98.7 F (37.1 C) 98.6 F (37 C) 98.4 F (36.9 C) 98.6 F (37 C)  TempSrc: Oral Oral Oral Oral  SpO2:  100% 98% 98%  Weight:    134 lb 14.4 oz (61.2 kg)  Height:        Intake/Output Summary (Last 24 hours) at 08/21/17 0933 Last data filed at 08/21/17 0908  Gross per 24 hour  Intake              540 ml  Output                0 ml  Net              540 ml   Filed Weights   08/19/17 1524 08/20/17 0615 08/21/17 0521  Weight: 138 lb 12.8 oz (63 kg) 135 lb 8 oz (61.5 kg) 134 lb 14.4 oz (61.2 kg)    Telemetry    currently NSR, intermittent bradycardia into the upper 30s and intermittent 2nd degree AVB, Mobitz type I, no significant pauses - Personally Reviewed  ECG    NSR- Personally Reviewed  Physical Exam   GEN: No acute distress.   Neck: No JVD Cardiac: RRR, no murmurs, rubs, or gallops.  Respiratory: Clear to auscultation bilaterally. GI: Soft, nontender, non-distended  MS: No edema; No deformity. Neuro:  Nonfocal  Psych: Normal affect   Labs    Chemistry Recent Labs Lab 08/18/17 0348 08/19/17 0429 08/20/17 0805  NA 141 139 140  K 3.5 3.4* 3.3*  CL 100* 102 101  CO2  --  29 29  GLUCOSE 99 90 99  BUN 9 10 9   CREATININE 1.20 1.18 1.19  CALCIUM  --  9.0 9.4  PROT  --   --   6.6  ALBUMIN  --   --  3.9  AST  --   --  16  ALT  --   --  7*  ALKPHOS  --   --  55  BILITOT  --   --  0.8  GFRNONAA  --  52* 52*  GFRAA  --  >60 >60  ANIONGAP  --  8 10     Hematology Recent Labs Lab 08/18/17 0330 08/18/17 0348 08/19/17 0429 08/20/17 0805  WBC 5.1  --  4.9 4.3  RBC 3.25*  --  2.94* 3.26*  3.26*  HGB 9.3* 9.2* 8.2* 9.3*  HCT 28.4* 27.0* 25.7* 28.3*  MCV 87.4  --  87.4 86.8  MCH 28.6  --  27.9 28.5  MCHC 32.7  --  31.9 32.9  RDW 15.5  --  15.6* 15.7*  PLT 55*  --  49* 53*    Cardiac EnzymesNo  results for input(s): TROPONINI in the last 168 hours.  Recent Labs Lab 08/18/17 0347  TROPIPOC 0.02     BNPNo results for input(s): BNP, PROBNP in the last 168 hours.   DDimer No results for input(s): DDIMER in the last 168 hours.   Radiology    No results found.  Cardiac Studies   2D Echo   Study Conclusions  - Left ventricle: The cavity size was normal. Systolic function was   normal. Wall motion was normal; there were no regional wall   motion abnormalities. Doppler parameters are consistent with   abnormal left ventricular relaxation (grade 1 diastolic   dysfunction). - Aortic valve: There was no regurgitation. - Mitral valve: There was mild regurgitation. - Left atrium: The atrium was normal in size. - Right ventricle: Systolic function was normal. - Right atrium: The atrium was normal in size. - Tricuspid valve: There was mild regurgitation. - Pulmonic valve: There was trivial regurgitation. - Pulmonary arteries: Systolic pressure was mildly increased. PA   peak pressure: 34 mm Hg (S). - Inferior vena cava: The vessel was normal in size. - Pericardium, extracardiac: There was no pericardial effusion.  Patient Profile     [name], who presented to the ED after a syncopal episode, who was admitted to cardiology for ? sinus pauses. There was mention of 6 sec pause in the  ED, however there is no documentation of this (no rhythms strips).    Assessment & Plan    1. Syncope: unwitnessed event. Pt was alone when episode occurred. 2D echo unremarkable with normal LVF, wall motion and no significant valvular disease. There was mention of 6 second pauses on the monitor while in the ED, however there is no documentation of this. While under observation by telemetry, he has been noted to have second-degree AV block Mobitz type I with occasional pauses of 1-2 seconds and mild intermittent bradycardia, however no significant pauses. He is on donepezil for mild dementia, which could be contributing to his bradycardia, however plan is to continue this given it has had a beneficial effect on his cognitive function. He has also had some occasional sinus tach (episode last night in the 140s). Pt underwent placement of an implantable loop recorder yesterday. If evidence of significant symptomatic bradycardia, pauses or high degree AVB, then we can later consider outpatient EP consultation for PPM. However not enough evidence at this time to justify PPM. Plan is for d/c home today. We will arrange office f/u in our clinic. His Loop recorder will be monitored.   2. Hypokalemia: K is 3.3 today. He is on HCTZ for BP. We will give supplemental K-dur prior to d/c. He may need daily supplemental K-dur going forward. He will need a f/u BMP as an outpatient.     Signed, Lyda Jester, PA-C  08/21/2017, 9:33 AM    I have seen and examined the patient along with Lyda Jester, PA-C .  I have reviewed the chart, notes and new data.  I agree with PA's note.  Key new complaints: Feels well, denies dizziness or presyncope. Walk the halls without difficulty this morning, sinus tachycardia was recorded during activity. Key examination changes: Resolving hematoma left periorbital area. Healthy implanted loop recorder site. Key new findings / data: Overnight telemetry shows recurrent  episodes of second-degree AV block Mobitz type I but with minimum heart rate only around 40 bpm and without any significant pauses. No evidence of high-grade  AV block. Narrow QRS throughout  PLAN: Replace potassium. I agree he should be on a potassium chloride supplement, 20 mEq daily. DC home today with wound check in 7 days. Wound care instructions and device follow-up discussed. Anticipate his device will record multiple episodes of irregular rhythm due to type I second-degree AV block, but pacemaker is only indicated if there are symptomatic pauses in excess of 2 seconds or if he has symptomatic significant bradycardia <40 beats per minute. So far we have only seen nocturnal episodes of second-degree AV block, which could well be physiological. Avoid any medications that could cause bradycardia  Sanda Klein, MD, [facility] [phone] 08/21/2017, 10:37 AM

## 2017-08-21 NOTE — Discharge Summary (Signed)
Discharge Summary    Patient ID: Joshua Burns,  MRN: [id number], DOB/AGE: [id number] 81 y.o.  Admit date: 08/18/2017 Discharge date: 08/21/2017  Primary Care Provider: Eulas Post Primary Cardiologist: Dr. Percival Spanish ([place])  Discharge Diagnoses    Active Problems:   Sinus pause   Fall   Syncope   Second degree Mobitz I AV block   Allergies Allergies  Allergen Reactions  . Ace Inhibitors Swelling  . Diltiazem Swelling and Palpitations    Diagnostic Studies/Procedures    2D Echo 08/20/17 Study Conclusions  - Left ventricle: The cavity size was normal. Systolic function was   normal. Wall motion was normal; there were no regional wall   motion abnormalities. Doppler parameters are consistent with   abnormal left ventricular relaxation (grade 1 diastolic   dysfunction). - Aortic valve: There was no regurgitation. - Mitral valve: There was mild regurgitation. - Left atrium: The atrium was normal in size. - Right ventricle: Systolic function was normal. - Right atrium: The atrium was normal in size. - Tricuspid valve: There was mild regurgitation. - Pulmonic valve: There was trivial regurgitation. - Pulmonary arteries: Systolic pressure was mildly increased. PA   peak pressure: 34 mm Hg (S). - Inferior vena cava: The vessel was normal in size. - Pericardium, extracardiac: There was no pericardial effusion.   History of Present Illness     [name], who presented to the ED on 08/18/17 after a syncopal episode. Pt was home alone. Event was unwitnessed, however he recalls the entire event. He was getting up from a chair and suddenly fell w/o warning. He denies any associated dizziness, lightheaded, palpitations, CP or dyspnea. No LOC. He sustained a left periorbital hematoma. Pt came to ED for evaluation.   Hospital Course     On arrival to ED, vital signs were stable. No hypotension. There was  mention of 6 second pauses on the monitor while in the ED, however there is no documentation of this. Pt was admitted to telemetry for further observation and w/u. Cardiac enzymes were negative. BMP with normal SCr and BUN. 2D echo with normal LVEF, wall motion and no significant valvular dysfunction. He had no recurrent symptoms. While under observation on telemetry, he has been noted to have second-degree AV block Mobitz type I with occasional pauses of 1-2 seconds and mild intermittent bradycardia, however no significant pauses. He is on donepezil for mild dementia, which could be contributing to his bradycardia, however plan is to continue this given it has had a beneficial effect on his cognitive function. Given there was no documented evidence of prolonged pauses, there was no clear evidence to support need for PPM at this time, however further monitoring was recommended. Subsequently, pt underwent placement of an implantable loop recorder on 08/20/17. If evidence of significant symptomatic bradycardia, pauses or high degree AVB, then we can later consider outpatient EP consultation for PPM.   Pt had no recurrent syncope nor symptomatic bradycardia. On 08/21/17, he was seen and examined by Dr. Sallyanne Kuster, who determined he was stable for discharge home. He was given wound care instructions. A wound check f/u has been arranged. His loop recorder will be followed in our device clinic.   Consultants: None     Discharge Vitals Blood pressure (!) 140/53, pulse 73, temperature 98.2 F (36.8 C), temperature source Oral, resp. rate 20, height 5\' 6"  (1.676 m), weight 134 lb 14.4 oz (61.2 kg), SpO2 100 %.  Filed Weights   08/19/17 1524 08/20/17 0615 08/21/17 0521  Weight: 138 lb 12.8 oz (63 kg) 135 lb 8 oz (61.5 kg) 134 lb 14.4 oz (61.2 kg)    Labs & Radiologic Studies    CBC  Recent Labs  08/19/17 0429 08/20/17 0805  WBC 4.9 4.3  NEUTROABS  --  2.5  HGB 8.2* 9.3*  HCT 25.7* 28.3*  MCV 87.4 86.8    PLT 49* 53*   Basic Metabolic Panel  Recent Labs  08/19/17 0429 08/20/17 0805  NA 139 140  K 3.4* 3.3*  CL 102 101  CO2 29 29  GLUCOSE 90 99  BUN 10 9  CREATININE 1.18 1.19  CALCIUM 9.0 9.4   Liver Function Tests  Recent Labs  08/20/17 0805  AST 16  ALT 7*  ALKPHOS 55  BILITOT 0.8  PROT 6.6  ALBUMIN 3.9   No results for input(s): LIPASE, AMYLASE in the last 72 hours. Cardiac Enzymes No results for input(s): CKTOTAL, CKMB, CKMBINDEX, TROPONINI in the last 72 hours. BNP Invalid input(s): POCBNP D-Dimer No results for input(s): DDIMER in the last 72 hours. Hemoglobin A1C No results for input(s): HGBA1C in the last 72 hours. Fasting Lipid Panel No results for input(s): CHOL, HDL, LDLCALC, TRIG, CHOLHDL, LDLDIRECT in the last 72 hours. Thyroid Function Tests No results for input(s): TSH, T4TOTAL, T3FREE, THYROIDAB in the last 72 hours.  Invalid input(s): FREET3 _____________  Ct Head Wo Contrast  Result Date: 08/18/2017 CLINICAL DATA:  Fall, does not remember events surrounding the fall. EXAM: CT HEAD WITHOUT CONTRAST CT CERVICAL SPINE WITHOUT CONTRAST TECHNIQUE: Multidetector CT imaging of the head and cervical spine was performed following the standard protocol without intravenous contrast. Multiplanar CT image reconstructions of the cervical spine were also generated. COMPARISON:  Head CT 09/16/2012 FINDINGS: CT HEAD FINDINGS Brain: Age related atrophy. Mild chronic small vessel ischemia. No intracranial hemorrhage, mass effect, or midline shift. No hydrocephalus. The basilar cisterns are patent. No evidence of territorial infarct or acute ischemia. No extra-axial or intracranial fluid collection. Vascular: Atherosclerosis of skullbase vasculature without hyperdense vessel or abnormal calcification. Skull: No skull fracture or focal lesion. Sinuses/Orbits: Paranasal sinuses and mastoid air cells are clear. The visualized orbits are unremarkable. Bilateral cataract  resection. Other: None. CT CERVICAL SPINE FINDINGS Alignment: Degenerative-type anterolisthesis of C7 on T1. Anterior fusion C4 through C7 with plate and screw fixation. No traumatic subluxation. Skull base and vertebrae: The dens and skull base are intact. Anterior C4 through C7 fusion with intact hardware. Solid bony fusion C4 through C5, incomplete bony fusion C6-C7. No acute fracture. Soft tissues and spinal canal: No prevertebral fluid or swelling. No visible canal hematoma. Disc levels: Anterior C4 through C7 fusion. Adjacent level degenerative disc disease and facet arthropathy. Upper chest: No acute abnormality.  Suspect mild emphysema. Other: Carotid atherosclerosis. IMPRESSION: 1.  No acute intracranial abnormality.  No skull fracture. 2. Degenerative and postsurgical change in the cervical spine without acute fracture or subluxation. Electronically Signed   By: Jeb Levering M.D.   On: 08/18/2017 03:22   Ct Cervical Spine Wo Contrast  Result Date: 08/18/2017 CLINICAL DATA:  Fall, does not remember events surrounding the fall. EXAM: CT HEAD WITHOUT CONTRAST CT CERVICAL SPINE WITHOUT CONTRAST TECHNIQUE: Multidetector CT imaging of the head and cervical spine was performed following the standard protocol without intravenous contrast. Multiplanar CT image reconstructions of the cervical spine were also generated. COMPARISON:  Head CT 09/16/2012 FINDINGS: CT HEAD FINDINGS Brain: Age related  atrophy. Mild chronic small vessel ischemia. No intracranial hemorrhage, mass effect, or midline shift. No hydrocephalus. The basilar cisterns are patent. No evidence of territorial infarct or acute ischemia. No extra-axial or intracranial fluid collection. Vascular: Atherosclerosis of skullbase vasculature without hyperdense vessel or abnormal calcification. Skull: No skull fracture or focal lesion. Sinuses/Orbits: Paranasal sinuses and mastoid air cells are clear. The visualized orbits are unremarkable. Bilateral  cataract resection. Other: None. CT CERVICAL SPINE FINDINGS Alignment: Degenerative-type anterolisthesis of C7 on T1. Anterior fusion C4 through C7 with plate and screw fixation. No traumatic subluxation. Skull base and vertebrae: The dens and skull base are intact. Anterior C4 through C7 fusion with intact hardware. Solid bony fusion C4 through C5, incomplete bony fusion C6-C7. No acute fracture. Soft tissues and spinal canal: No prevertebral fluid or swelling. No visible canal hematoma. Disc levels: Anterior C4 through C7 fusion. Adjacent level degenerative disc disease and facet arthropathy. Upper chest: No acute abnormality.  Suspect mild emphysema. Other: Carotid atherosclerosis. IMPRESSION: 1.  No acute intracranial abnormality.  No skull fracture. 2. Degenerative and postsurgical change in the cervical spine without acute fracture or subluxation. Electronically Signed   By: Jeb Levering M.D.   On: 08/18/2017 03:22   Dg Chest Portable 1 View  Result Date: 08/18/2017 CLINICAL DATA:  Fall. EXAM: PORTABLE CHEST 1 VIEW COMPARISON:  09/02/2015 FINDINGS: The heart is enlarged. Atherosclerosis of the aortic arch. No pulmonary edema. No focal airspace opacity. No pneumothorax, multiple skin folds project over the left chest. No large pleural effusion. No evidence of acute osseous abnormality. IMPRESSION: Cardiomegaly with thoracic aortic atherosclerosis. No acute or evidence of traumatic abnormality. Electronically Signed   By: Jeb Levering M.D.   On: 08/18/2017 02:58   Disposition   Pt is being discharged home today in good condition.  Follow-up Plans & Appointments    Follow-up Information    [facility] Follow up on 09/03/2017.   Specialty:  Cardiology Why:  2:30 PM for wound check  Contact information: [address] [place] [phone]         Discharge Instructions    Diet - low sodium heart healthy    Complete by:  As  directed    Increase activity slowly    Complete by:  As directed       Discharge Medications   Discharge Medication List as of 08/21/2017 11:09 AM    START taking these medications   Details  potassium chloride SA (K-DUR,KLOR-CON) 20 MEQ tablet Take 1 tablet (20 mEq total) by mouth daily., Starting Wed 08/22/2017, Normal      CONTINUE these medications which have CHANGED   Details  aspirin EC 81 MG tablet Take 1 tablet (81 mg total) by mouth every morning., Starting Tue 08/21/2017, No Print      CONTINUE these medications which have NOT CHANGED   Details  donepezil (ARICEPT) 10 MG tablet Take 1 tablet (10 mg total) by mouth at bedtime., Starting Tue 12/05/2016, Normal    doxazosin (CARDURA) 4 MG tablet TAKE 1 TABLET BY MOUTH AT BEDTIME, Normal    hydrochlorothiazide (HYDRODIURIL) 12.5 MG tablet TAKE 1 TABLET EVERY DAY, Normal    simvastatin (ZOCOR) 20 MG tablet TAKE 1 TABLET BY MOUTH EVERY DAY AT BEDTIME, Normal          Duration of Discharge Encounter   Greater than 30 minutes including physician time.  Signed, Lyda Jester PA-C 08/21/2017, 1:38 PM

## 2017-08-22 ENCOUNTER — Encounter: Payer: Self-pay | Admitting: Oncology

## 2017-08-22 ENCOUNTER — Other Ambulatory Visit: Payer: Self-pay | Admitting: Oncology

## 2017-08-23 ENCOUNTER — Telehealth: Payer: Self-pay | Admitting: *Deleted

## 2017-08-23 NOTE — Telephone Encounter (Signed)
Spoke with patient and son.  Requested a manual Carelink transmission for review of any "AF" and tachy episodes that have not transmitted automatically.  Available ECGs appear sinus tach w/PACs and PVCs.  Assisted patient's son with sending a manual transmission.  Carelink website is down at this time so unable to view this afternoon.  Advised patient's son that we will call him after transmission is received if any new findings.  Educated patient's son about use of symptom activator for syncopal episodes.  Patient's son is appreciative and denies additional questions or concerns at this time.

## 2017-08-29 NOTE — Telephone Encounter (Signed)
Spoke with patient regarding manual transmission received. Reviewed AF and tachy episodes, ECGs appears ST/SVT with PACs and PVCs. 1 pause episode 08/28/2017 at 0642, duration 4 seconds. Patient states no symptoms of syncope or presyncope. Advised patient will place in Dr. Harlene Salts folder for review. Patient was appreciative and denies additional questions or concerns at this time.

## 2017-09-03 ENCOUNTER — Ambulatory Visit (INDEPENDENT_AMBULATORY_CARE_PROVIDER_SITE_OTHER): Payer: Self-pay | Admitting: *Deleted

## 2017-09-03 DIAGNOSIS — R55 Syncope and collapse: Secondary | ICD-10-CM

## 2017-09-03 LAB — CUP PACEART INCLINIC DEVICE CHECK
Implantable Pulse Generator Implant Date: 20180827
MDC IDC SESS DTM: 20180910165701

## 2017-09-03 NOTE — Progress Notes (Signed)
Wound check in clinic s/p ILR implant. Steri strips removed. Wound well healed without redness or edema. Normal ILR device function. Battery status: Good. R-waves 0.80mV. 0 symptom episodes, 4 tachy episodes--ST w/PACs, PVCs, (1) pause episode x 4sec-nocturnal, (18) brady episodes--max dur. 24sec-all nocturnal. (6) "AF" episodes (2.0% burden)--ST w/PACs, PVCs. Monthly summary reports and ROV with [facility] in 3 months. Patient education completed including wound care and remote monitoring.

## 2017-09-05 ENCOUNTER — Other Ambulatory Visit: Payer: Self-pay | Admitting: Family Medicine

## 2017-09-07 ENCOUNTER — Telehealth: Payer: Self-pay | Admitting: *Deleted

## 2017-09-07 NOTE — Telephone Encounter (Signed)
Spoke with patient's son, assisted him with sending a manual transmission for review.  Manual transmission received.  All available pause and brady episodes are appropriate and nocturnal.  No new symptom episodes, no recent dizziness or syncope per son.  Advised we will continue to monitor his ILR remotely and patient's son is agreeable.

## 2017-09-07 NOTE — Telephone Encounter (Signed)
Spoke with patient's son, Legrand Como, regarding Carelink alert received for pause and brady episodes.  2 pause episodes occurred on 9/11 and 9/13, at 0500 and 0701, respectively.  Per Legrand Como, patient usually sleeps until 9:30-10:00am.  No brady ECGs available, but 19 episodes noted by device.  Requested manual transmission from Legrand Como, who requests a call back later this morning as patient is just getting up now.

## 2017-09-13 ENCOUNTER — Encounter: Payer: Self-pay | Admitting: Family Medicine

## 2017-09-14 ENCOUNTER — Telehealth: Payer: Self-pay | Admitting: *Deleted

## 2017-09-14 NOTE — Telephone Encounter (Signed)
Spoke with patient regarding sending manual transmission and symptoms associated with 9 pause episodes and 7 brady episodes. Patient states asymptomatic with episodes. Manual transmission sent successfully. 9 pause episodes, longest 3 seconds; 8 brady episodes, longest 23 seconds, minimum V rate <30 bpm.   Reminded Patient about follow-up with Dr. Sallyanne Kuster 12/05/17. Patient verbalized understanding and appreciation.   Will place in Dr. Harlene Salts folder for review.

## 2017-09-19 ENCOUNTER — Ambulatory Visit (INDEPENDENT_AMBULATORY_CARE_PROVIDER_SITE_OTHER): Payer: Medicare Other | Admitting: *Deleted

## 2017-09-19 DIAGNOSIS — R55 Syncope and collapse: Secondary | ICD-10-CM | POA: Diagnosis not present

## 2017-09-19 NOTE — Progress Notes (Signed)
Carelink Summary Report / Loop Recorder 

## 2017-09-21 LAB — CUP PACEART REMOTE DEVICE CHECK
Implantable Pulse Generator Implant Date: 20180827
MDC IDC SESS DTM: 20180926144523

## 2017-09-21 NOTE — Addendum Note (Signed)
Addended by: Milderd Meager on: 09/21/2017 02:42 PM   Modules accepted: Orders

## 2017-09-24 ENCOUNTER — Other Ambulatory Visit: Payer: Self-pay

## 2017-10-01 DIAGNOSIS — Z23 Encounter for immunization: Secondary | ICD-10-CM | POA: Diagnosis not present

## 2017-10-02 ENCOUNTER — Telehealth: Payer: Self-pay | Admitting: *Deleted

## 2017-10-02 NOTE — Telephone Encounter (Signed)
Spoke with patient regarding pause and brady episodes noted on LINQ.  Dates/times appear nocturnal for available episodes, but requested manual transmission from patient to review all episodes.    Transmission successfully received. 33 brady and 3 pause episodes since 09/14/17, all nocturnal and asymptomatic per patient.  He is aware to call in the future if he has any symptoms of presyncope/syncope and is aware of his upcoming appointment with Dr. Sallyanne Kuster on 10/23/17 at 10:00am.  Patient denies questions or concerns at this time.  ECGs printed and placed in Dr. Langley Gauss folder for review.

## 2017-10-08 ENCOUNTER — Telehealth: Payer: Self-pay | Admitting: *Deleted

## 2017-10-08 NOTE — Telephone Encounter (Signed)
Agree. Will discuss at his appt in a couple of weeks. MCr

## 2017-10-08 NOTE — Telephone Encounter (Signed)
Received manual transmission from patient's home monitor on 10/07/17 at 2253 per timestamp in [place].  Transmission indicates 4 tachy episodes, 43 brady episodes, and 2 "AF" episodes.  Patient reports he may have sent the manual transmission accidentally.  He denies any symptoms of palpitations, presyncope, or syncope.    "AF" episodes appear sinus tach with PACs and PVCs.  Brady episodes are appropriate, but likely nocturnal as patient's son previously reported that he usually sleeps until 0930-1000.  Tachy episodes are appropriate, appear possible A-fib, patient asymptomatic.  Reviewed with Dr. Rayann Heman, who agreed and recommended review by Dr. Sallyanne Kuster when he returns to the office.  Example of tachy episode included in this note, remaining ECGs placed in Dr. Langley Gauss folder for review upon his return.

## 2017-10-19 ENCOUNTER — Ambulatory Visit (INDEPENDENT_AMBULATORY_CARE_PROVIDER_SITE_OTHER): Payer: Medicare Other | Admitting: *Deleted

## 2017-10-19 DIAGNOSIS — R55 Syncope and collapse: Secondary | ICD-10-CM

## 2017-10-19 NOTE — Progress Notes (Signed)
Carelink Summary Report / Loop Recorder 

## 2017-10-23 ENCOUNTER — Encounter: Payer: Medicare Other | Admitting: Cardiovascular Disease

## 2017-10-24 ENCOUNTER — Encounter: Payer: Medicare Other | Admitting: Cardiovascular Disease

## 2017-10-25 LAB — CUP PACEART REMOTE DEVICE CHECK
Date Time Interrogation Session: 20181026153616
MDC IDC PG IMPLANT DT: 20180827

## 2017-10-31 ENCOUNTER — Ambulatory Visit (INDEPENDENT_AMBULATORY_CARE_PROVIDER_SITE_OTHER): Payer: Medicare Other | Admitting: Cardiovascular Disease

## 2017-10-31 ENCOUNTER — Encounter: Payer: Self-pay | Admitting: Cardiovascular Disease

## 2017-10-31 VITALS — BP 160/60 | HR 57 | Ht 66.0 in | Wt 141.0 lb

## 2017-10-31 DIAGNOSIS — I480 Paroxysmal atrial fibrillation: Secondary | ICD-10-CM | POA: Diagnosis not present

## 2017-10-31 DIAGNOSIS — F0390 Unspecified dementia without behavioral disturbance: Secondary | ICD-10-CM

## 2017-10-31 DIAGNOSIS — Z4509 Encounter for adjustment and management of other cardiac device: Secondary | ICD-10-CM

## 2017-10-31 DIAGNOSIS — R55 Syncope and collapse: Secondary | ICD-10-CM

## 2017-10-31 DIAGNOSIS — Z01812 Encounter for preprocedural laboratory examination: Secondary | ICD-10-CM | POA: Diagnosis not present

## 2017-10-31 DIAGNOSIS — I495 Sick sinus syndrome: Secondary | ICD-10-CM | POA: Diagnosis not present

## 2017-10-31 MED ORDER — APIXABAN 5 MG PO TABS: 5.0000 mg | ORAL_TABLET | Freq: Two times a day (BID) | ORAL | 5 refills | Status: DC

## 2017-10-31 NOTE — H&P (View-Only) (Signed)
Cardiology Office Note:    Date:  11/02/2017   ID:  Joshua Burns, DOB 11-16-1925, MRN [id number]  PCP:  Eulas Post, MD  Cardiologist:  Sanda Klein, MD    Referring MD: Eulas Post, MD   chief complaint: Arrhythmia on loop recorder  History of Present Illness:    Joshua Burns is a 83 y.o. male with a hx of syncope without premonitory symptoms.  An implantable loop recorder was placed in August.  Since then it has shown several episodes of sinus pause lasting between 3 and 4 seconds.  These pauses have been recorded even after we discontinued Aricept. More recently, the device has also recorded paroxysmal atrial fibrillation with rapid ventricular response with ventricular rates that frequently reached the 150s he has never had stroke/TIA or other embolic events.  He has a steady gait and has not had recent falls other than those associated with syncope.  He denies chest pain, dyspnea, claudication, leg edema or interim focal neurological complaints.  He has treated hypertension and hyperlipidemia.  He has normal left ventricular systolic function and had a normal stress test in the remote past.  He denies leg edema or intermittent claudication.  He does have cognitive issues, but is pretty well compensated even after we discontinued his donepezil.  Past Medical History:  Diagnosis Date  . Arthritis   . CAD (coronary artery disease)   . Glaucoma   . HYPERLIPIDEMIA 01/28/2010  . HYPERTENSION 01/28/2010  . HYPERTROPHY PROSTATE W/UR OBST & OTH LUTS 01/28/2010    Past Surgical History:  Procedure Laterality Date  . ANGIOPLASTY  2004  . CARDIOVASCULAR STRESS TEST  08-04-2008   EF 0%  . [place] SURGERY  2004  . HERNIA REPAIR  2006  . PROSTATE BIOPSY  03/2008  . PROSTATE SURGERY  2009   bx  . US ECHOCARDIOGRAPHY  08-04-2008   Est EF 55-60%    Current Medications: Current Meds  Medication Sig  . aspirin EC 81 MG tablet Take 1 tablet (81 mg total) by mouth every  morning.  Marland Kitchen doxazosin (CARDURA) 4 MG tablet TAKE 1 TABLET BY MOUTH AT BEDTIME  . hydrochlorothiazide (HYDRODIURIL) 12.5 MG tablet TAKE 1 TABLET EVERY DAY  . potassium chloride SA (K-DUR,KLOR-CON) 20 MEQ tablet Take 1 tablet (20 mEq total) by mouth daily.  . simvastatin (ZOCOR) 20 MG tablet TAKE 1 TABLET BY MOUTH EVERY DAY AT BEDTIME     Allergies:   Ace inhibitors and Diltiazem   Social History   Socioeconomic History  . Marital status: Married    Spouse name: None  . Number of children: 3  . Years of education: None  . Highest education level: None  Social Needs  . Financial resource strain: None  . Food insecurity - worry: None  . Food insecurity - inability: None  . Transportation needs - medical: None  . Transportation needs - non-medical: None  Occupational History    Employer: [employer]  Tobacco Use  . Smoking status: Former Smoker    Last attempt to quit: 12/25/1978    Years since quitting: 38.8  . Smokeless tobacco: Never Used  Substance and Sexual Activity  . Alcohol use: No  . Drug use: No  . Sexual activity: None  Other Topics Concern  . None  Social History Narrative  . None     Family History: The patient's family history includes Aneurysm in his brother; Heart disease in his father and mother. ROS:   Please  see the history of present illness.     All other systems reviewed and are negative.  EKGs/Labs/Other Studies Reviewed:    The following studies were reviewed today: Loop recorder downloads  EKG:  EKG is ordered today.  The ekg ordered today demonstrates sinus bradycardia with PVC and no repolarization abnormalities.  QTc 399 ms  Recent Labs: 08/18/2017: TSH 1.276 08/20/2017: ALT 7; BUN 9; Creatinine, Ser 1.19; Hemoglobin 9.3; Platelets 53; Potassium 3.3; Sodium 140  Recent Lipid Panel    Component Value Date/Time   CHOL 107 08/18/2017 0842   TRIG 39 08/18/2017 0842   HDL 44 08/18/2017 0842   CHOLHDL 2.4 08/18/2017 0842   VLDL 8  08/18/2017 0842   LDLCALC 55 08/18/2017 0842    Physical Exam:    VS:  BP (!) 160/60   Pulse (!) 57   Ht 5\' 6"  (1.676 m)   Wt 141 lb (64 kg)   BMI 22.76 kg/m     Wt Readings from Last 3 Encounters:  10/31/17 141 lb (64 kg)  08/21/17 134 lb 14.4 oz (61.2 kg)  02/16/17 146 lb (66.2 kg)     GEN:  Well nourished, well developed in no acute distress HEENT: Normal NECK: No JVD; No carotid bruits LYMPHATICS: No lymphadenopathy CARDIAC: RRR, no murmurs, rubs, gallops RESPIRATORY:  Clear to auscultation without rales, wheezing or rhonchi  ABDOMEN: Soft, non-tender, non-distended MUSCULOSKELETAL:  No edema; No deformity  SKIN: Warm and dry NEUROLOGIC:  Alert and oriented x 3 PSYCHIATRIC:  Normal affect   ASSESSMENT:    1. SSS (sick sinus syndrome) (HCC)   2. Paroxysmal atrial fibrillation ([place])   3. Dementia without behavioral disturbance, unspecified dementia type   4. Encounter for loop recorder check   5. Syncope, unspecified syncope type   6. Pre-procedure lab exam    PLAN:    In order of problems listed above:  1. SSS: [name] is clear evidence of sinus bradycardia and sinus pauses and a history of recent syncope.  Bradycardia and pauses have been seen even after we stopped all medications with negative chronotropic effect.  I think she will benefit from implantation of a dual-chamber permanent pacemaker.  His family was present in the room during discussion of pros and cons of pacemaker therapy, details of device implantation and postop care, possible complications and long-term follow-up. This procedure has been fully reviewed with the patient and written informed consent has been obtained. 2. PAFib: The arrhythmia is asymptomatic.  Obviously we cannot give him any rate control medications until his pacemaker has been implanted.  Since I believe he is also still at risk for falls and injury, I would like to delay initiation of anticoagulants until after we implant the  pacemaker, which should be within the next couple of weeks. CHADSVasc 3 (age 59, HTN). 3. Dementia: After his pacemaker is implanted we can restart his donepezil. 4. ILR: Plan to explant this at the time of pacemaker implantation   Medication Adjustments/Labs and Tests Ordered: Current medicines are reviewed at length with the patient today.  Concerns regarding medicines are outlined above.  Orders Placed This Encounter  Procedures  . Basic metabolic panel  . CBC  . Protime-INR  . EKG 12-Lead   Meds ordered this encounter  Medications  . apixaban (ELIQUIS) 5 MG TABS tablet    Sig: Take 1 tablet (5 mg total) 2 (two) times daily by mouth.    Dispense:  60 tablet    Refill:  5  Signed, Sanda Klein, MD  11/02/2017 3:50 PM    [place]

## 2017-10-31 NOTE — Progress Notes (Signed)
Cardiology Office Note:    Date:  11/02/2017   ID:  Joshua Burns, DOB 1925/05/27, MRN [id number]  PCP:  Joshua Post, MD  Cardiologist:  Joshua Klein, MD    Referring MD: Joshua Post, MD   chief complaint: Arrhythmia on loop recorder  History of Present Illness:    Joshua Burns is a 83 y.o. male with a hx of syncope without premonitory symptoms.  An implantable loop recorder was placed in August.  Since then it has shown several episodes of sinus pause lasting between 3 and 4 seconds.  These pauses have been recorded even after we discontinued Aricept. More recently, the device has also recorded paroxysmal atrial fibrillation with rapid ventricular response with ventricular rates that frequently reached the 150s he has never had stroke/TIA or other embolic events.  He has a steady gait and has not had recent falls other than those associated with syncope.  He denies chest pain, dyspnea, claudication, leg edema or interim focal neurological complaints.  He has treated hypertension and hyperlipidemia.  He has normal left ventricular systolic function and had a normal stress test in the remote past.  He denies leg edema or intermittent claudication.  He does have cognitive issues, but is pretty well compensated even after we discontinued his donepezil.  Past Medical History:  Diagnosis Date  . Arthritis   . CAD (coronary artery disease)   . Glaucoma   . HYPERLIPIDEMIA 01/28/2010  . HYPERTENSION 01/28/2010  . HYPERTROPHY PROSTATE W/UR OBST & OTH LUTS 01/28/2010    Past Surgical History:  Procedure Laterality Date  . ANGIOPLASTY  2004  . CARDIOVASCULAR STRESS TEST  08-04-2008   EF 0%  . [place] SURGERY  2004  . HERNIA REPAIR  2006  . PROSTATE BIOPSY  03/2008  . PROSTATE SURGERY  2009   bx  . US ECHOCARDIOGRAPHY  08-04-2008   Est EF 55-60%    Current Medications: Current Meds  Medication Sig  . aspirin EC 81 MG tablet Take 1 tablet (81 mg total) by mouth every  morning.  Marland Kitchen doxazosin (CARDURA) 4 MG tablet TAKE 1 TABLET BY MOUTH AT BEDTIME  . hydrochlorothiazide (HYDRODIURIL) 12.5 MG tablet TAKE 1 TABLET EVERY DAY  . potassium chloride SA (K-DUR,KLOR-CON) 20 MEQ tablet Take 1 tablet (20 mEq total) by mouth daily.  . simvastatin (ZOCOR) 20 MG tablet TAKE 1 TABLET BY MOUTH EVERY DAY AT BEDTIME     Allergies:   Ace inhibitors and Diltiazem   Social History   Socioeconomic History  . Marital status: Married    Spouse name: None  . Number of children: 3  . Years of education: None  . Highest education level: None  Social Needs  . Financial resource strain: None  . Food insecurity - worry: None  . Food insecurity - inability: None  . Transportation needs - medical: None  . Transportation needs - non-medical: None  Occupational History    Employer: [employer]  Tobacco Use  . Smoking status: Former Smoker    Last attempt to quit: 12/25/1978    Years since quitting: 38.8  . Smokeless tobacco: Never Used  Substance and Sexual Activity  . Alcohol use: No  . Drug use: No  . Sexual activity: None  Other Topics Concern  . None  Social History Narrative  . None     Family History: The patient's family history includes Aneurysm in his brother; Heart disease in his father and mother. ROS:   Please  see the history of present illness.     All other systems reviewed and are negative.  EKGs/Labs/Other Studies Reviewed:    The following studies were reviewed today: Loop recorder downloads  EKG:  EKG is ordered today.  The ekg ordered today demonstrates sinus bradycardia with PVC and no repolarization abnormalities.  QTc 399 ms  Recent Labs: 08/18/2017: TSH 1.276 08/20/2017: ALT 7; BUN 9; Creatinine, Ser 1.19; Hemoglobin 9.3; Platelets 53; Potassium 3.3; Sodium 140  Recent Lipid Panel    Component Value Date/Time   CHOL 107 08/18/2017 0842   TRIG 39 08/18/2017 0842   HDL 44 08/18/2017 0842   CHOLHDL 2.4 08/18/2017 0842   VLDL 8  08/18/2017 0842   LDLCALC 55 08/18/2017 0842    Physical Exam:    VS:  BP (!) 160/60   Pulse (!) 57   Ht 5\' 6"  (1.676 m)   Wt 141 lb (64 kg)   BMI 22.76 kg/m     Wt Readings from Last 3 Encounters:  10/31/17 141 lb (64 kg)  08/21/17 134 lb 14.4 oz (61.2 kg)  02/16/17 146 lb (66.2 kg)     GEN:  Well nourished, well developed in no acute distress HEENT: Normal NECK: No JVD; No carotid bruits LYMPHATICS: No lymphadenopathy CARDIAC: RRR, no murmurs, rubs, gallops RESPIRATORY:  Clear to auscultation without rales, wheezing or rhonchi  ABDOMEN: Soft, non-tender, non-distended MUSCULOSKELETAL:  No edema; No deformity  SKIN: Warm and dry NEUROLOGIC:  Alert and oriented x 3 PSYCHIATRIC:  Normal affect   ASSESSMENT:    1. SSS (sick sinus syndrome) (HCC)   2. Paroxysmal atrial fibrillation ([place])   3. Dementia without behavioral disturbance, unspecified dementia type   4. Encounter for loop recorder check   5. Syncope, unspecified syncope type   6. Pre-procedure lab exam    PLAN:    In order of problems listed above:  1. SSS: [name] is clear evidence of sinus bradycardia and sinus pauses and a history of recent syncope.  Bradycardia and pauses have been seen even after we stopped all medications with negative chronotropic effect.  I think she will benefit from implantation of a dual-chamber permanent pacemaker.  His family was present in the room during discussion of pros and cons of pacemaker therapy, details of device implantation and postop care, possible complications and long-term follow-up. This procedure has been fully reviewed with the patient and written informed consent has been obtained. 2. PAFib: The arrhythmia is asymptomatic.  Obviously we cannot give him any rate control medications until his pacemaker has been implanted.  Since I believe he is also still at risk for falls and injury, I would like to delay initiation of anticoagulants until after we implant the  pacemaker, which should be within the next couple of weeks. CHADSVasc 3 (age 53, HTN). 3. Dementia: After his pacemaker is implanted we can restart his donepezil. 4. ILR: Plan to explant this at the time of pacemaker implantation   Medication Adjustments/Labs and Tests Ordered: Current medicines are reviewed at length with the patient today.  Concerns regarding medicines are outlined above.  Orders Placed This Encounter  Procedures  . Basic metabolic panel  . CBC  . Protime-INR  . EKG 12-Lead   Meds ordered this encounter  Medications  . apixaban (ELIQUIS) 5 MG TABS tablet    Sig: Take 1 tablet (5 mg total) 2 (two) times daily by mouth.    Dispense:  60 tablet    Refill:  5  Signed, Joshua Klein, MD  11/02/2017 3:50 PM    [place]

## 2017-10-31 NOTE — Patient Instructions (Signed)
Dr Sallyanne Kuster has recommended making the following medication changes: 1. START Eliquis 5 mg - take 1 tablet by mouth twice daily (as close to 12 hours apart as possible)  Pacemaker Implantation, Adult Pacemaker implantation is a procedure to place a pacemaker inside your chest. A pacemaker is a small computer that sends electrical signals to the heart and helps your heart beat normally. A pacemaker also stores information about your heart rhythms. You may need pacemaker implantation if you:  Have a slow heartbeat (bradycardia).  Faint (syncope).  Have shortness of breath (dyspnea) due to heart problems.  The pacemaker attaches to your heart through a wire, called a lead. Sometimes just one lead is needed. Other times, there will be two leads. There are two types of pacemakers:  Transvenous pacemaker. This type is placed under the skin or muscle of your chest. The lead goes through a vein in the chest area to reach the inside of the heart.  Epicardial pacemaker. This type is placed under the skin or muscle of your chest or belly. The lead goes through your chest to the outside of the heart.  Tell a health care provider about:  Any allergies you have.  All medicines you are taking, including vitamins, herbs, eye drops, creams, and over-the-counter medicines.  Any problems you or family members have had with anesthetic medicines.  Any blood or bone disorders you have.  Any surgeries you have had.  Any medical conditions you have.  Whether you are pregnant or may be pregnant. What are the risks? Generally, this is a safe procedure. However, problems may occur, including:  Infection.  Bleeding.  Failure of the pacemaker or the lead.  Collapse of a lung or bleeding into a lung.  Blood clot inside a blood vessel with a lead.  Damage to the heart.  Infection inside the heart (endocarditis).  Allergic reactions to medicines.  What happens before the procedure? Staying  hydrated Follow instructions from your health care provider about hydration, which may include:  Up to 2 hours before the procedure - you may continue to drink clear liquids, such as water, clear fruit juice, black coffee, and plain tea.  Eating and drinking restrictions Follow instructions from your health care provider about eating and drinking, which may include:  8 hours before the procedure - stop eating heavy meals or foods such as meat, fried foods, or fatty foods.  6 hours before the procedure - stop eating light meals or foods, such as toast or cereal.  6 hours before the procedure - stop drinking milk or drinks that contain milk.  2 hours before the procedure - stop drinking clear liquids.  Medicines  Ask your health care provider about: ? Changing or stopping your regular medicines. This is especially important if you are taking diabetes medicines or blood thinners. ? Taking medicines such as aspirin and ibuprofen. These medicines can thin your blood. Do not take these medicines before your procedure if your health care provider instructs you not to.  You may be given antibiotic medicine to help prevent infection. General instructions  You will have a heart evaluation. This may include an electrocardiogram (ECG), chest X-ray, and heart imaging (echocardiogram,  or echo) tests.  You will have blood tests.  Do not use any products that contain nicotine or tobacco, such as cigarettes and e-cigarettes. If you need help quitting, ask your health care provider.  Plan to have someone take you home from the hospital or clinic.  If you  will be going home right after the procedure, plan to have someone with you for 24 hours.  Ask your health care provider how your surgical site will be marked or identified. What happens during the procedure?  To reduce your risk of infection: ? Your health care team will wash or sanitize their hands. ? Your skin will be washed with  soap. ? Hair may be removed from the surgical area.  An IV tube will be inserted into one of your veins.  You will be given one or more of the following: ? A medicine to help you relax (sedative). ? A medicine to numb the area (local anesthetic). ? A medicine to make you fall asleep (general anesthetic).  If you are getting a transvenous pacemaker: ? An incision will be made in your upper chest. ? A pocket will be made for the pacemaker. It may be placed under the skin or between layers of muscle. ? The lead will be inserted into a blood vessel that returns to the heart. ? While X-rays are taken by an imaging machine (fluoroscopy), the lead will be advanced through the vein to the inside of your heart. ? The other end of the lead will be tunneled under the skin and attached to the pacemaker.  If you are getting an epicardial pacemaker: ? An incision will be made near your ribs or breastbone (sternum) for the lead. ? The lead will be attached to the outside of your heart. ? Another incision will be made in your chest or upper belly to create a pocket for the pacemaker. ? The free end of the lead will be tunneled under the skin and attached to the pacemaker.  The transvenous or epicardial pacemaker will be tested. Imaging studies may be done to check the lead position.  The incisions will be closed with stitches (sutures), adhesive strips, or skin glue.  Bandages (dressing) will be placed over the incisions. The procedure may vary among health care providers and hospitals. What happens after the procedure?  Your blood pressure, heart rate, breathing rate, and blood oxygen level will be monitored until the medicines you were given have worn off.  You will be given antibiotics and pain medicine.  ECG and chest x-rays will be done.  You will wear a continuous type of ECG (Holter monitor) to check your heart rhythm.  Your health care provider willprogram the pacemaker.  Do not  drive for 24 hours if you received a sedative. This information is not intended to replace advice given to you by your health care provider. Make sure you discuss any questions you have with your health care provider. Document Released: 12/01/2002 Document Revised: 06/30/2016 Document Reviewed: 05/24/2016 Elsevier Interactive Patient Education  [employer].

## 2017-11-02 ENCOUNTER — Encounter: Payer: Medicare Other | Admitting: Cardiovascular Disease

## 2017-11-02 ENCOUNTER — Encounter: Payer: Self-pay | Admitting: Cardiovascular Disease

## 2017-11-02 DIAGNOSIS — I480 Paroxysmal atrial fibrillation: Secondary | ICD-10-CM

## 2017-11-02 HISTORY — DX: Paroxysmal atrial fibrillation: I48.0

## 2017-11-06 LAB — CUP PACEART INCLINIC DEVICE CHECK
Implantable Pulse Generator Implant Date: 20180827
MDC IDC SESS DTM: 20181113144035

## 2017-11-15 ENCOUNTER — Other Ambulatory Visit: Payer: Self-pay | Admitting: Family Medicine

## 2017-11-19 ENCOUNTER — Ambulatory Visit (INDEPENDENT_AMBULATORY_CARE_PROVIDER_SITE_OTHER): Payer: Medicare Other | Admitting: *Deleted

## 2017-11-19 DIAGNOSIS — R55 Syncope and collapse: Secondary | ICD-10-CM

## 2017-11-19 NOTE — Progress Notes (Signed)
Carelink Summary Report / Loop Recorder 

## 2017-11-20 DIAGNOSIS — Z01812 Encounter for preprocedural laboratory examination: Secondary | ICD-10-CM | POA: Diagnosis not present

## 2017-11-20 DIAGNOSIS — I455 Other specified heart block: Secondary | ICD-10-CM | POA: Diagnosis not present

## 2017-11-20 DIAGNOSIS — R55 Syncope and collapse: Secondary | ICD-10-CM | POA: Diagnosis not present

## 2017-11-21 LAB — BASIC METABOLIC PANEL
BUN/Creatinine Ratio: 8 — ABNORMAL LOW (ref 10–24)
BUN: 10 mg/dL (ref 10–36)
CHLORIDE: 101 mmol/L (ref 96–106)
CO2: 27 mmol/L (ref 20–29)
CREATININE: 1.27 mg/dL (ref 0.76–1.27)
Calcium: 9.4 mg/dL (ref 8.6–10.2)
GFR calc Af Amer: 57 mL/min/{1.73_m2} — ABNORMAL LOW (ref >59)
GFR calc non Af Amer: 49 mL/min/{1.73_m2} — ABNORMAL LOW (ref >59)
GLUCOSE: 109 mg/dL — AB (ref 65–99)
POTASSIUM: 4.1 mmol/L (ref 3.5–5.2)
SODIUM: 143 mmol/L (ref 134–144)

## 2017-11-21 LAB — CBC
HEMOGLOBIN: 9.8 g/dL — AB (ref 13.0–17.7)
Hematocrit: 30.3 % — ABNORMAL LOW (ref 37.5–51.0)
MCH: 27.8 pg (ref 26.6–33.0)
MCHC: 32.3 g/dL (ref 31.5–35.7)
MCV: 86 fL (ref 79–97)
PLATELETS: 71 10*3/uL — AB (ref 150–379)
RBC: 3.52 x10E6/uL — ABNORMAL LOW (ref 4.14–5.80)
RDW: 16.3 % — AB (ref 12.3–15.4)
WBC: 3.1 10*3/uL — AB (ref 3.4–10.8)

## 2017-11-22 ENCOUNTER — Telehealth: Payer: Self-pay | Admitting: *Deleted

## 2017-11-22 NOTE — Telephone Encounter (Signed)
Copied from [place] [id number]. Topic: General - Other >> Nov 22, 2017 12:32 PM Valla Leaver wrote: Reason for CRM: Patient's son Joshua Burns needs to know what he is taking for dementia?

## 2017-11-22 NOTE — Telephone Encounter (Signed)
Patient's son, Elta Guadeloupe, is on [place].

## 2017-11-23 MED ORDER — DONEPEZIL HCL 10 MG PO TABS: 10.0000 mg | ORAL_TABLET | Freq: Every day | ORAL | 0 refills | Status: DC

## 2017-11-23 NOTE — Telephone Encounter (Signed)
Spoke with son and reviewed med list.  Refill sent.  Appointment made for CPE.

## 2017-11-27 ENCOUNTER — Other Ambulatory Visit: Payer: Self-pay | Admitting: Cardiovascular Disease

## 2017-11-27 DIAGNOSIS — I495 Sick sinus syndrome: Secondary | ICD-10-CM

## 2017-11-28 ENCOUNTER — Other Ambulatory Visit: Payer: Self-pay

## 2017-11-28 ENCOUNTER — Encounter (HOSPITAL_COMMUNITY): Payer: Self-pay | Admitting: General Practice

## 2017-11-28 ENCOUNTER — Encounter (HOSPITAL_COMMUNITY)
Admission: RE | Disposition: A | Discharge status: Home / Self Care | Payer: Self-pay | Source: Ambulatory Visit | Attending: Cardiovascular Disease

## 2017-11-28 ENCOUNTER — Ambulatory Visit (HOSPITAL_COMMUNITY): Payer: Medicare Other

## 2017-11-28 ENCOUNTER — Ambulatory Visit (HOSPITAL_COMMUNITY)
Admission: RE | Admit: 2017-11-28 | Discharge: 2017-11-29 | Disposition: A | Discharge status: Home / Self Care | Payer: Medicare Other | Source: Ambulatory Visit | Attending: Cardiovascular Disease | Admitting: Cardiovascular Disease

## 2017-11-28 DIAGNOSIS — R4701 Aphasia: Secondary | ICD-10-CM | POA: Insufficient documentation

## 2017-11-28 DIAGNOSIS — Z7982 Long term (current) use of aspirin: Secondary | ICD-10-CM | POA: Diagnosis not present

## 2017-11-28 DIAGNOSIS — Z950 Presence of cardiac pacemaker: Secondary | ICD-10-CM | POA: Diagnosis not present

## 2017-11-28 DIAGNOSIS — R4182 Altered mental status, unspecified: Secondary | ICD-10-CM | POA: Diagnosis not present

## 2017-11-28 DIAGNOSIS — Z87891 Personal history of nicotine dependence: Secondary | ICD-10-CM | POA: Insufficient documentation

## 2017-11-28 DIAGNOSIS — I495 Sick sinus syndrome: Secondary | ICD-10-CM

## 2017-11-28 DIAGNOSIS — H409 Unspecified glaucoma: Secondary | ICD-10-CM | POA: Diagnosis not present

## 2017-11-28 DIAGNOSIS — E785 Hyperlipidemia, unspecified: Secondary | ICD-10-CM | POA: Insufficient documentation

## 2017-11-28 DIAGNOSIS — F0280 Dementia in other diseases classified elsewhere without behavioral disturbance: Secondary | ICD-10-CM | POA: Diagnosis not present

## 2017-11-28 DIAGNOSIS — I441 Atrioventricular block, second degree: Secondary | ICD-10-CM | POA: Insufficient documentation

## 2017-11-28 DIAGNOSIS — Z79899 Other long term (current) drug therapy: Secondary | ICD-10-CM | POA: Diagnosis not present

## 2017-11-28 DIAGNOSIS — Z01818 Encounter for other preprocedural examination: Secondary | ICD-10-CM | POA: Diagnosis not present

## 2017-11-28 DIAGNOSIS — Z452 Encounter for adjustment and management of vascular access device: Secondary | ICD-10-CM

## 2017-11-28 DIAGNOSIS — I2510 Atherosclerotic heart disease of native coronary artery without angina pectoris: Secondary | ICD-10-CM | POA: Diagnosis not present

## 2017-11-28 DIAGNOSIS — N400 Enlarged prostate without lower urinary tract symptoms: Secondary | ICD-10-CM | POA: Diagnosis not present

## 2017-11-28 DIAGNOSIS — I480 Paroxysmal atrial fibrillation: Secondary | ICD-10-CM | POA: Insufficient documentation

## 2017-11-28 DIAGNOSIS — R55 Syncope and collapse: Secondary | ICD-10-CM | POA: Insufficient documentation

## 2017-11-28 DIAGNOSIS — I10 Essential (primary) hypertension: Secondary | ICD-10-CM | POA: Diagnosis not present

## 2017-11-28 HISTORY — DX: Alzheimer's disease, unspecified: G30.9

## 2017-11-28 HISTORY — PX: PACEMAKER IMPLANT: EP1218

## 2017-11-28 HISTORY — DX: Sick sinus syndrome: I49.5

## 2017-11-28 HISTORY — DX: Unspecified glaucoma: H40.9

## 2017-11-28 HISTORY — DX: Presence of cardiac pacemaker: Z95.0

## 2017-11-28 HISTORY — DX: Dementia in other diseases classified elsewhere, unspecified severity, without behavioral disturbance, psychotic disturbance, mood disturbance, and anxiety: F02.80

## 2017-11-28 HISTORY — PX: LOOP RECORDER REMOVAL: EP1215

## 2017-11-28 HISTORY — PX: INSERT / REPLACE / REMOVE PACEMAKER: SUR710

## 2017-11-28 LAB — CBC
HCT: 29.7 % — ABNORMAL LOW (ref 39.0–52.0)
Hemoglobin: 9.6 g/dL — ABNORMAL LOW (ref 13.0–17.0)
MCH: 27.9 pg (ref 26.0–34.0)
MCHC: 32.3 g/dL (ref 30.0–36.0)
MCV: 86.3 fL (ref 78.0–100.0)
Platelets: 73 10*3/uL — ABNORMAL LOW (ref 150–400)
RBC: 3.44 MIL/uL — ABNORMAL LOW (ref 4.22–5.81)
RDW: 15.9 % — ABNORMAL HIGH (ref 11.5–15.5)
WBC: 8.2 10*3/uL (ref 4.0–10.5)

## 2017-11-28 LAB — BASIC METABOLIC PANEL
Anion gap: 10 (ref 5–15)
BUN: 11 mg/dL (ref 6–20)
CO2: 26 mmol/L (ref 22–32)
Calcium: 9.2 mg/dL (ref 8.9–10.3)
Chloride: 101 mmol/L (ref 101–111)
Creatinine, Ser: 1.24 mg/dL (ref 0.61–1.24)
GFR calc Af Amer: 57 mL/min — ABNORMAL LOW (ref >60)
GFR calc non Af Amer: 49 mL/min — ABNORMAL LOW (ref >60)
Glucose, Bld: 126 mg/dL — ABNORMAL HIGH (ref 65–99)
Potassium: 3.7 mmol/L (ref 3.5–5.1)
Sodium: 137 mmol/L (ref 135–145)

## 2017-11-28 LAB — GLUCOSE, CAPILLARY: GLUCOSE-CAPILLARY: 118 mg/dL — AB (ref 65–99)

## 2017-11-28 LAB — SURGICAL PCR SCREEN
MRSA, PCR: NEGATIVE
Staphylococcus aureus: NEGATIVE

## 2017-11-28 LAB — LACTIC ACID, PLASMA: Lactic Acid, Venous: 1.6 mmol/L (ref 0.5–1.9)

## 2017-11-28 LAB — PROTIME-INR
INR: 1.29
Prothrombin Time: 16 seconds — ABNORMAL HIGH (ref 11.4–15.2)

## 2017-11-28 LAB — APTT: aPTT: 35 seconds (ref 24–36)

## 2017-11-28 SURGERY — PACEMAKER IMPLANT

## 2017-11-28 MED ORDER — ACETAMINOPHEN 325 MG PO TABS: 325.0000 mg | ORAL_TABLET | ORAL | Status: DC | PRN

## 2017-11-28 MED ORDER — POTASSIUM CHLORIDE CRYS ER 20 MEQ PO TBCR
20.0000 meq | EXTENDED_RELEASE_TABLET | Freq: Every day | ORAL | Status: DC
  Administered 2017-11-29: 11:00:00 20 meq via ORAL
  Filled 2017-11-28: qty 1

## 2017-11-28 MED ORDER — CHLORHEXIDINE GLUCONATE 4 % EX LIQD
60.0000 mL | Freq: Once | CUTANEOUS | Status: DC
  Filled 2017-11-28: qty 60

## 2017-11-28 MED ORDER — YOU HAVE A PACEMAKER BOOK
Freq: Once | Status: AC
  Administered 2017-11-29
  Filled 2017-11-28: qty 1

## 2017-11-28 MED ORDER — SIMVASTATIN 20 MG PO TABS
20.0000 mg | ORAL_TABLET | Freq: Every day | ORAL | Status: DC
  Administered 2017-11-28: 20 mg via ORAL
  Filled 2017-11-28: qty 1

## 2017-11-28 MED ORDER — MIDAZOLAM HCL 5 MG/5ML IJ SOLN
INTRAMUSCULAR | Status: DC | PRN
  Administered 2017-11-28: 1 mg via INTRAVENOUS

## 2017-11-28 MED ORDER — IOPAMIDOL (ISOVUE-370) INJECTION 76%
INTRAVENOUS | Status: DC | PRN
Start: 2017-11-28 — End: 2017-11-28
  Administered 2017-11-28: 10 mL via INTRAVENOUS

## 2017-11-28 MED ORDER — MUPIROCIN 2 % EX OINT
1.0000 "application " | TOPICAL_OINTMENT | Freq: Once | CUTANEOUS | Status: AC
  Administered 2017-11-28: 1 via TOPICAL

## 2017-11-28 MED ORDER — CEFAZOLIN SODIUM-DEXTROSE 1-4 GM/50ML-% IV SOLN
1.0000 g | Freq: Four times a day (QID) | INTRAVENOUS | Status: AC
  Administered 2017-11-28 – 2017-11-29 (×3): 1 g via INTRAVENOUS
  Filled 2017-11-28 (×4): qty 50

## 2017-11-28 MED ORDER — ONDANSETRON HCL 4 MG/2ML IJ SOLN: 4.0000 mg | Freq: Four times a day (QID) | INTRAMUSCULAR | Status: DC | PRN

## 2017-11-28 MED ORDER — SODIUM CHLORIDE 0.9 % IV SOLN
INTRAVENOUS | Status: DC
  Administered 2017-11-28: 13:00:00 via INTRAVENOUS

## 2017-11-28 MED ORDER — SODIUM CHLORIDE 0.9 % IR SOLN
80.0000 mg | Status: AC
  Administered 2017-11-28: 80 mg

## 2017-11-28 MED ORDER — CEFAZOLIN SODIUM-DEXTROSE 2-4 GM/100ML-% IV SOLN
INTRAVENOUS | Status: AC
Start: 2017-11-28 — End: 2017-11-28
  Filled 2017-11-28: qty 100

## 2017-11-28 MED ORDER — IOPAMIDOL (ISOVUE-370) INJECTION 76%
INTRAVENOUS | Status: AC
  Filled 2017-11-28: qty 50

## 2017-11-28 MED ORDER — FENTANYL CITRATE (PF) 100 MCG/2ML IJ SOLN
INTRAMUSCULAR | Status: DC | PRN
Start: 2017-11-28 — End: 2017-11-28
  Administered 2017-11-28: 25 ug via INTRAVENOUS

## 2017-11-28 MED ORDER — LIDOCAINE HCL (PF) 1 % IJ SOLN
INTRAMUSCULAR | Status: DC | PRN
  Administered 2017-11-28: 35 mL

## 2017-11-28 MED ORDER — ENSURE ENLIVE PO LIQD
237.0000 mL | Freq: Two times a day (BID) | ORAL | Status: DC
  Administered 2017-11-29: 11:00:00 237 mL via ORAL
  Filled 2017-11-28 (×4): qty 237

## 2017-11-28 MED ORDER — DONEPEZIL HCL 10 MG PO TABS
10.0000 mg | ORAL_TABLET | Freq: Every day | ORAL | Status: DC
  Administered 2017-11-28: 21:00:00 10 mg via ORAL
  Filled 2017-11-28 (×2): qty 1

## 2017-11-28 MED ORDER — HEPARIN (PORCINE) IN NACL 2-0.9 UNIT/ML-% IJ SOLN
INTRAMUSCULAR | Status: AC | PRN
  Administered 2017-11-28: 500 mL

## 2017-11-28 MED ORDER — FENTANYL CITRATE (PF) 100 MCG/2ML IJ SOLN
INTRAMUSCULAR | Status: AC
  Filled 2017-11-28: qty 2

## 2017-11-28 MED ORDER — METOPROLOL TARTRATE 25 MG PO TABS
25.0000 mg | ORAL_TABLET | Freq: Two times a day (BID) | ORAL | Status: DC
  Administered 2017-11-28 – 2017-11-29 (×2): 25 mg via ORAL
  Filled 2017-11-28 (×2): qty 1

## 2017-11-28 MED ORDER — CEFAZOLIN SODIUM-DEXTROSE 2-4 GM/100ML-% IV SOLN
2.0000 g | INTRAVENOUS | Status: AC
  Administered 2017-11-28: 2 g via INTRAVENOUS

## 2017-11-28 MED ORDER — MIDAZOLAM HCL 5 MG/5ML IJ SOLN
INTRAMUSCULAR | Status: AC
  Filled 2017-11-28: qty 5

## 2017-11-28 MED ORDER — MUPIROCIN 2 % EX OINT
TOPICAL_OINTMENT | CUTANEOUS | Status: AC
  Administered 2017-11-28: 1 via TOPICAL
  Filled 2017-11-28: qty 22

## 2017-11-28 MED ORDER — OFF THE BEAT BOOK
Freq: Once | Status: AC
  Administered 2017-11-29
  Filled 2017-11-28: qty 1

## 2017-11-28 MED ORDER — HYDROCHLOROTHIAZIDE 25 MG PO TABS
12.5000 mg | ORAL_TABLET | Freq: Every day | ORAL | Status: DC
  Administered 2017-11-29: 12.5 mg via ORAL
  Filled 2017-11-28: qty 1

## 2017-11-28 MED ORDER — HEPARIN (PORCINE) IN NACL 2-0.9 UNIT/ML-% IJ SOLN
INTRAMUSCULAR | Status: AC
  Filled 2017-11-28: qty 500

## 2017-11-28 MED ORDER — LIDOCAINE HCL (PF) 1 % IJ SOLN
INTRAMUSCULAR | Status: AC
  Filled 2017-11-28: qty 60

## 2017-11-28 MED ORDER — DOXAZOSIN MESYLATE 4 MG PO TABS
4.0000 mg | ORAL_TABLET | Freq: Every day | ORAL | Status: DC
  Administered 2017-11-28: 4 mg via ORAL
  Filled 2017-11-28 (×2): qty 1

## 2017-11-28 MED ORDER — GENTAMICIN SULFATE 40 MG/ML IJ SOLN
INTRAMUSCULAR | Status: AC
  Filled 2017-11-28: qty 2

## 2017-11-28 SURGICAL SUPPLY — 9 items
CABLE SURGICAL S-101-97-12 (CABLE) ×2 IMPLANT
IPG PACE AZUR XT DR MRI W1DR01 (Pacemaker) IMPLANT
LEAD CAPSURE NOVUS 5076-52CM (Lead) ×2 IMPLANT
LEAD CAPSURE NOVUS 5076-58CM (Lead) ×2 IMPLANT
PACE AZURE XT DR MRI W1DR01 (Pacemaker) ×3 IMPLANT
PACK LOOP INSERTION (CUSTOM PROCEDURE TRAY) ×3 IMPLANT
PAD DEFIB LIFELINK (PAD) ×2 IMPLANT
SHEATH CLASSIC 7F (SHEATH) ×4 IMPLANT
TRAY PACEMAKER INSERTION (PACKS) ×2 IMPLANT

## 2017-11-28 NOTE — Op Note (Signed)
Procedure report  Procedure performed:  1. Implantation of new dual chamber permanent pacemaker 2. Fluoroscopy 3. Light sedation 4. LUE venogram 5. Loop recorder explantation.    Reason for procedure: Symptomatic bradycardia and syncope due to: Sinus node dysfunction Sinus arrest Tachycardia-bradycardia syndrome (paroxysmal atrial fibrillation) Second degree atrioventricular block Mobitz type I    Procedure performed by: Sanda Klein, MD  Complications: None  Estimated blood loss: <10 mL  Medications administered during procedure: Ancef 2 g intravenously Lidocaine 1% 35 mL locally,  Fentanyl 25 mcg intravenously Versed 1 mg intravenously Isovue 10 mL IV  During this procedure the patient is administered a total of Versed 1 mg and Fentanyl 25 mcg to achieve and maintain moderate conscious sedation.  The patient's heart rate, blood pressure, and oxygen saturation are monitored continuously during the procedure. The period of conscious sedation is 82 minutes, of which I was present face-to-face 100% of this time.   Device details: Generator Medtronic [place] DR model [id number] serial number [id number] H Right atrial lead Medtronic [id number] serial number [id number] Right ventricular lead Medtronic [id number] serial number [id number]  Procedure details:  After the risks and benefits of the procedure were discussed the patient provided informed consent and was brought to the cardiac cath lab in the fasting state. The patient was prepped and draped in usual sterile fashion. Local anesthesia with 1% lidocaine was administered to to the left infraclavicular area. A 5-6 cm horizontal incision was made parallel with and 2-3 cm caudal to the left clavicle. Using electrocautery and blunt dissection a prepectoral pocket was created down to the level of the pectoralis major muscle fascia. The pocket was carefully inspected for hemostasis. An antibiotic-soaked sponge was placed in the  pocket.  Under fluoroscopic guidance and using the modified Seldinger technique 2 separate venipunctures were performed to access the left subclavian vein. Substantial difficulty was encountered accessing the vein and a venogram was performed.  Only one venipuncture was performed, then a sheath was placed and used to insert 2 J tipped guidewires. The two J-tip guidewires were subsequently exchanged for two 7 French safe sheaths.  Under fluoroscopic guidance the ventricular lead was advanced to level of the mid to apical right ventricular septum and thet active-fixation helix was deployed. Prominent current of injury was seen. After several minutes of monitoring the R wave amplitude steadily diminished and the helix was retracted and the lead was repositioned.  Satisfactory pacing and sensing parameters were recorded. There was no evidence of diaphragmatic stimulation at maximum device output. The safe sheath was peeled away and the lead was secured in place with 2-0 silk.  In similar fashion the right atrial lead was advanced to the level of the atrial appendage. The active-fixation helix was deployed. There was prominent current of injury. Satisfactory  pacing and sensing parameters were recorded. There was no evidence of diaphragmatic stimulation with pacing at maximum device output. The safe sheath was peeled away and the lead was secured in place with 2-0 silk.  The antibiotic-soaked sponge was removed from the pocket. The pocket was flushed with copious amounts of antibiotic solution. Reinspection showed excellent hemostasis..  The ventricular lead was connected to the generator and appropriate ventricular pacing was seen. Subsequently the atrial lead was also connected. Repeat testing of the lead parameters later showed excellent values.  The entire system was then carefully inserted in the pocket with care been taking that the leads and device assumed a comfortable position without pressure on the  incision. Great care  was taken that the leads be located deep to the generator. The pocket was then closed in layers using 2 layers of 2-0 Vicryl and cutaneous staples, after which a sterile dressing was applied.  The loop recorder was then explanted. Local anesthesia with 5 mL lidocaine was performed. A 1 cm incision was performed and the device was easily extracted with a hemostat. The pocket was flushed. Pressure was used for hemostasis.Two staples were applied followed by a sterile dressing.  At the end of the procedure the following lead parameters were encountered:  Right atrial lead sensed P waves 2.4, impedance 940 ohms, threshold 0.5 V at 0.5 ms pulse width.  Right ventricular lead sensed R waves 4.1 mV, impedance 1040 ohms, threshold 0.5 V at 0.5 ms pulse width.   Sanda Klein, MD, [facility] [phone] office [phone] pager

## 2017-11-28 NOTE — Significant Event (Addendum)
Rapid Response Event Note Rapid response page went out from admitting RN  Overview: Time Called: 2115 Arrival Time: 2118 Event Type: Neurologic  Initial Focused Assessment: Pt was alert and oriented answering admitting RN's questions when he lifted his Right arm and went unresponsive. On arrival pt initially lethragic, with in minutes he started to come around but confused, able to open his eyes, answer simple questions and follow simple commands. Skin cool and clammy. Muscle twitching and stiffness noted to all extremities. Pt alert to self and location. He knew he had a pacemaker placed in his Left arm. Pt began trying to sit himself up by pushing off with his Left arm, stating "I need to get up and walk around some." pt complaining of a LLE cramping and cold chills. Pt denies CP/SOB. BP 174/84, HR 100, RR 18, 99% 2L [place]  Interventions: EKG-Afib, not a good read due to twitching of extremities. pacer spikes noted on bedside monitor  CBC, BMP, Lactic Acid, and Pt/INR, Ptt. Still pending CT head Plan of Care (if not transferred): Continue to monitor pt. Joaquim Lai RN encouraged to call for any concerns.  Event Summary: Name of Physician Notified: Dr. Mansfield Lions  at 2120    at    Outcome: Stayed in room and stabalized     [place], [place]

## 2017-11-28 NOTE — Interval H&P Note (Signed)
History and Physical Interval Note:  11/28/2017 12:22 PM  Joshua Burns  has presented today for surgery, with the diagnosis of BRADICARDIA  The various methods of treatment have been discussed with the patient and family. After consideration of risks, benefits and other options for treatment, the patient has consented to  Procedure(s): PACEMAKER IMPLANT - DUAL CHAMBER (N/A) LOOP RECORDER REMOVAL (N/A) as a surgical intervention .  The patient's history has been reviewed, patient examined, no change in status, stable for surgery.  I have reviewed the patient's chart and labs.  Questions were answered to the patient's satisfaction.     Calem Cocozza

## 2017-11-28 NOTE — Progress Notes (Signed)
Received a page from nursing about a transient episode of aphasia.  Patient was talking to his nurse when he reportedly lifted his right arm and subsequently was unable to speak.  There may have been mild whole body tremor during this time.  This lasted several seconds' the patient slowly recovered and was soon back to his baseline.  Rapid response was called.  When I arrived, patient was alert and oriented x3.  He had no focal neurologic deficits on exam.  He was talking with normal cadence and was not slurring his words.  He was hypertensive with systolic blood pressures running in the 170s-190s.  This was actually improved from previously when he was up as high as the 210s.  He denied any other focal complaints, including chest pains or shortness of breath.  Stat noncontrast head CT was ordered, in addition with a full set of labs and a stat chest x-ray to ensure proper lead placement.  Differential includes seizure versus TIA versus intracerebral hemorrhage.  I contacted the on-call neurologist and discussed the case, and given resolution of all neurologic deficits, he agreed with current plan.  He also recommended routine spot EEG in the morning.  We will plan for a formal neurology consult the patient has recurrence of deficits or if any of the above diagnostic workup returns abnormal.  Doylene Canning, MD

## 2017-11-28 NOTE — Progress Notes (Signed)
2100: Admission RN, Susie, yelled out for help from patients room. Agricultural consultant and this RN ran to patients room. Admission RN expressed that she was completing patient's history questions and the patient lifted up his right arm and suddenly became unresponsive. RN found patient lethargic, mildly diaphoretic and confused, softly speaking.  2100 BP: 119/59 2103 BP: 175/72 2107 BP: 196/72  RN notified Rapid response, Magda Paganini, and On call Cardiologist, [place].   Rapid and MD at beside, plus two SWOT RNs.  Patient started having muscle twitching and complained of LLE cramping. Patient also complained of being cold. Temp checked: 98.0 Patient began to come around, able to state his name, date of birth, place, situation but not time. Patient denied having pain but stated he was "weak".   MD ordered EKG, Head CT, CXR, CBC, BMP, LA, and PT/INR  SWOT RNs escorted patient to Radiology.   RN will continue to monitor patient.

## 2017-11-29 ENCOUNTER — Ambulatory Visit (HOSPITAL_COMMUNITY): Payer: Medicare Other

## 2017-11-29 ENCOUNTER — Encounter (HOSPITAL_COMMUNITY): Payer: Self-pay | Admitting: Cardiovascular Disease

## 2017-11-29 ENCOUNTER — Other Ambulatory Visit: Payer: Self-pay | Admitting: Cardiovascular Disease

## 2017-11-29 ENCOUNTER — Other Ambulatory Visit: Payer: Self-pay

## 2017-11-29 DIAGNOSIS — I480 Paroxysmal atrial fibrillation: Secondary | ICD-10-CM

## 2017-11-29 DIAGNOSIS — R55 Syncope and collapse: Secondary | ICD-10-CM | POA: Diagnosis not present

## 2017-11-29 DIAGNOSIS — H409 Unspecified glaucoma: Secondary | ICD-10-CM | POA: Diagnosis not present

## 2017-11-29 DIAGNOSIS — I441 Atrioventricular block, second degree: Secondary | ICD-10-CM | POA: Diagnosis not present

## 2017-11-29 DIAGNOSIS — I495 Sick sinus syndrome: Secondary | ICD-10-CM | POA: Diagnosis not present

## 2017-11-29 DIAGNOSIS — E785 Hyperlipidemia, unspecified: Secondary | ICD-10-CM | POA: Diagnosis not present

## 2017-11-29 DIAGNOSIS — I10 Essential (primary) hypertension: Secondary | ICD-10-CM | POA: Diagnosis not present

## 2017-11-29 DIAGNOSIS — Z79899 Other long term (current) drug therapy: Secondary | ICD-10-CM | POA: Diagnosis not present

## 2017-11-29 DIAGNOSIS — I2510 Atherosclerotic heart disease of native coronary artery without angina pectoris: Secondary | ICD-10-CM | POA: Diagnosis not present

## 2017-11-29 DIAGNOSIS — Z87891 Personal history of nicotine dependence: Secondary | ICD-10-CM | POA: Diagnosis not present

## 2017-11-29 DIAGNOSIS — R0989 Other specified symptoms and signs involving the circulatory and respiratory systems: Secondary | ICD-10-CM | POA: Diagnosis not present

## 2017-11-29 DIAGNOSIS — N400 Enlarged prostate without lower urinary tract symptoms: Secondary | ICD-10-CM | POA: Diagnosis not present

## 2017-11-29 DIAGNOSIS — Z7982 Long term (current) use of aspirin: Secondary | ICD-10-CM | POA: Diagnosis not present

## 2017-11-29 MED ORDER — METOPROLOL TARTRATE 25 MG PO TABS: 25.0000 mg | ORAL_TABLET | Freq: Two times a day (BID) | ORAL | 3 refills | Status: DC

## 2017-11-29 MED ORDER — APIXABAN 2.5 MG PO TABS: 2.5000 mg | ORAL_TABLET | Freq: Two times a day (BID) | ORAL | 3 refills | Status: DC

## 2017-11-29 NOTE — Discharge Summary (Signed)
Discharge Summary    Patient ID: Joshua Burns,  MRN: [id number], DOB/AGE: January 10, 1925 83 y.o.  Admit date: 11/28/2017 Discharge date: 11/29/2017  Primary Care Provider: [facility] Cardiologist: Dr. Sallyanne Kuster  Discharge Diagnoses    Principal Problem:   Tachycardia-bradycardia syndrome [facility]) Active Problems:   Sick sinus syndrome ([place])   Syncope   Second degree Mobitz I AV block   Paroxysmal atrial fibrillation (H[facility])   Pacemaker   History of Present Illness     Joshua Burns is a 83 y.o. male with past medical history of syncope (ILR in place), PAF (on Eliquis), HTN, and HLD who presented to [facility] on 11/28/2017 for planned PPM placement after being found to have significant sinus pauses and bradycardia on his ILR interrogation.  Hospital Course     Consultants: None  He underwent placement of a Generator Medtronic Azure XT DR model [id number] with Right atrial lead Medtronic [id number] and Right ventricular lead Medtronic [phone]. The full procedural report is included below. He tolerated the procedure well with no immediate complications noted. ILR was extracted at that time.   Overnight, he had a brief episode of aphasia with a mild body tremor noted. He quickly became A&Ox3 and symptoms resolved within 2 minutes. No focal neurological deficits noted. No significant arrhythmias noted on telemetry during this time. Neurology was contacted and it was recommended a Head CT and EEG be obtained. His Head CT showed no acute intracranial abnormalities with only small vessel chronic ischemic changes noted. EEG was obtained on 12/6 and showed no acute findings.   On 11/29/2017, he denied any recurrent symptoms. No neurological deficits were noted on examination. PPM site was stable. The device was interrogated and functioning normally. CXR showed no evidence of a pneumothorax. He was last examined by Dr. Stanford Breed and deemed stable for discharge. Eliquis dosing was reduced to  2.5mg  daily in the setting of age being > 85 and weight < 60 kg. Was also started on Lopressor 25mg  BID for his atrial fibrillation. A device check along with follow-up with Dr. Sallyanne Kuster has been arranged.  _____________  Discharge Vitals Blood pressure 128/66, pulse 66, temperature 98.6 F (37 C), temperature source Oral, resp. rate 17, height 5\' 6"  (1.676 m), weight 127 lb 13.9 oz (58 kg), SpO2 96 %.  Filed Weights   11/28/17 1135 11/29/17 0448  Weight: 132 lb (59.9 kg) 127 lb 13.9 oz (58 kg)    Labs & Radiologic Studies     CBC Recent Labs    11/28/17 2118  WBC 8.2  HGB 9.6*  HCT 29.7*  MCV 86.3  PLT 73*   Basic Metabolic Panel Recent Labs    11/28/17 2118  NA 137  K 3.7  CL 101  CO2 26  GLUCOSE 126*  BUN 11  CREATININE 1.24  CALCIUM 9.2   Liver Function Tests No results for input(s): AST, ALT, ALKPHOS, BILITOT, PROT, ALBUMIN in the last 72 hours. No results for input(s): LIPASE, AMYLASE in the last 72 hours. Cardiac Enzymes No results for input(s): CKTOTAL, CKMB, CKMBINDEX, TROPONINI in the last 72 hours. BNP Invalid input(s): POCBNP D-Dimer No results for input(s): DDIMER in the last 72 hours. Hemoglobin A1C No results for input(s): HGBA1C in the last 72 hours. Fasting Lipid Panel No results for input(s): CHOL, HDL, LDLCALC, TRIG, CHOLHDL, LDLDIRECT in the last 72 hours. Thyroid Function Tests No results for input(s): TSH, T4TOTAL, T3FREE, THYROIDAB in the last 72 hours.  Invalid input(s): FREET3  Dg Chest 2 View  Result Date: 11/29/2017 CLINICAL DATA:  The patient has undergone pacemaker replacement on November 28, 2017 EXAM: CHEST  2 VIEW COMPARISON:  Chest x-ray of November 28, 2017 FINDINGS: The lungs are well-expanded and clear. There is no pneumothorax. The pacemaker general generator overlies the upper left pectoral region. The electrodes are in reasonable position. The mediastinum is normal in width. The heart and pulmonary vascularity are normal.  IMPRESSION: There is no active cardiopulmonary disease. No postprocedure complication following pacemaker placement. Electronically Signed   By: David  Martinique M.D.   On: 11/29/2017 08:09   Dg Chest 2 View  Result Date: 11/28/2017 CLINICAL DATA:  83 year old male with pacemaker placement. EXAM: CHEST  2 VIEW COMPARISON:  Chest radiograph dated 08/18/2017 FINDINGS: There are atelectatic changes of the left lung base. No focal consolidation, pleural effusion, or pneumothorax. There is stable mild cardiomegaly. A left pectoral dual lead pacemaker device noted the pacemaker wires appear intact. Partially visualized lower cervical fixation hardware. No acute osseous pathology. There is atherosclerotic calcification of the aorta. IMPRESSION: 1. No acute cardiopulmonary process. 2. Mild cardiomegaly. 3. Left pectoral dual lead pacemaker device with intact wires. No pneumothorax. Electronically Signed   By: Anner Crete M.D.   On: 11/28/2017 23:56   Ct Head Wo Contrast  Result Date: 11/28/2017 CLINICAL DATA:  Altered level of consciousness, history hypertension, coronary artery disease, Alzheimer's EXAM: CT HEAD WITHOUT CONTRAST TECHNIQUE: Contiguous axial images were obtained from the base of the skull through the vertex without intravenous contrast. Sagittal and coronal MPR images reconstructed from axial data set. COMPARISON:  08/18/2017 FINDINGS: Brain: Generalized atrophy. Normal ventricular morphology. No midline shift or mass effect. Minimal small vessel chronic ischemic changes of deep cerebral white matter. Otherwise normal appearance of brain parenchyma. No intracranial hemorrhage, mass lesion or evidence acute infarction. No extra-axial fluid collections. Vascular: Atherosclerotic calcification of internal carotid and vertebral arteries at skullbase Skull: Intact Sinuses/Orbits: Clear Other: N/A IMPRESSION: Atrophy with minimal small vessel chronic ischemic changes of deep cerebral white matter. No  acute intracranial abnormalities. Electronically Signed   By: Lavonia Dana M.D.   On: 11/28/2017 22:04     Diagnostic Studies/Procedures     Pacemaker Implant: 11/28/2017  Procedure performed:  1. Implantation of new dual chamber permanent pacemaker 2. Fluoroscopy 3. Light sedation 4. LUE venogram 5. Loop recorder explantation.  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  The patient's heart rate, blood pressure, and oxygen saturation are monitored continuously during the procedure. The period of conscious sedation is 82 minutes, of which I was present face-to-face 100% of this time.   Device details: Generator Medtronic [place] DR model [id number] serial number [id number] Right atrial lead Medtronic [id number] serial number [id number] Right ventricular lead Medtronic [id number] serial number [id number]  Procedure details: After the risks and benefits of the procedure were discussed the patient provided informed consent and was brought to the cardiac cath lab in the fasting state. The patient was  prepped and draped in usual sterile fashion. Local anesthesia with 1% lidocaine was administered to to the left infraclavicular area. A 5-6 cm horizontal incision was made parallel with and 2-3 cm caudal to the left clavicle. Using electrocautery and blunt dissection a prepectoral pocket was created  down to the level of the pectoralis major muscle fascia. The pocket was carefully inspected for hemostasis. An antibiotic-soaked sponge was placed in the pocket.  Under fluoroscopic guidance and using the modified Seldinger technique 2 separate venipunctures were performed to access the left subclavian vein. Substantial difficulty was encountered accessing the vein and a venogram was performed.  Only one venipuncture was performed, then a sheath was placed and used to insert 2 J tipped guidewires. The two J-tip guidewires were subsequently exchanged for two 7 French safe sheaths.  Under fluoroscopic guidance the ventricular lead was advanced to level of the mid to apical right ventricular septum and thet active-fixation helix was deployed. Prominent current of injury was seen. After several minutes of monitoring the R wave amplitude steadily diminished and the helix was retracted and the lead was repositioned.  Satisfactory pacing and sensing parameters were recorded. There was no evidence of diaphragmatic stimulation at maximum device output. The safe sheath was peeled away and the lead was secured in place with 2-0 silk. R waves again showed steady decrease in amplitude, but remained acceptable.  In similar fashion the right atrial lead was advanced to the level of the atrial appendage. The active-fixation helix was deployed. There was prominent current of injury. Satisfactory  pacing and sensing parameters were recorded. There was no evidence of diaphragmatic stimulation with pacing at maximum device output. The safe sheath was peeled away and the lead was secured in place with 2-0 silk.  The antibiotic-soaked sponge was removed from the pocket. The pocket was flushed with copious amounts of antibiotic solution. Reinspection showed excellent hemostasis..  The ventricular lead was connected to the generator and appropriate ventricular pacing was seen. Subsequently the atrial lead was also connected.  Repeat testing of the lead parameters later showed excellent values.  The entire system was then carefully inserted in the pocket with care been taking that the leads and device assumed a comfortable position without pressure on the incision. Great care was taken that the leads be located deep to the generator. The pocket was then closed in layers using 2 layers of 2-0 Vicryl and cutaneous staples, after which a sterile dressing was applied.  The loop recorder was then explanted. Local anesthesia with 5 mL lidocaine was performed. A 1 cm incision was performed and the device was easily extracted with a hemostat. The pocket was flushed. Pressure was used for hemostasis.Two staples were applied followed by a sterile dressing.  At the end of the procedure the following lead parameters were encountered:  Right atrial lead sensed P waves 2.4, impedance 940 ohms, threshold 0.5 V at 0.5 ms pulse width.  Right ventricular lead sensed R waves 4.1 mV, impedance 1040 ohms, threshold 0.5 V at 0.5 ms pulse width.   Disposition   Pt is being discharged home today in good condition.  Follow-up Plans & Appointments    Follow-up Information    Croitoru, Mihai, MD Follow up on 03/13/2018.   Specialty:  Cardiology Why:  Please arrive at 9:45 am for a 10:00 am appt.  Contact information: [address] [place] [address] [phone]        [facility] Follow up  on 12/12/2017.   Why:  Device clinic appt Please arrive at 2:45 pm for a 3:00 pm appt.  Contact information: [place] [address] [phone]         Discharge Instructions    Increase activity slowly   Complete by:  As directed       Discharge Medications     Medication List    TAKE these medications   apixaban 2.5 MG Tabs tablet Commonly known as:  ELIQUIS Take 1 tablet (2.5 mg total) by mouth 2 (two) times  daily. What changed:    medication strength  how much to take   donepezil 10 MG tablet Commonly known as:  ARICEPT Take 1 tablet (10 mg total) by mouth at bedtime.   doxazosin 4 MG tablet Commonly known as:  CARDURA TAKE 1 TABLET BY MOUTH AT BEDTIME   hydrochlorothiazide 12.5 MG tablet Commonly known as:  HYDRODIURIL TAKE 1 TABLET EVERY DAY   metoprolol tartrate 25 MG tablet Commonly known as:  LOPRESSOR Take 1 tablet (25 mg total) by mouth 2 (two) times daily.   potassium chloride SA 20 MEQ tablet Commonly known as:  K-DUR,KLOR-CON Take 1 tablet (20 mEq total) by mouth daily.   simvastatin 20 MG tablet Commonly known as:  ZOCOR TAKE 1 TABLET BY MOUTH EVERY DAY AT BEDTIME        Allergies Allergies  Allergen Reactions  . Ace Inhibitors Swelling  . Diltiazem Swelling and Palpitations     Outstanding Labs/Studies   None  Duration of Discharge Encounter   Greater than 30 minutes including physician time.  Signed, Erma Heritage, PA-C 11/29/2017, 2:17 PM

## 2017-11-29 NOTE — Procedures (Signed)
ELECTROENCEPHALOGRAM REPORT  Date of Study: 11/29/2017  Patient's Name: Joshua Burns MRN: [id number] Date of Birth: 1925/09/29  Referring Provider: Doylene Canning, MD  Clinical History: 83 year old man admitted for implantation of permanent pacemaker when he had episode of unresponsiveness.  Medications: Aricept Cardura Hydrochlorothiazide Metoprolol Zocor Zofran  Technical Summary: A multichannel digital EEG recording measured by the international 10-20 system with electrodes applied with paste and impedances below 5000 ohms performed in our laboratory with EKG monitoring in an awake and drowsy patient.  Hyperventilation was not performed.  Photic stimulation was performed.  The digital EEG was referentially recorded, reformatted, and digitally filtered in a variety of bipolar and referential montages for optimal display.    Description: The patient is awake and drowsy during the recording.  During maximal wakefulness, there is a symmetric, medium voltage 9 Hz posterior dominant rhythm that attenuates with eye opening.  The record is symmetric.  Stage 2 sleep is not seen.  Photic stimulation produced no abnormalities.  There were no epileptiform discharges or electrographic seizures seen.    EKG lead was unremarkable.  Impression: This awake and drowsy EEG is normal.    Clinical Correlation: A normal EEG does not exclude a clinical diagnosis of epilepsy.  If further clinical questions remain, prolonged EEG may be helpful.  Clinical correlation is advised.   Metta Clines, DO

## 2017-11-29 NOTE — Progress Notes (Signed)
EEG completed, results pending. 

## 2017-11-29 NOTE — Discharge Instructions (Addendum)
° ° °  Supplemental Discharge Instructions for  Pacemaker/Defibrillator Patients  Activity No heavy lifting or vigorous activity with your left/right arm for 6 to 8 weeks.  Do not raise your left/right arm above your head for one week.  Gradually raise your affected arm as drawn below.           _       12/02/2017           12/03/2017          12/04/2017              12/05/2017 _  NO DRIVING for  1 week   ;   WOUND CARE - Keep the wound area clean and dry.  Do not get this area wet for one week. No showers for one week; you may shower on  12/05/2017   . - The tape/steri-strips on your wound will fall off; do not pull them off.  No bandage is needed on the site.  DO  NOT apply any creams, oils, or ointments to the wound area. - If you notice any drainage or discharge from the wound, any swelling or bruising at the site, or you develop a fever > 101? F after you are discharged home, call the office at once.  Special Instructions - You are still able to use cellular telephones; use the ear opposite the side where you have your pacemaker/defibrillator.  Avoid carrying your cellular phone near your device. - When traveling through airports, show security personnel your identification card to avoid being screened in the metal detectors.  Ask the security personnel to use the hand wand. - Avoid arc welding equipment, MRI testing (magnetic resonance imaging), TENS units (transcutaneous nerve stimulators).  Call the office for questions about other devices. - Avoid electrical appliances that are in poor condition or are not properly grounded. - Microwave ovens are safe to be near or to operate.  Additional information for defibrillator patients should your device go off: - If your device goes off ONCE and you feel fine afterward, notify the device clinic nurses. - If your device goes off ONCE and you do not feel well afterward, call 911. - If your device goes off TWICE, call 911. - If your device  goes off THREE times in one day, call 911.  DO NOT DRIVE YOURSELF OR A FAMILY MEMBER WITH A DEFIBRILLATOR TO THE HOSPITAL--CALL 911.

## 2017-11-29 NOTE — Progress Notes (Signed)
Progress Note  Patient Name: Joshua Burns Date of Encounter: 11/29/2017  Primary Cardiologist: Dr. Sallyanne Kuster  Subjective   Episode of aphasia and possible tremor overnight. No recurrent symptoms.   Reports mild discomfort along his PPM site but no significant symptoms. Has been ambulating to the restroom without difficulty.   Inpatient Medications    Scheduled Meds: . donepezil  10 mg Oral QHS  . doxazosin  4 mg Oral QHS  . feeding supplement (ENSURE ENLIVE)  237 mL Oral BID BM  . hydrochlorothiazide  12.5 mg Oral Daily  . metoprolol tartrate  25 mg Oral BID  . potassium chloride SA  20 mEq Oral Daily  . simvastatin  20 mg Oral QHS   Continuous Infusions: .  ceFAZolin (ANCEF) IV Stopped (11/29/17 0328)   PRN Meds: acetaminophen, ondansetron (ZOFRAN) IV   Vital Signs    Vitals:   11/28/17 1900 11/28/17 1945 11/28/17 1946 11/29/17 0448  BP: (!) 227/70 (!) 214/89  (!) 126/46  Pulse: 81 77  65  Resp: 16 12 (!) 24 15  Temp:  98.2 F (36.8 C)  99.1 F (37.3 C)  TempSrc: Oral Oral Oral Oral  SpO2: 96% 100% 99% 95%  Weight:    127 lb 13.9 oz (58 kg)  Height:        Intake/Output Summary (Last 24 hours) at 11/29/2017 0753 Last data filed at 11/29/2017 0600 Gross per 24 hour  Intake 460 ml  Output -  Net 460 ml   Filed Weights   11/28/17 1135 11/29/17 0448  Weight: 132 lb (59.9 kg) 127 lb 13.9 oz (58 kg)    Telemetry    Episodes of atrial fibrillation with RVR overnight, HR in the 120's. Now A-paced with HR in 60's. - Personally Reviewed  ECG    A-paced, HR 60.  - Personally Reviewed  Physical Exam   General: Well developed, elderly African American male appearing in no acute distress. Head: Normocephalic, atraumatic.  Neck: Supple without bruits, JVD not elevated. Lungs:  Resp regular and unlabored, CTA without wheezing or rales. Heart: RRR, S1, S2, no S3, S4, or murmur; no rub. PPM site wrapped. No significant erythema noted around site.  Abdomen:  Soft, non-tender, non-distended with normoactive bowel sounds. No hepatomegaly. No rebound/guarding. No obvious abdominal masses. Extremities: No clubbing, cyanosis, or edema. Distal pedal pulses are 2+ bilaterally. Neuro: Alert and oriented X 3. Moves all extremities spontaneously. Psych: Normal affect.  Labs    Chemistry Recent Labs  Lab 11/28/17 2118  NA 137  K 3.7  CL 101  CO2 26  GLUCOSE 126*  BUN 11  CREATININE 1.24  CALCIUM 9.2  GFRNONAA 49*  GFRAA 57*  ANIONGAP 10     Hematology Recent Labs  Lab 11/28/17 2118  WBC 8.2  RBC 3.44*  HGB 9.6*  HCT 29.7*  MCV 86.3  MCH 27.9  MCHC 32.3  RDW 15.9*  PLT 73*    Cardiac EnzymesNo results for input(s): TROPONINI in the last 168 hours. No results for input(s): TROPIPOC in the last 168 hours.   BNPNo results for input(s): BNP, PROBNP in the last 168 hours.   DDimer No results for input(s): DDIMER in the last 168 hours.   Radiology    Dg Chest 2 View  Result Date: 11/28/2017 CLINICAL DATA:  83 year old male with pacemaker placement. EXAM: CHEST  2 VIEW COMPARISON:  Chest radiograph dated 08/18/2017 FINDINGS: There are atelectatic changes of the left lung base. No focal consolidation, pleural effusion,  or pneumothorax. There is stable mild cardiomegaly. A left pectoral dual lead pacemaker device noted the pacemaker wires appear intact. Partially visualized lower cervical fixation hardware. No acute osseous pathology. There is atherosclerotic calcification of the aorta. IMPRESSION: 1. No acute cardiopulmonary process. 2. Mild cardiomegaly. 3. Left pectoral dual lead pacemaker device with intact wires. No pneumothorax. Electronically Signed   By: Anner Crete M.D.   On: 11/28/2017 23:56   Ct Head Wo Contrast  Result Date: 11/28/2017 CLINICAL DATA:  Altered level of consciousness, history hypertension, coronary artery disease, Alzheimer's EXAM: CT HEAD WITHOUT CONTRAST TECHNIQUE: Contiguous axial images were obtained  from the base of the skull through the vertex without intravenous contrast. Sagittal and coronal MPR images reconstructed from axial data set. COMPARISON:  08/18/2017 FINDINGS: Brain: Generalized atrophy. Normal ventricular morphology. No midline shift or mass effect. Minimal small vessel chronic ischemic changes of deep cerebral white matter. Otherwise normal appearance of brain parenchyma. No intracranial hemorrhage, mass lesion or evidence acute infarction. No extra-axial fluid collections. Vascular: Atherosclerotic calcification of internal carotid and vertebral arteries at skullbase Skull: Intact Sinuses/Orbits: Clear Other: N/A IMPRESSION: Atrophy with minimal small vessel chronic ischemic changes of deep cerebral white matter. No acute intracranial abnormalities. Electronically Signed   By: Lavonia Dana M.D.   On: 11/28/2017 22:04    Cardiac Studies   Echocardiogram: 07/2017 Study Conclusions  - Left ventricle: The cavity size was normal. Systolic function was   normal. Wall motion was normal; there were no regional wall   motion abnormalities. Doppler parameters are consistent with   abnormal left ventricular relaxation (grade 1 diastolic   dysfunction). - Aortic valve: There was no regurgitation. - Mitral valve: There was mild regurgitation. - Left atrium: The atrium was normal in size. - Right ventricle: Systolic function was normal. - Right atrium: The atrium was normal in size. - Tricuspid valve: There was mild regurgitation. - Pulmonic valve: There was trivial regurgitation. - Pulmonary arteries: Systolic pressure was mildly increased. PA   peak pressure: 34 mm Hg (S). - Inferior vena cava: The vessel was normal in size. - Pericardium, extracardiac: There was no pericardial effusion.  PPM Placement: 11/28/2017 Procedure performed: 1. Implantation of new dual chamber permanent pacemaker 2. Fluoroscopy 3. Light sedation 4. LUE venogram 5. Loop recorder  explantation.  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  The patient's heart rate, blood pressure, and oxygen saturation are monitored continuously during the procedure. The period of conscious sedation is 82 minutes, of which I was present face-to-face 100% of this time.  Device details: Generator Medtronic [place] DR model [id number] serial number [id number] Right atrial lead Medtronic [id number] serial number [id number] Right ventricular lead Medtronic [id number] serial number [id number]   Patient Profile     83 y.o. male w/ PMH of syncope (ILR in place), PAF (not on anticoagulation given recent falls), HTN, and HLD who presented to [facility] on 11/28/2017 for planned PPM placement after being found to have significant sinus  pauses and bradycardia on his ILR interrogation. Evaluated by Dr. Sallyanne Kuster and PPM placement was recommended.    Assessment & Plan    1. SSS - ILR was placed in 07/2017 following a syncopal event. This showed episodes of sinus bradycardia and sinus pauses. Followed by Dr. Sallyanne Kuster and PPM placement was recommended.  - he underwent placement of a Generator Medtronic Azure XT DR model [id number] with Right atrial lead Medtronic [id number] and Right ventricular lead Medtronic [id number]. - CXR this morning shows no active disease and no evidence of a pneumothorax. Awaiting device  interrogation.   2. Paroxysmal Atrial Fibrillation - This patients CHA2DS2-VASc Score and unadjusted Ischemic Stroke Rate (% per year) is equal to 2.2 % stroke rate/year from a score of 2 (Age (2), HTN)). Per Dr. Victorino December note, can consider initiation of anticoagulation in the future as he was a significant fall risk with his syncopal events.  - in atrial fibrillation with RVR intermittently yesterday evening, now A-paced with HR in the 60's. Has been started on Lopressor 25mg  BID.   3. HTN - BP variable at 107/46 - 227/90 within the past 24 hours. Improved to 126/46 on most recent check. - continue Doxazosin, HCTZ, and Lopressor.   4. Aphasia - patient had a brief episode of aphasia overnight with a mild body tremor noted. Quickly became A&Ox3 and symptoms resolved.  - Neurology was contacted and recommended a Head CT and EEG the following morning.  - no recurrent symptoms since. Head CT showed no acute intracranial abnormalities. Awaiting EEG.   For questions or updates, please contact [place] Please consult [email or web address] for contact info under Cardiology/STEMI.   Signed, Erma Heritage , PA-C 7:53 AM 11/29/2017 Pager: [phone] As above, patient seen and examined.  He denies chest pain or dyspnea.  His pacemaker site is without hematoma.  Patient had an episode of unresponsiveness last evening for 2 minutes.  No significant arrhythmia noted on telemetry.  No other focal neurological symptoms and I am not convinced this was a TIA.  Head CT is negative.  EEG pending.  If unremarkable will discharge home today.  Discussed with Dr. Sallyanne Kuster.  Will resume Eliquis at 2.5 mg twice daily (patient is 83 years old and weight is 58 kg).  Follow-up Dr. Sallyanne Kuster after discharge. > 30 min PA and physician time D2  Kirk Ruths, MD

## 2017-11-30 ENCOUNTER — Telehealth: Payer: Self-pay | Admitting: Cardiovascular Disease

## 2017-11-30 ENCOUNTER — Other Ambulatory Visit: Payer: Self-pay | Admitting: Cardiovascular Disease

## 2017-11-30 DIAGNOSIS — R5383 Other fatigue: Secondary | ICD-10-CM | POA: Insufficient documentation

## 2017-11-30 NOTE — Telephone Encounter (Signed)
Patient's son was instructed to send remote transmission.   Remote transmission reviewed. Presenting rhythm: ApVs, AsVs w/PAC. No episodes recorded. Stable lead measurements/device function.  Ap: 61.3%, Vp:17.5%.   Patient's son is aware of ppm results.   Will forward information to Dr.Croitoru for review.

## 2017-11-30 NOTE — Telephone Encounter (Signed)
New message   Patient son calling to  report fatigue and what he should expect. Patient discharged from hospital 11/29/17. Please call

## 2017-11-30 NOTE — Telephone Encounter (Signed)
Pt of Dr. Sallyanne Kuster s/p PM implantation. Discharge from hospital on 12/6. He had pacemaker placed following abnormal readings on loop recorder showing significant sinus pauses, bradycardia.  Returned call to Arma Heading, son of patient (DPR on file). Notes new problem of patient fatigue, states he got up to do morning routine and got done w shaving and self-care and had to lie back down.   Denies CP, dyspnea, cool or clammy skin, problems at implantation site, fever, dizziness. Patient did have some heavier breathing last night, which did subside and pt denies today. Pt currently resting.  No HR or BP readings - son not sure that pt has a home BP cuff.  Advised some fatigue could be normal following hospital procedure d/t sedating medications. Asked him to continue to observe and call for any worsening symptoms. Will seek advice from Dr. Sallyanne Kuster.  Note that we have been following his loop recorder readings -I was not able to verify whether pacemaker is set up for remote interrogation, but informed son I would route msg to device pool as well for f/u.   He voiced thanks for call, & requests a call back today w any further advice.

## 2017-11-30 NOTE — Telephone Encounter (Signed)
I did start him on a low-dose of beta-blocker after the pacemaker was implanted, to protect from rapid ventricular rates during atrial fibrillation.  I wonder if this is why he has fatigue.  He does not seem to have hypotension.  I think we will just give it another few days to see if he adjusts to the beta-blocker, if not we will stop it and try treatment with diltiazem or verapamil instead

## 2017-12-03 LAB — CUP PACEART REMOTE DEVICE CHECK
Date Time Interrogation Session: 20181125153633
MDC IDC PG IMPLANT DT: 20180827

## 2017-12-05 ENCOUNTER — Encounter: Payer: Medicare Other | Admitting: Cardiovascular Disease

## 2017-12-12 ENCOUNTER — Ambulatory Visit (INDEPENDENT_AMBULATORY_CARE_PROVIDER_SITE_OTHER): Payer: Medicare Other | Admitting: *Deleted

## 2017-12-12 DIAGNOSIS — I495 Sick sinus syndrome: Secondary | ICD-10-CM | POA: Diagnosis not present

## 2017-12-12 LAB — CUP PACEART INCLINIC DEVICE CHECK
Battery Remaining Longevity: 141 mo
Battery Voltage: 3.15 V
Brady Statistic AS VS Percent: 28.55 %
Brady Statistic RA Percent Paced: 64.67 %
Date Time Interrogation Session: 20181219154428
Implantable Lead Implant Date: 20181205
Implantable Lead Location: 753859
Implantable Lead Model: 5076
Implantable Pulse Generator Implant Date: 20181205
Lead Channel Impedance Value: 304 Ohm
Lead Channel Impedance Value: 437 Ohm
Lead Channel Pacing Threshold Amplitude: 0.5 V
Lead Channel Pacing Threshold Amplitude: 0.75 V
Lead Channel Pacing Threshold Pulse Width: 0.4 ms
Lead Channel Sensing Intrinsic Amplitude: 2.5 mV
Lead Channel Setting Pacing Amplitude: 3.5 V
MDC IDC LEAD IMPLANT DT: 20181205
MDC IDC LEAD LOCATION: 753860
MDC IDC MSMT LEADCHNL RA PACING THRESHOLD PULSEWIDTH: 0.4 ms
MDC IDC MSMT LEADCHNL RV IMPEDANCE VALUE: 323 Ohm
MDC IDC MSMT LEADCHNL RV IMPEDANCE VALUE: 456 Ohm
MDC IDC MSMT LEADCHNL RV SENSING INTR AMPL: 5.625 mV
MDC IDC SET LEADCHNL RA PACING AMPLITUDE: 3.5 V
MDC IDC SET LEADCHNL RV PACING PULSEWIDTH: 0.4 ms
MDC IDC SET LEADCHNL RV SENSING SENSITIVITY: 0.9 mV
MDC IDC STAT BRADY AP VP PERCENT: 34.3 %
MDC IDC STAT BRADY AP VS PERCENT: 30.22 %
MDC IDC STAT BRADY AS VP PERCENT: 6.92 %
MDC IDC STAT BRADY RV PERCENT PACED: 41.22 %

## 2017-12-12 NOTE — Patient Instructions (Signed)
Please Call device clinic with carelink monitor serial number [id number]

## 2017-12-12 NOTE — Progress Notes (Signed)
Wound check appointment. Staples removed s/p PPM implant and ILR explant. Wound without redness or edema. Incision edges approximated, wound well healed. Normal device function. Thresholds, sensing, and impedances consistent with implant measurements. Device programmed at 3.5V for extra safety margin until 3 month visit. Histogram distribution appropriate for patient and level of activity. No mode switches. 9 high ventricular rates noted, longest 3 seconds, Max V rate 194 bpm. Patient educated about wound care, arm mobility, lifting restrictions. ROV with [facility] 03/13/18

## 2017-12-26 ENCOUNTER — Ambulatory Visit (INDEPENDENT_AMBULATORY_CARE_PROVIDER_SITE_OTHER): Payer: Medicare Other | Admitting: Family Medicine

## 2017-12-26 ENCOUNTER — Encounter: Payer: Self-pay | Admitting: Family Medicine

## 2017-12-26 VITALS — BP 120/50 | HR 71 | Temp 98.1°F | Ht 64.5 in | Wt 133.5 lb

## 2017-12-26 DIAGNOSIS — I10 Essential (primary) hypertension: Secondary | ICD-10-CM

## 2017-12-26 DIAGNOSIS — R4189 Other symptoms and signs involving cognitive functions and awareness: Secondary | ICD-10-CM | POA: Diagnosis not present

## 2017-12-26 DIAGNOSIS — Z0000 Encounter for general adult medical examination without abnormal findings: Secondary | ICD-10-CM | POA: Diagnosis not present

## 2017-12-26 DIAGNOSIS — I495 Sick sinus syndrome: Secondary | ICD-10-CM | POA: Diagnosis not present

## 2017-12-26 DIAGNOSIS — E785 Hyperlipidemia, unspecified: Secondary | ICD-10-CM

## 2017-12-26 DIAGNOSIS — N401 Enlarged prostate with lower urinary tract symptoms: Secondary | ICD-10-CM

## 2017-12-26 NOTE — Progress Notes (Signed)
Subjective:   Joshua Burns is a 84 y.o. male who presents for Medicare Annual/Subsequent preventive examination.  Diet Carry out at dinner Breakfast; May have an egg, bacon, pancakes Eats 2 times a day Son cooks   BMI 22   Exercise Floor exercise;  Push ups and squats; still does these every day  Was a veteran but has not applied for benefits Given information to do so     Cardiac Risk Factors include: advanced age (>48men, >28 women);dyslipidemia;family history of premature cardiovascular disease;hypertension      Objective:    Vitals: BP (!) 120/50 (BP Location: Left Arm, Patient Position: Sitting, Cuff Size: Normal)   Pulse 71   Temp 98.1 F (36.7 C) (Oral)   Ht 5' 4.5" (1.638 m)   Wt 133 lb 8 oz (60.6 kg)   SpO2 97%   BMI 22.56 kg/m   Body mass index is 22.56 kg/m.  Advanced Directives 12/26/2017 11/28/2017 11/28/2017 08/18/2017 01/27/2017 09/02/2015  Does Patient Have a Medical Advance Directive? Yes Yes Yes No No No  Type of Advance Directive - Healthcare Power of [place];Living will [place];Living will - - -  Does patient want to make changes to medical advance directive? - - No - Patient declined - - -  Copy of [place] in Chart? - No - copy requested No - copy requested - - -  Would patient like information on creating a medical advance directive? - - - No - Patient declined - No - patient declined information    Tobacco Social History   Tobacco Use  Smoking Status Former Smoker  . Packs/day: 1.00  . Years: 38.00  . Pack years: 38.00  . Types: Cigarettes  . Last attempt to quit: 12/25/1978  . Years since quitting: 39.0  Smokeless Tobacco Never Used     Counseling given: Yes quit smoking many years ago > 25 Aged out of AAA screen   Clinical Intake:     Past Medical History:  Diagnosis Date  . Alzheimer disease   . Arthritis    "knees" (11/28/2017)  . CAD (coronary artery disease)   . Glaucoma, both eyes     . HYPERLIPIDEMIA 01/28/2010  . HYPERTENSION 01/28/2010  . HYPERTROPHY PROSTATE W/UR OBST & OTH LUTS 01/28/2010  . Presence of permanent cardiac pacemaker 11/28/2017   Past Surgical History:  Procedure Laterality Date  . ANTERIOR CERVICAL DECOMP/DISCECTOMY FUSION  02/2003   Archie Endo 05/08/2011  . BRAIN SURGERY    . CARDIOVASCULAR STRESS TEST  08-04-2008   EF 0%  . CATARACT EXTRACTION W/ INTRAOCULAR LENS  IMPLANT, BILATERAL Bilateral   . CORONARY ANGIOPLASTY  01/2003   Archie Endo 05/08/2011  . INGUINAL HERNIA REPAIR Right 03/2004   Archie Endo 05/08/2011  . INSERT / REPLACE / REMOVE PACEMAKER  11/28/2017  . KNEE ARTHROSCOPY Left 09/2011   Archie Endo 09/26/2011  . LOOP RECORDER INSERTION N/A 08/20/2017   Procedure: LOOP RECORDER INSERTION;  Surgeon: Sanda Klein, MD;  Location: [facility];  Service: Cardiovascular;  Laterality: N/A;  . LOOP RECORDER REMOVAL  11/28/2017  . LOOP RECORDER REMOVAL N/A 11/28/2017   Procedure: LOOP RECORDER REMOVAL;  Surgeon: Sanda Klein, MD;  Location: [facility];  Service: Cardiovascular;  Laterality: N/A;  . PACEMAKER IMPLANT N/A 11/28/2017   Procedure: PACEMAKER IMPLANT - DUAL CHAMBER;  Surgeon: Sanda Klein, MD;  Location: [facility];  Service: Cardiovascular;  Laterality: N/A;  . PROSTATE BIOPSY  03/2008  . US  ECHOCARDIOGRAPHY  08-04-2008   Est EF 55-60%   Family History  Problem Relation Age of Onset  . Heart disease Mother   . Heart disease Father   . Aneurysm Brother    Social History   Socioeconomic History  . Marital status: Widowed    Spouse name: None  . Number of children: 3  . Years of education: None  . Highest education level: None  Social Needs  . Financial resource strain: None  . Food insecurity - worry: None  . Food insecurity - inability: None  . Transportation needs - medical: None  . Transportation needs - non-medical: None  Occupational History    Employer: [employer]  Tobacco Use  . Smoking status:  Former Smoker    Packs/day: 1.00    Years: 38.00    Pack years: 38.00    Types: Cigarettes    Last attempt to quit: 12/25/1978    Years since quitting: 39.0  . Smokeless tobacco: Never Used  Substance and Sexual Activity  . Alcohol use: No    Comment: does not   . Drug use: No  . Sexual activity: None  Other Topics Concern  . None  Social History Narrative  . None    Outpatient Encounter Medications as of 12/26/2017  Medication Sig  . apixaban (ELIQUIS) 2.5 MG TABS tablet Take 1 tablet (2.5 mg total) by mouth 2 (two) times daily.  Marland Kitchen donepezil (ARICEPT) 10 MG tablet Take 1 tablet (10 mg total) by mouth at bedtime.  Marland Kitchen doxazosin (CARDURA) 4 MG tablet TAKE 1 TABLET BY MOUTH AT BEDTIME  . hydrochlorothiazide (HYDRODIURIL) 12.5 MG tablet TAKE 1 TABLET EVERY DAY  . metoprolol tartrate (LOPRESSOR) 25 MG tablet Take 1 tablet (25 mg total) by mouth 2 (two) times daily.  . potassium chloride SA (K-DUR,KLOR-CON) 20 MEQ tablet Take 1 tablet (20 mEq total) by mouth daily.  . simvastatin (ZOCOR) 20 MG tablet TAKE 1 TABLET BY MOUTH EVERY DAY AT BEDTIME   No facility-administered encounter medications on file as of 12/26/2017.     Activities of Daily Living In your present state of health, do you have any difficulty performing the following activities: 12/26/2017 11/28/2017  Hearing? N -  Vision? N -  Difficulty concentrating or making decisions? Y -  Comment see MMSE -  Walking or climbing stairs? N -  Dressing or bathing? N -  Doing errands, shopping? N N  Preparing Food and eating ? N -  Using the Toilet? N -  In the past six months, have you accidently leaked urine? N -  Do you have problems with loss of bowel control? N -  Managing your Medications? N -  Managing your Finances? N -  Housekeeping or managing your Housekeeping? N -  Some recent data might be hidden    Patient Care Team: Eulas Post, MD as PCP - General [place], Peter M, MD (Cardiology)   Assessment:   This is a  routine wellness examination for Joshua Burns.  Exercise Activities and Dietary recommendations Current Exercise Habits: Home exercise routine, Type of exercise: strength training/weights, Time (Minutes): 10, Frequency (Times/Week): 6, Weekly Exercise (Minutes/Week): 60, Intensity: Mild  Goals    . Patient Stated     Stay active and keep playing cards        Fall Risk Fall Risk  12/26/2017 11/29/2017 10/31/2016 08/23/2016 07/15/2015  Falls in the past year? Yes No No No No  Comment - Emmi Telephone Survey: data to providers  prior to load - Emmi Telephone Survey: data to providers prior to load -  Number falls in past yr: 1 - - - -  Injury with Fall? Yes - - - -  Comment Head injury - - - -  Risk for fall due to : Other (Comment) - - - -  Risk for fall due to: Comment needed a pace maker - - - -   Needed a pacemaker and now doing well  No more syncopal episodes   Son lives with him  in one level home  Timed Get Up and Go Performed: normal  Depression Screen PHQ 2/9 Scores 12/26/2017 10/31/2016 07/15/2015 01/14/2015  PHQ - 2 Score 0 0 0 0    Cognitive Function MMSE - Mini Mental State Exam 12/26/2017  Orientation to time 2  Orientation to time comments missed day, year and date  Orientation to Place 3  Orientation to Place-comments could not think of Dr.  Elease Hashimoto name, did not know county  Registration 3  Attention/ Calculation 3  Recall 0  Language- name 2 objects 2  Language- repeat 1  Language- follow 3 step command 3  Language- read & follow direction 1  Write a sentence 1  Copy design 1  Total score 20     Omitted   test picture; Recall issues noted but plays cards with 4 to 5 others and wins per his son;  Takes his own medicine Son notices some memory issues but not interfering with his life or ability to function. Affect pleasant  Written sentence was "Why are you asking me these questions? " Stopped the assessment at this point; omitted picture but do not feel it would  have been an issue for him  Memory issues explained Still drives but denies getting lost  Taking aricept        Immunization History  Administered Date(s) Administered  . Influenza Split 09/19/2012  . Influenza, Seasonal, Injecte, Preservative Fre 09/29/2013, 09/30/2014  . Influenza,inj,Quad PF,6+ Mos 09/09/2015  . Influenza-Unspecified 09/17/2016, 10/01/2017  . Pneumococcal Conjugate-13 12/05/2016  . Pneumococcal Polysaccharide-23 06/13/2012  . Tdap 08/18/2017  . Zoster 07/15/2012    Qualifies for Shingles Vaccine - educated regarding shingrix and does not plan to take at this time; had the zoster.   Screening Tests Health Maintenance  Topic Date Due  . TETANUS/TDAP  08/19/2027  . INFLUENZA VACCINE  Completed  . PNA vac Low Risk Adult  Completed       Plan:      PCP Notes   Health Maintenance There are no preventive care reminders to display for this patient. Educated regarding the shingrix but declines at this time    Abnormal Screens  MMSE 20/29; did not test picture but still playing cards with group of 4 and winning per the son. Card game requires memory strategy and still does well. Orientation and recall is an issue but has not had any failures of daily living at home. Son lives with him x 2 years  He is currently on Aricept Weight is maintained with BMI of 22  Referrals  Encouraged apply for VA benefits if he meets criteria due to more benefits including home care   Patient concerns; None   Nurse Concerns; As noted   Next PCP apt- was today   I have personally reviewed and noted the following in the patient's chart:   . Medical and social history . Use of alcohol, tobacco or illicit drugs  . Current medications and supplements .  Functional ability and status . Nutritional status . Physical activity . Advanced directives . List of other physicians . Hospitalizations, surgeries, and ER visits in previous 12 months . Vitals . Screenings to  include cognitive, depression, and falls . Referrals and appointments  In addition, I have reviewed and discussed with patient certain preventive protocols, quality metrics, and best practice recommendations. A written personalized care plan for preventive services as well as general preventive health recommendations were provided to patient.     XBJYN,WGNFA, RN  12/26/2017  Agree with assessment as above.  Eulas Post MD [facility]

## 2017-12-26 NOTE — Progress Notes (Signed)
Subjective:     Patient ID: Joshua Burns, male   DOB: [birth year], 84 y.o.   MRN: [id number]  HPI Patient seen for Medicare wellness visit with our health coach and also for medical follow-up.  His chronic problems include history of cognitive impairment, hyperlipidemia, BPH, hypertension, tachybradycardia syndrome, history of atrial fibrillation, history of CAD. Recent unresponsive episode. EEG showed no seizure activity. Had pacemaker placed back in December and has done well since that time. No recurrent syncope.  He has 3 children including two sons and one daughter and son recently moved here from [place] to help take care of him. He has been widowed for 2 years. His wife died from uterine cancer.  Medications reviewed. Compliant with all. No falls over the past couple months. No BPH symptoms. He's had multiple labs checked with his recent hospitalization and lipids were checked last August and stable.  He's had previous shingles vaccine. Other immunizations up-to-date.  Past Medical History:  Diagnosis Date  . Alzheimer disease   . Arthritis    "knees" (11/28/2017)  . CAD (coronary artery disease)   . Glaucoma, both eyes   . HYPERLIPIDEMIA 01/28/2010  . HYPERTENSION 01/28/2010  . HYPERTROPHY PROSTATE W/UR OBST & OTH LUTS 01/28/2010  . Presence of permanent cardiac pacemaker 11/28/2017   Past Surgical History:  Procedure Laterality Date  . ANTERIOR CERVICAL DECOMP/DISCECTOMY FUSION  02/2003   Archie Endo 05/08/2011  . BRAIN SURGERY    . CARDIOVASCULAR STRESS TEST  08-04-2008   EF 0%  . CATARACT EXTRACTION W/ INTRAOCULAR LENS  IMPLANT, BILATERAL Bilateral   . CORONARY ANGIOPLASTY  01/2003   Archie Endo 05/08/2011  . INGUINAL HERNIA REPAIR Right 03/2004   Archie Endo 05/08/2011  . INSERT / REPLACE / REMOVE PACEMAKER  11/28/2017  . KNEE ARTHROSCOPY Left 09/2011   Archie Endo 09/26/2011  . LOOP RECORDER INSERTION N/A 08/20/2017   Procedure: LOOP RECORDER INSERTION;  Surgeon: Sanda Klein, MD;  Location:  [facility];  Service: Cardiovascular;  Laterality: N/A;  . LOOP RECORDER REMOVAL  11/28/2017  . LOOP RECORDER REMOVAL N/A 11/28/2017   Procedure: LOOP RECORDER REMOVAL;  Surgeon: Sanda Klein, MD;  Location: [facility];  Service: Cardiovascular;  Laterality: N/A;  . PACEMAKER IMPLANT N/A 11/28/2017   Procedure: PACEMAKER IMPLANT - DUAL CHAMBER;  Surgeon: Sanda Klein, MD;  Location: [facility];  Service: Cardiovascular;  Laterality: N/A;  . PROSTATE BIOPSY  03/2008  . US ECHOCARDIOGRAPHY  08-04-2008   Est EF 55-60%    reports that he quit smoking about 39 years ago. His smoking use included cigarettes. He has a 38.00 pack-year smoking history. he has never used smokeless tobacco. He reports that he does not drink alcohol or use drugs. family history includes Aneurysm in his brother; Heart disease in his father and mother. Allergies  Allergen Reactions  . Ace Inhibitors Swelling  . Diltiazem Swelling and Palpitations     Review of Systems  Constitutional: Negative for activity change, appetite change, fatigue and fever.  HENT: Negative for congestion, ear pain and trouble swallowing.   Eyes: Negative for pain and visual disturbance.  Respiratory: Negative for cough, shortness of breath and wheezing.   Cardiovascular: Negative for chest pain and palpitations.  Gastrointestinal: Negative for abdominal distention, abdominal pain, blood in stool, constipation, diarrhea, nausea, rectal pain and vomiting.  Endocrine: Negative for polydipsia and polyuria.  Genitourinary: Negative for dysuria, hematuria and testicular pain.  Musculoskeletal: Negative for arthralgias and joint swelling.  Skin: Negative for rash.  Neurological: Negative for dizziness, syncope, weakness and headaches.  Hematological: Negative for adenopathy.  Psychiatric/Behavioral: Negative for agitation and dysphoric mood.       Objective:   Physical Exam  Constitutional: He is oriented to  person, place, and time. He appears well-developed and well-nourished.  HENT:  Right Ear: External ear normal.  Left Ear: External ear normal.  Mouth/Throat: Oropharynx is clear and moist.  Eyes: Pupils are equal, round, and reactive to light.  Neck: Neck supple. No thyromegaly present.  Cardiovascular: Normal rate and regular rhythm.  Pulmonary/Chest: Effort normal and breath sounds normal. No respiratory distress. He has no wheezes. He has no rales.  Abdominal: Soft. He exhibits no mass. There is no tenderness. There is no rebound and no guarding.  Musculoskeletal: He exhibits no edema.  Neurological: He is alert and oriented to person, place, and time.  Psychiatric: He has a normal mood and affect.       Assessment:     #1 hypertension stable and at goal  #2 history of tachybradycardia syndrome and atrial fibrillation now with pacemaker  #3 chronic anticoagulation  #4 BPH symptomatically stable     Plan:     -Patient will meet with our health coach for Medicare wellness visit -Immunizations up-to-date -Continue current medications -Consider home physical therapy for any recurrent falls or balance issues -Routine follow-up in 6 months and sooner as needed  Eulas Post MD [facility]

## 2017-12-26 NOTE — Patient Instructions (Addendum)
[name] , Thank you for taking time to come for your Medicare Wellness Visit. I appreciate your ongoing commitment to your health goals. Please review the following plan we discussed and let me know if I can assist you in the future.   Veterans want to access their VA benefits can call [facility]  [address] [place], [place] [address]  Phone: [phone] Or [phone] Fax: [phone]  OR   They can to to the [facility] office and call [phone] and If the prompt starts, hit 0 and then ask for eligibility. They will assist you to sign up for medical benefits.    These are the goals we discussed: Goals    . Patient Stated     Stay active and keep playing cards        This is a list of the screening recommended for you and due dates:  Health Maintenance  Topic Date Due  . Tetanus Vaccine  08/19/2027  . Flu Shot  Completed  . Pneumonia vaccines  Completed     Health Maintenance, Male A healthy lifestyle and preventive care is important for your health and wellness. Ask your health care provider about what schedule of regular examinations is right for you. What should I know about weight and diet? Eat a Healthy Diet  Eat plenty of vegetables, fruits, whole grains, low-fat dairy products, and lean protein.  Do not eat a lot of foods high in solid fats, added sugars, or salt.  Maintain a Healthy Weight Regular exercise can help you achieve or maintain a healthy weight. You should:  Do at least 150 minutes of exercise each week. The exercise should increase your heart rate and make you sweat (moderate-intensity exercise).  Do strength-training exercises at least twice a week.  Watch Your Levels of Cholesterol and Blood Lipids  Have your blood tested for lipids and cholesterol every 5 years starting at 82 years of age. If you are at high risk for heart disease, you should start having your blood tested when you are 82 years  old. You may need to have your cholesterol levels checked more often if: ? Your lipid or cholesterol levels are high. ? You are older than 82 years of age. ? You are at high risk for heart disease.  What should I know about cancer screening? Many types of cancers can be detected early and may often be prevented. Lung Cancer  You should be screened every year for lung cancer if: ? You are a current smoker who has smoked for at least 30 years. ? You are a former smoker who has quit within the past 15 years.  Talk to your health care provider about your screening options, when you should start screening, and how often you should be screened.  Colorectal Cancer  Routine colorectal cancer screening usually begins at 82 years of age and should be repeated every 5-10 years until you are 82 years old. You may need to be screened more often if early forms of precancerous polyps or small growths are found. Your health care provider may recommend screening at an earlier age if you have risk factors for colon cancer.  Your health care provider may recommend using home test kits to check for hidden blood in the stool.  A small camera at the end of a tube can be used to examine your colon (sigmoidoscopy or colonoscopy). This checks for the earliest forms of colorectal cancer.  Prostate and  Testicular Cancer  Depending on your age and overall health, your health care provider may do certain tests to screen for prostate and testicular cancer.  Talk to your health care provider about any symptoms or concerns you have about testicular or prostate cancer.  Skin Cancer  Check your skin from head to toe regularly.  Tell your health care provider about any new moles or changes in moles, especially if: ? There is a change in a mole's size, shape, or color. ? You have a mole that is larger than a pencil eraser.  Always use sunscreen. Apply sunscreen liberally and repeat throughout the day.  Protect  yourself by wearing long sleeves, pants, a wide-brimmed hat, and sunglasses when outside.  What should I know about heart disease, diabetes, and high blood pressure?  If you are 66-61 years of age, have your blood pressure checked every 3-5 years. If you are 57 years of age or older, have your blood pressure checked every year. You should have your blood pressure measured twice-once when you are at a hospital or clinic, and once when you are not at a hospital or clinic. Record the average of the two measurements. To check your blood pressure when you are not at a hospital or clinic, you can use: ? An automated blood pressure machine at a pharmacy. ? A home blood pressure monitor.  Talk to your health care provider about your target blood pressure.  If you are between 21-42 years old, ask your health care provider if you should take aspirin to prevent heart disease.  Have regular diabetes screenings by checking your fasting blood sugar level. ? If you are at a normal weight and have a low risk for diabetes, have this test once every three years after the age of 31. ? If you are overweight and have a high risk for diabetes, consider being tested at a younger age or more often.  A one-time screening for abdominal aortic aneurysm (AAA) by ultrasound is recommended for men aged 57-75 years who are current or former smokers. What should I know about preventing infection? Hepatitis B If you have a higher risk for hepatitis B, you should be screened for this virus. Talk with your health care provider to find out if you are at risk for hepatitis B infection. Hepatitis C Blood testing is recommended for:  Everyone born from [birth year] through 1963.  Anyone with known risk factors for hepatitis C.  Sexually Transmitted Diseases (STDs)  You should be screened each year for STDs including gonorrhea and chlamydia if: ? You are sexually active and are younger than 82 years of age. ? You are older than 82  years of age and your health care provider tells you that you are at risk for this type of infection. ? Your sexual activity has changed since you were last screened and you are at an increased risk for chlamydia or gonorrhea. Ask your health care provider if you are at risk.  Talk with your health care provider about whether you are at high risk of being infected with HIV. Your health care provider may recommend a prescription medicine to help prevent HIV infection.  What else can I do?  Schedule regular health, dental, and eye exams.  Stay current with your vaccines (immunizations).  Do not use any tobacco products, such as cigarettes, chewing tobacco, and e-cigarettes. If you need help quitting, ask your health care provider.  Limit alcohol intake to no more than 2 drinks per  day. One drink equals 12 ounces of beer, 5 ounces of wine, or 1 ounces of hard liquor.  Do not use street drugs.  Do not share needles.  Ask your health care provider for help if you need support or information about quitting drugs.  Tell your health care provider if you often feel depressed.  Tell your health care provider if you have ever been abused or do not feel safe at home. This information is not intended to replace advice given to you by your health care provider. Make sure you discuss any questions you have with your health care provider. Document Released: 06/08/2008 Document Revised: 08/09/2016 Document Reviewed: 09/14/2015 Elsevier Interactive Patient Education  2018 [place] in the Home Falls can cause injuries. They can happen to people of all ages. There are many things you can do to make your home safe and to help prevent falls. What can I do on the outside of my home?  Regularly fix the edges of walkways and driveways and fix any cracks.  Remove anything that might make you trip as you walk through a door, such as a raised step or threshold.  Trim any bushes or  trees on the path to your home.  Use bright outdoor lighting.  Clear any walking paths of anything that might make someone trip, such as rocks or tools.  Regularly check to see if handrails are loose or broken. Make sure that both sides of any steps have handrails.  Any raised decks and porches should have guardrails on the edges.  Have any leaves, snow, or ice cleared regularly.  Use sand or salt on walking paths during winter.  Clean up any spills in your garage right away. This includes oil or grease spills. What can I do in the bathroom?  Use night lights.  Install grab bars by the toilet and in the tub and shower. Do not use towel bars as grab bars.  Use non-skid mats or decals in the tub or shower.  If you need to sit down in the shower, use a plastic, non-slip stool.  Keep the floor dry. Clean up any water that spills on the floor as soon as it happens.  Remove soap buildup in the tub or shower regularly.  Attach bath mats securely with double-sided non-slip rug tape.  Do not have throw rugs and other things on the floor that can make you trip. What can I do in the bedroom?  Use night lights.  Make sure that you have a light by your bed that is easy to reach.  Do not use any sheets or blankets that are too big for your bed. They should not hang down onto the floor.  Have a firm chair that has side arms. You can use this for support while you get dressed.  Do not have throw rugs and other things on the floor that can make you trip. What can I do in the kitchen?  Clean up any spills right away.  Avoid walking on wet floors.  Keep items that you use a lot in easy-to-reach places.  If you need to reach something above you, use a strong step stool that has a grab bar.  Keep electrical cords out of the way.  Do not use floor polish or wax that makes floors slippery. If you must use wax, use non-skid floor wax.  Do not have throw rugs and other things on the  floor that can make you  trip. What can I do with my stairs?  Do not leave any items on the stairs.  Make sure that there are handrails on both sides of the stairs and use them. Fix handrails that are broken or loose. Make sure that handrails are as long as the stairways.  Check any carpeting to make sure that it is firmly attached to the stairs. Fix any carpet that is loose or worn.  Avoid having throw rugs at the top or bottom of the stairs. If you do have throw rugs, attach them to the floor with carpet tape.  Make sure that you have a light switch at the top of the stairs and the bottom of the stairs. If you do not have them, ask someone to add them for you. What else can I do to help prevent falls?  Wear shoes that: ? Do not have high heels. ? Have rubber bottoms. ? Are comfortable and fit you well. ? Are closed at the toe. Do not wear sandals.  If you use a stepladder: ? Make sure that it is fully opened. Do not climb a closed stepladder. ? Make sure that both sides of the stepladder are locked into place. ? Ask someone to hold it for you, if possible.  Clearly mark and make sure that you can see: ? Any grab bars or handrails. ? First and last steps. ? Where the edge of each step is.  Use tools that help you move around (mobility aids) if they are needed. These include: ? Canes. ? Walkers. ? Scooters. ? Crutches.  Turn on the lights when you go into a dark area. Replace any light bulbs as soon as they burn out.  Set up your furniture so you have a clear path. Avoid moving your furniture around.  If any of your floors are uneven, fix them.  If there are any pets around you, be aware of where they are.  Review your medicines with your doctor. Some medicines can make you feel dizzy. This can increase your chance of falling. Ask your doctor what other things that you can do to help prevent falls. This information is not intended to replace advice given to you by your  health care provider. Make sure you discuss any questions you have with your health care provider. Document Released: 10/07/2009 Document Revised: 05/18/2016 Document Reviewed: 01/15/2015 Elsevier Interactive Patient Education  [employer].

## 2018-02-20 ENCOUNTER — Other Ambulatory Visit: Payer: Self-pay | Admitting: *Deleted

## 2018-02-20 MED ORDER — HYDROCHLOROTHIAZIDE 12.5 MG PO TABS: 12.5000 mg | ORAL_TABLET | Freq: Every day | ORAL | 3 refills | Status: DC

## 2018-02-23 ENCOUNTER — Other Ambulatory Visit: Payer: Self-pay | Admitting: Family Medicine

## 2018-03-12 ENCOUNTER — Other Ambulatory Visit: Payer: Self-pay

## 2018-03-12 MED ORDER — POTASSIUM CHLORIDE CRYS ER 20 MEQ PO TBCR: 20.0000 meq | EXTENDED_RELEASE_TABLET | Freq: Every day | ORAL | 2 refills | Status: DC

## 2018-03-13 ENCOUNTER — Ambulatory Visit (INDEPENDENT_AMBULATORY_CARE_PROVIDER_SITE_OTHER): Payer: Medicare Other | Admitting: Cardiovascular Disease

## 2018-03-13 ENCOUNTER — Encounter: Payer: Self-pay | Admitting: Cardiovascular Disease

## 2018-03-13 VITALS — BP 130/72 | HR 64 | Ht 66.0 in | Wt 144.6 lb

## 2018-03-13 DIAGNOSIS — I480 Paroxysmal atrial fibrillation: Secondary | ICD-10-CM | POA: Diagnosis not present

## 2018-03-13 DIAGNOSIS — I10 Essential (primary) hypertension: Secondary | ICD-10-CM

## 2018-03-13 DIAGNOSIS — Z950 Presence of cardiac pacemaker: Secondary | ICD-10-CM | POA: Diagnosis not present

## 2018-03-13 DIAGNOSIS — E7800 Pure hypercholesterolemia, unspecified: Secondary | ICD-10-CM

## 2018-03-13 DIAGNOSIS — R4189 Other symptoms and signs involving cognitive functions and awareness: Secondary | ICD-10-CM

## 2018-03-13 DIAGNOSIS — I472 Ventricular tachycardia: Secondary | ICD-10-CM

## 2018-03-13 DIAGNOSIS — I441 Atrioventricular block, second degree: Secondary | ICD-10-CM | POA: Diagnosis not present

## 2018-03-13 DIAGNOSIS — I4729 Other ventricular tachycardia: Secondary | ICD-10-CM

## 2018-03-13 DIAGNOSIS — I495 Sick sinus syndrome: Secondary | ICD-10-CM

## 2018-03-13 NOTE — Progress Notes (Signed)
Cardiology Office Note:    Date:  03/13/2018   ID:  Joshua Burns, DOB [birth year], MRN [id number]  PCP:  Joshua Post, MD  Cardiologist:  Joshua Klein, MD    Referring MD: Joshua Post, MD   chief complaint: follow up after pacemaker implantation  History of Present Illness:    Joshua Burns is a 84 y.o. male with a hx of syncope due to sinus pauses,  paroxysmal atrial fibrillation with rapid ventricular response s/p dual-chamber permanent pacemaker implantation in December.  The site has healed well.  He has not had any syncope since pacemaker implantation.  He has never had stroke/TIA or other embolic events.    The patient specifically denies any chest pain at rest exertion, dyspnea at rest or with exertion, orthopnea, paroxysmal nocturnal dyspnea, syncope, palpitations, focal neurological deficits, intermittent claudication, lower extremity edema, unexplained weight gain, cough, hemoptysis or wheezing.  He has not had any bleeding complications.  Pacemaker interrogation shows normal device function.  Underlying rhythm today is sinus bradycardia at 37 bpm.  At that rate he is able to conduct 1: 1 across the AV node, but he developed second-degree AV block when atrial pacing at physiological rates.  He has 86% ventricular pacing at 77% atrial pacing since his last check.  Atrial fibrillation has not been recorded in the last 3 months, but he has had a couple of episodes of very brief nonsustained ventricular tachycardia.  He has treated hypertension and hyperlipidemia.  He has normal left ventricular systolic function and had a normal stress test in the remote past.    Past Medical History:  Diagnosis Date  . Alzheimer disease   . Arthritis    "knees" (11/28/2017)  . CAD (coronary artery disease)   . Glaucoma, both eyes   . HYPERLIPIDEMIA 01/28/2010  . HYPERTENSION 01/28/2010  . HYPERTROPHY PROSTATE W/UR OBST & OTH LUTS 01/28/2010  . Presence of permanent cardiac pacemaker  11/28/2017    Past Surgical History:  Procedure Laterality Date  . ANTERIOR CERVICAL DECOMP/DISCECTOMY FUSION  02/2003   Archie Endo 05/08/2011  . BRAIN SURGERY    . CARDIOVASCULAR STRESS TEST  08-04-2008   EF 0%  . CATARACT EXTRACTION W/ INTRAOCULAR LENS  IMPLANT, BILATERAL Bilateral   . CORONARY ANGIOPLASTY  01/2003   Archie Endo 05/08/2011  . INGUINAL HERNIA REPAIR Right 03/2004   Archie Endo 05/08/2011  . INSERT / REPLACE / REMOVE PACEMAKER  11/28/2017  . KNEE ARTHROSCOPY Left 09/2011   Archie Endo 09/26/2011  . LOOP RECORDER INSERTION N/A 08/20/2017   Procedure: LOOP RECORDER INSERTION;  Surgeon: Joshua Klein, MD;  Location: [facility];  Service: Cardiovascular;  Laterality: N/A;  . LOOP RECORDER REMOVAL  11/28/2017  . LOOP RECORDER REMOVAL N/A 11/28/2017   Procedure: LOOP RECORDER REMOVAL;  Surgeon: Joshua Klein, MD;  Location: [facility];  Service: Cardiovascular;  Laterality: N/A;  . PACEMAKER IMPLANT N/A 11/28/2017   Procedure: PACEMAKER IMPLANT - DUAL CHAMBER;  Surgeon: Joshua Klein, MD;  Location: [facility];  Service: Cardiovascular;  Laterality: N/A;  . PROSTATE BIOPSY  03/2008  . US ECHOCARDIOGRAPHY  08-04-2008   Est EF 55-60%    Current Medications: Current Meds  Medication Sig  . amLODipine-benazepril (LOTREL) 5-20 MG capsule Take 1 capsule by mouth daily.  Marland Kitchen apixaban (ELIQUIS) 2.5 MG TABS tablet Take 1 tablet (2.5 mg total) by mouth 2 (two) times daily.  Marland Kitchen donepezil (ARICEPT) 10 MG tablet Take 1 tablet (10 mg total) by mouth at  bedtime.  Marland Kitchen doxazosin (CARDURA) 4 MG tablet TAKE 1 TABLET BY MOUTH AT BEDTIME  . hydrochlorothiazide (HYDRODIURIL) 12.5 MG tablet Take 1 tablet (12.5 mg total) by mouth daily.  . metoprolol tartrate (LOPRESSOR) 25 MG tablet Take 1 tablet (25 mg total) by mouth 2 (two) times daily.  . potassium chloride SA (K-DUR,KLOR-CON) 20 MEQ tablet Take 1 tablet (20 mEq total) by mouth daily.  . simvastatin (ZOCOR) 20 MG tablet TAKE 1 TABLET BY  MOUTH EVERY DAY AT BEDTIME     Allergies:   Ace inhibitors and Diltiazem   Social History   Socioeconomic History  . Marital status: Widowed    Spouse name: None  . Number of children: 3  . Years of education: None  . Highest education level: None  Social Needs  . Financial resource strain: None  . Food insecurity - worry: None  . Food insecurity - inability: None  . Transportation needs - medical: None  . Transportation needs - non-medical: None  Occupational History    Employer: [employer]  Tobacco Use  . Smoking status: Former Smoker    Packs/day: 1.00    Years: 38.00    Pack years: 38.00    Types: Cigarettes    Last attempt to quit: 12/25/1978    Years since quitting: 39.2  . Smokeless tobacco: Never Used  Substance and Sexual Activity  . Alcohol use: No    Comment: does not   . Drug use: No  . Sexual activity: None  Other Topics Concern  . None  Social History Narrative  . None     Family History: The patient's family history includes Aneurysm in his brother; Heart disease in his father and mother. ROS:   Please see the history of present illness.     All other systems reviewed and are negative.  EKGs/Labs/Other Studies Reviewed:    EKG:  EKG is not ordered today.    Recent Labs: 08/18/2017: TSH 1.276 08/20/2017: ALT 7 11/28/2017: BUN 11; Creatinine, Ser 1.24; Hemoglobin 9.6; Platelets 73; Potassium 3.7; Sodium 137  Recent Lipid Panel    Component Value Date/Time   CHOL 107 08/18/2017 0842   TRIG 39 08/18/2017 0842   HDL 44 08/18/2017 0842   CHOLHDL 2.4 08/18/2017 0842   VLDL 8 08/18/2017 0842   LDLCALC 55 08/18/2017 0842    Physical Exam:    VS:  BP 130/72   Pulse 64   Ht 5\' 6"  (1.676 m)   Wt 144 lb 9.6 oz (65.6 kg)   SpO2 97%   BMI 23.34 kg/m     Wt Readings from Last 3 Encounters:  03/13/18 144 lb 9.6 oz (65.6 kg)  12/26/17 133 lb 8 oz (60.6 kg)  11/29/17 127 lb 13.9 oz (58 kg)     General: Alert, oriented x3, no distress, he is  quite lean and appears very fit for age Head: no evidence of trauma, PERRL, EOMI, no exophtalmos or lid lag, no myxedema, no xanthelasma; normal ears, nose and oropharynx Neck: normal jugular venous pulsations and no hepatojugular reflux; brisk carotid pulses without delay and no carotid bruits Chest: clear to auscultation, no signs of consolidation by percussion or palpation, normal fremitus, symmetrical and full respiratory excursions Cardiovascular: normal position and quality of the apical impulse, regular rhythm, normal first and paradoxically split second heart sounds, no murmurs, rubs or gallops, both the left subclavian pacemaker site and the explanted loop recorder site have healed well Abdomen: no tenderness or distention, no  masses by palpation, no abnormal pulsatility or arterial bruits, normal bowel sounds, no hepatosplenomegaly Extremities: no clubbing, cyanosis or edema; 2+ radial, ulnar and brachial pulses bilaterally; 2+ right femoral, posterior tibial and dorsalis pedis pulses; 2+ left femoral, posterior tibial and dorsalis pedis pulses; no subclavian or femoral bruits Neurological: grossly nonfocal Psych: Normal mood and affect   ASSESSMENT:    1. Paroxysmal atrial fibrillation (HCC)   2. Tachycardia-bradycardia syndrome ([place])   3. Second degree Mobitz I AV block   4. Pacemaker   5. Cognitive impairment   6. NSVT (nonsustained ventricular tachycardia) (HCC)   7. Pure hypercholesterolemia   8. Essential hypertension    PLAN:    In order of problems listed above:  1. PAFib: The arrhythmia was asymptomatic.  As documented on his implanted loop recorder, but has not recurred in the last 3 months.  He is on anticoagulation without bleeding complications.. CHADSVasc 3 (age 54, HTN). 2. SSS/Tachy-brady: No syncope since pacemaker implantation.  Histogram distribution appears reasonable for his activity level. 3. 2nd deg AVB: This is leading to a remarkably high right  ventricular pacing.  Made some programming changes to see if we can mitigate this, but doubtful we will have a big impact.  Thankfully no evidence of congestive heart failure.  He is on beta-blocker due to his previous episodes of relation rapid ventricular response as well as a newly detected VT. 4. PPM: Change to MVP today with a limit on the length of the PR interval.  Remote downloads every 3 months.  His son will monitor this via [email or web address]. 5. Dementia: After his pacemaker is implanted we restarted his donepezil. 6. NSVT:  brief, asymptomatic, questionable clinical significance.  On beta blocker.  Will monitor. 7. HLP: Excellent lipid profile on statin 8. HTN:  controlled   Medication Adjustments/Labs and Tests Ordered: Current medicines are reviewed at length with the patient today.  Concerns regarding medicines are outlined above.  No orders of the defined types were placed in this encounter.  No orders of the defined types were placed in this encounter.   Signed, Joshua Klein, MD  03/13/2018 3:01 PM    [place]

## 2018-03-13 NOTE — Patient Instructions (Signed)
Dr Sallyanne Kuster recommends that you continue on your current medications as directed. Please refer to the Current Medication list given to you today.  Remote monitoring is used to monitor your Pacemaker of ICD from home. This monitoring reduces the number of office visits required to check your device to one time per year. It allows [place] to keep an eye on the functioning of your device to ensure it is working properly. You are scheduled for a device check from home on Wednesday, June 19th, 2019. You may send your transmission at any time that day. If you have a wireless device, the transmission will be sent automatically. After your physician reviews your transmission, you will receive a postcard with your next transmission date.  Dr Sallyanne Kuster recommends that you schedule a follow-up appointment in 6 months with a pacemaker check. You will receive a reminder letter in the mail two months in advance. If you don't receive a letter, please call our office to schedule the follow-up appointment.  If you need a refill on your cardiac medications before your next appointment, please call your pharmacy.

## 2018-03-18 ENCOUNTER — Other Ambulatory Visit: Payer: Self-pay | Admitting: *Deleted

## 2018-03-18 MED ORDER — SIMVASTATIN 20 MG PO TABS: 20.0000 mg | ORAL_TABLET | Freq: Every day | ORAL | 1 refills | Status: DC

## 2018-05-06 ENCOUNTER — Other Ambulatory Visit: Payer: Self-pay | Admitting: Family Medicine

## 2018-06-05 LAB — CUP PACEART INCLINIC DEVICE CHECK
Implantable Lead Implant Date: 20181205
Implantable Lead Model: 5076
Implantable Lead Model: 5076
Implantable Pulse Generator Implant Date: 20181205
Lead Channel Setting Pacing Amplitude: 3.5 V
MDC IDC LEAD IMPLANT DT: 20181205
MDC IDC LEAD LOCATION: 753859
MDC IDC LEAD LOCATION: 753860
MDC IDC SESS DTM: 20190612171235
MDC IDC SET LEADCHNL RA PACING AMPLITUDE: 3.5 V
MDC IDC SET LEADCHNL RV PACING PULSEWIDTH: 0.4 ms
MDC IDC SET LEADCHNL RV SENSING SENSITIVITY: 0.9 mV

## 2018-06-12 ENCOUNTER — Ambulatory Visit (INDEPENDENT_AMBULATORY_CARE_PROVIDER_SITE_OTHER): Payer: Medicare Other | Admitting: *Deleted

## 2018-06-12 DIAGNOSIS — R55 Syncope and collapse: Secondary | ICD-10-CM

## 2018-06-12 DIAGNOSIS — I495 Sick sinus syndrome: Secondary | ICD-10-CM

## 2018-06-12 NOTE — Progress Notes (Signed)
Remote pacemaker transmission.   

## 2018-06-14 LAB — CUP PACEART REMOTE DEVICE CHECK
Battery Remaining Longevity: 137 mo
Battery Voltage: 3.05 V
Brady Statistic AP VP Percent: 54.96 %
Brady Statistic AP VS Percent: 25.08 %
Brady Statistic AS VP Percent: 3.26 %
Brady Statistic AS VS Percent: 16.7 %
Brady Statistic RV Percent Paced: 58.22 %
Implantable Lead Implant Date: 20181205
Implantable Lead Location: 753859
Implantable Lead Model: 5076
Implantable Pulse Generator Implant Date: 20181205
Lead Channel Impedance Value: 247 Ohm
Lead Channel Impedance Value: 323 Ohm
Lead Channel Impedance Value: 399 Ohm
Lead Channel Impedance Value: 494 Ohm
Lead Channel Pacing Threshold Amplitude: 0.5 V
Lead Channel Pacing Threshold Amplitude: 0.75 V
Lead Channel Sensing Intrinsic Amplitude: 5.375 mV
Lead Channel Sensing Intrinsic Amplitude: 5.375 mV
Lead Channel Setting Pacing Amplitude: 1.5 V
Lead Channel Setting Pacing Amplitude: 2.5 V
Lead Channel Setting Pacing Pulse Width: 0.4 ms
Lead Channel Setting Sensing Sensitivity: 0.9 mV
MDC IDC LEAD IMPLANT DT: 20181205
MDC IDC LEAD LOCATION: 753860
MDC IDC MSMT LEADCHNL RA PACING THRESHOLD PULSEWIDTH: 0.4 ms
MDC IDC MSMT LEADCHNL RA SENSING INTR AMPL: 1.875 mV
MDC IDC MSMT LEADCHNL RA SENSING INTR AMPL: 1.875 mV
MDC IDC MSMT LEADCHNL RV PACING THRESHOLD PULSEWIDTH: 0.4 ms
MDC IDC SESS DTM: 20190619052920
MDC IDC STAT BRADY RA PERCENT PACED: 80.1 %

## 2018-07-07 IMAGING — CT CT HEAD W/O CM
4 series · 16 of 47 positions shown, 18 images · non-contrast
Comparison: 08/18/2017

CLINICAL DATA: Altered level of consciousness, history
hypertension, coronary artery disease, Alzheimer's

EXAM:
CT HEAD WITHOUT CONTRAST
TECHNIQUE: Contiguous axial images were obtained from the base of the skull
through the vertex without intravenous contrast. Sagittal and
coronal MPR images reconstructed from axial data set.

[Series 2: head wo · axial · 0.43mm/px · z∈[-118,+2]mm · 7 of 34 slices shown, 9 images]
[im 5/34  brain]
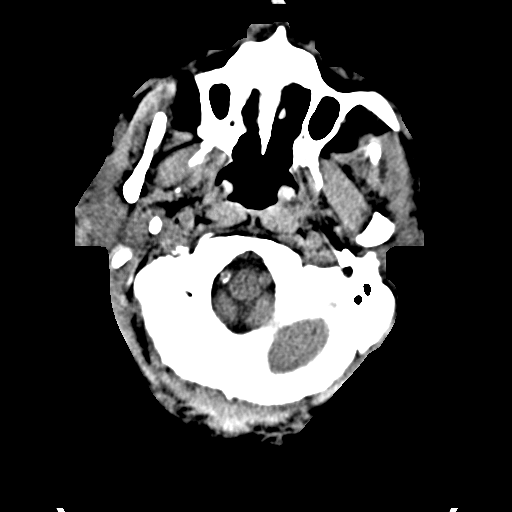
[im 5/34  bone]
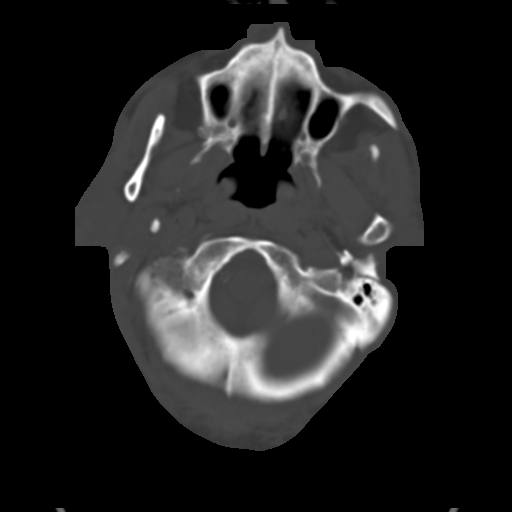
[im 9/34  brain]
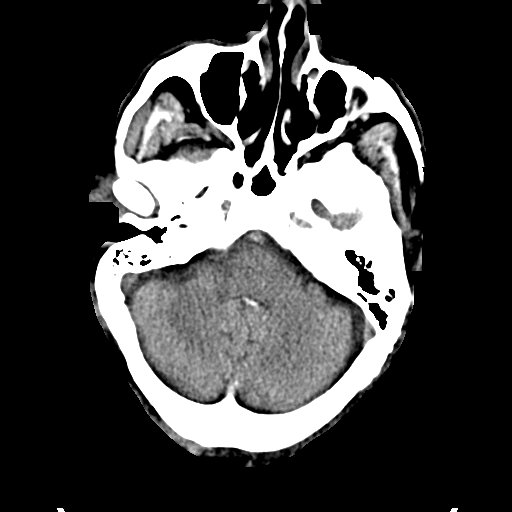
[im 13/34  brain]
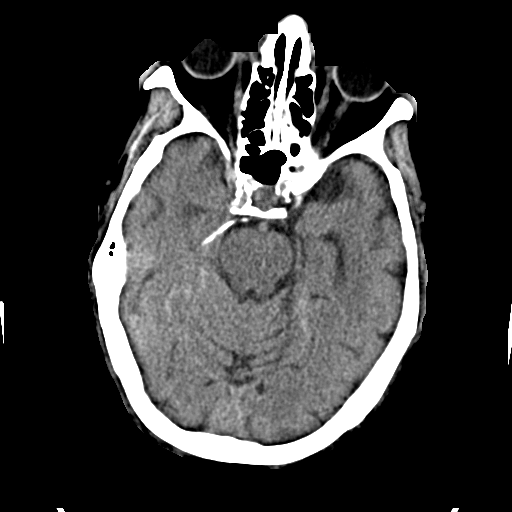
[im 17/34  brain]
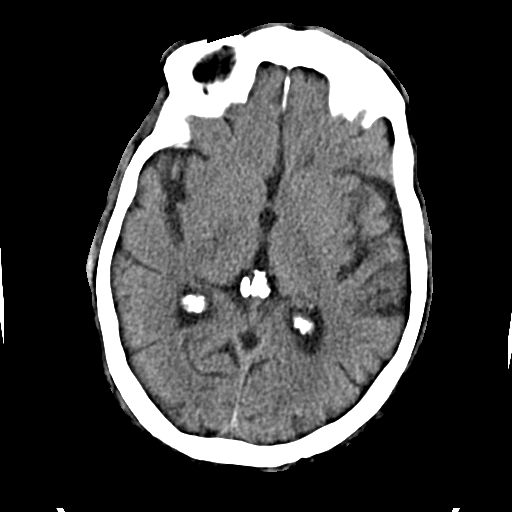
[im 21/34  brain]
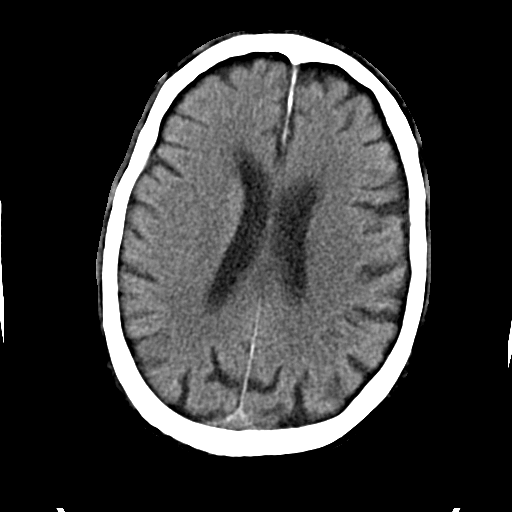
[im 21/34  bone]
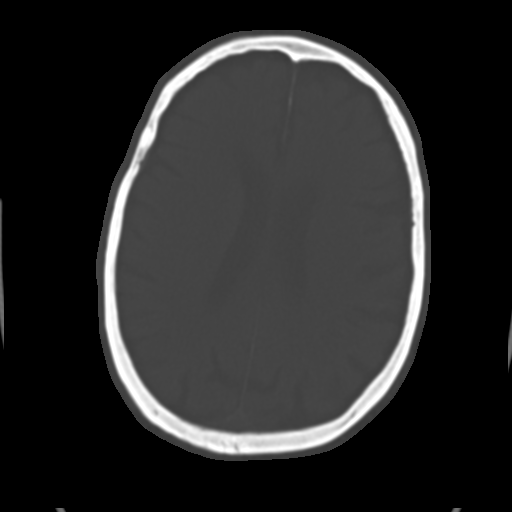
[im 25/34  brain]
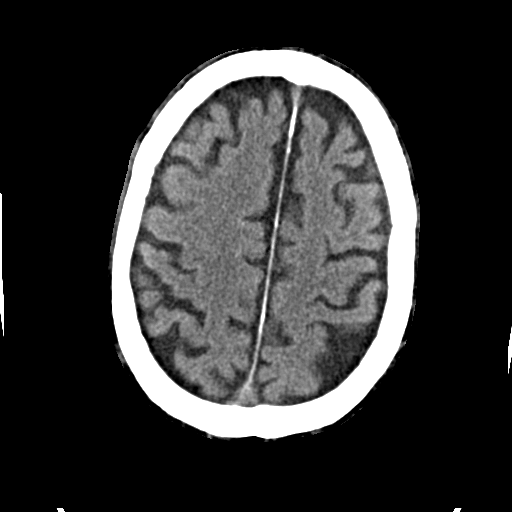
[im 29/34  brain]
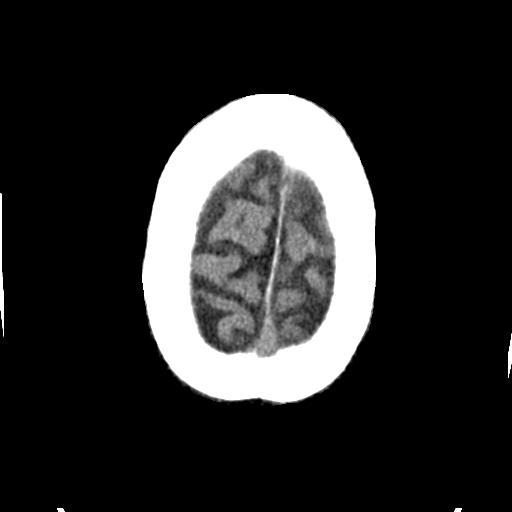

[Series 3: head bone · axial · 0.43mm/px · z∈[-122,-88]mm · 3 of 85 slices shown]
[im 9/85  bone]
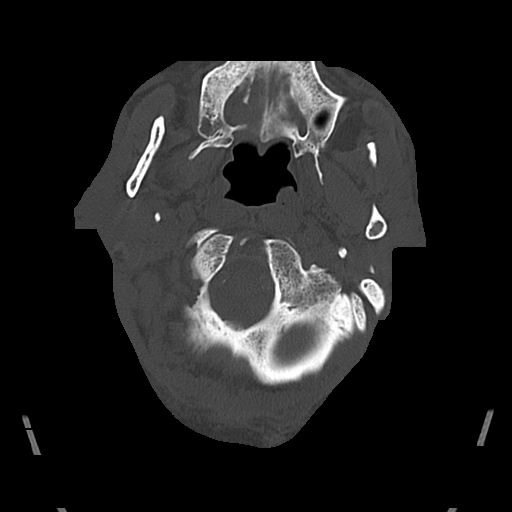
[im 17/85  bone]
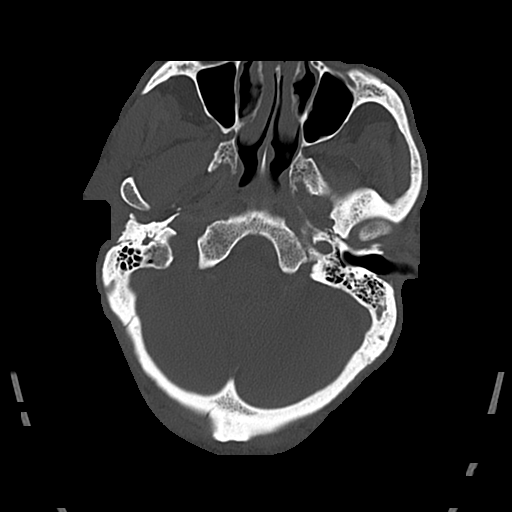
[im 26/85  bone]
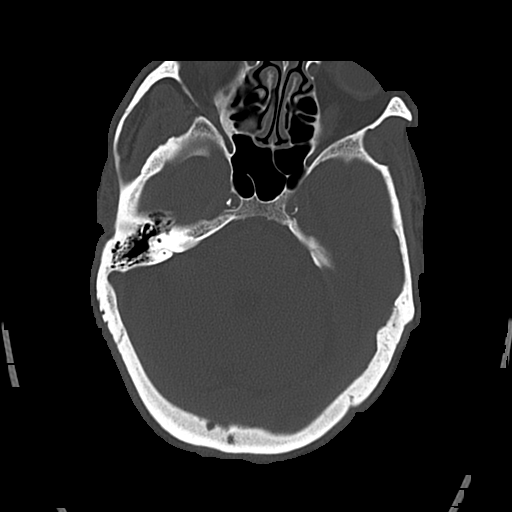

[Series 4: cor soft · coronal · 0.33mm/px · 3 of 71 slices shown]
[im 24/71  brain]
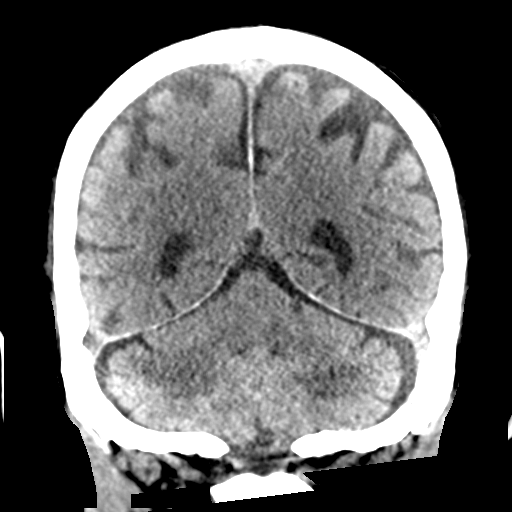
[im 32/71  brain]
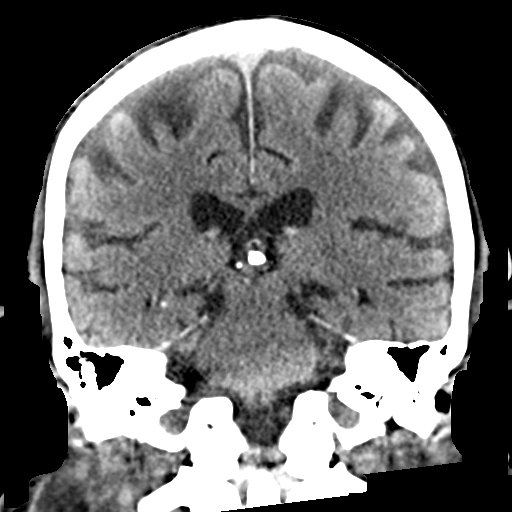
[im 39/71  brain]
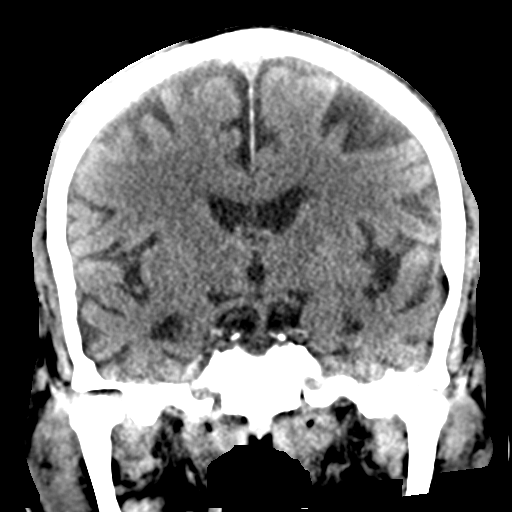

[Series 5: sag soft · sagittal · 0.33mm/px · 3 of 59 slices shown]
[im 20/59  brain]
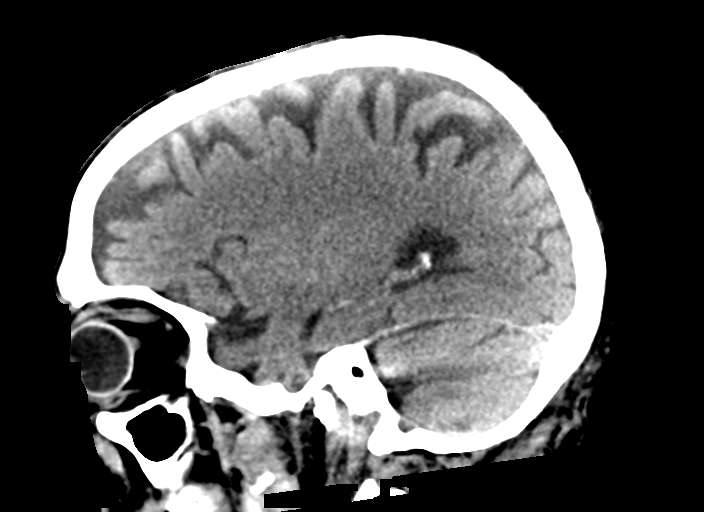
[im 30/59  brain]
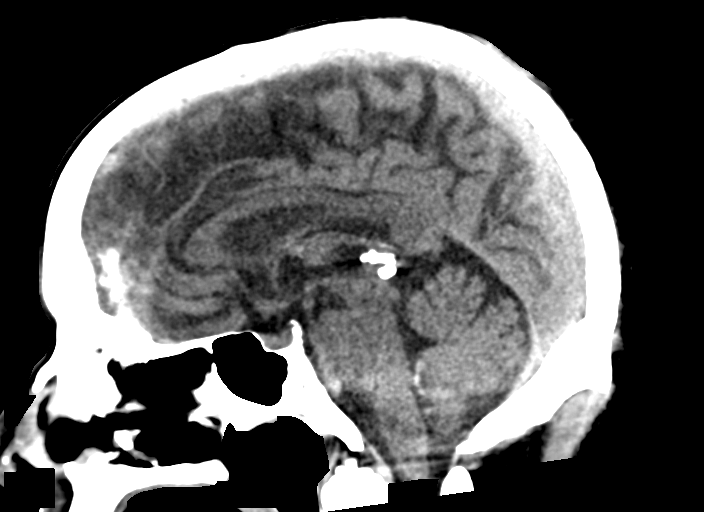
[im 39/59  brain]
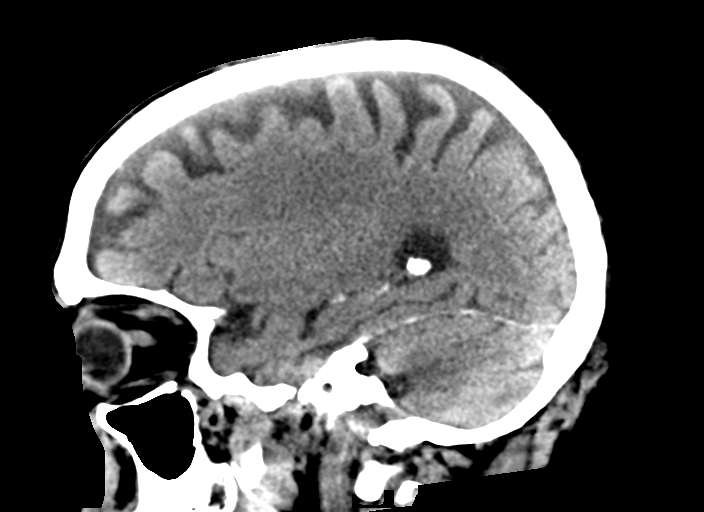

[16 of 47 positions shown; findings below may reference images not displayed]

FINDINGS: Brain: Generalized atrophy. Normal ventricular morphology. No
midline shift or mass effect. Minimal small vessel chronic ischemic
changes of deep cerebral white matter. Otherwise normal appearance
of brain parenchyma. No intracranial hemorrhage, mass lesion or
evidence acute infarction. No extra-axial fluid collections.

Vascular: Atherosclerotic calcification of internal carotid and
vertebral arteries at skullbase

Skull: Intact

Sinuses/Orbits: Clear

Other: N/A
IMPRESSION: Atrophy with minimal small vessel chronic ischemic changes of deep
cerebral white matter.

No acute intracranial abnormalities.

## 2018-07-07 IMAGING — DX DG CHEST 2V
2 series · 2 of 2 positions shown · non-contrast
Comparison: Chest radiograph dated 08/18/2017

CLINICAL DATA: [AGE] male with pacemaker placement.

EXAM:
CHEST  2 VIEW

[chest lat]
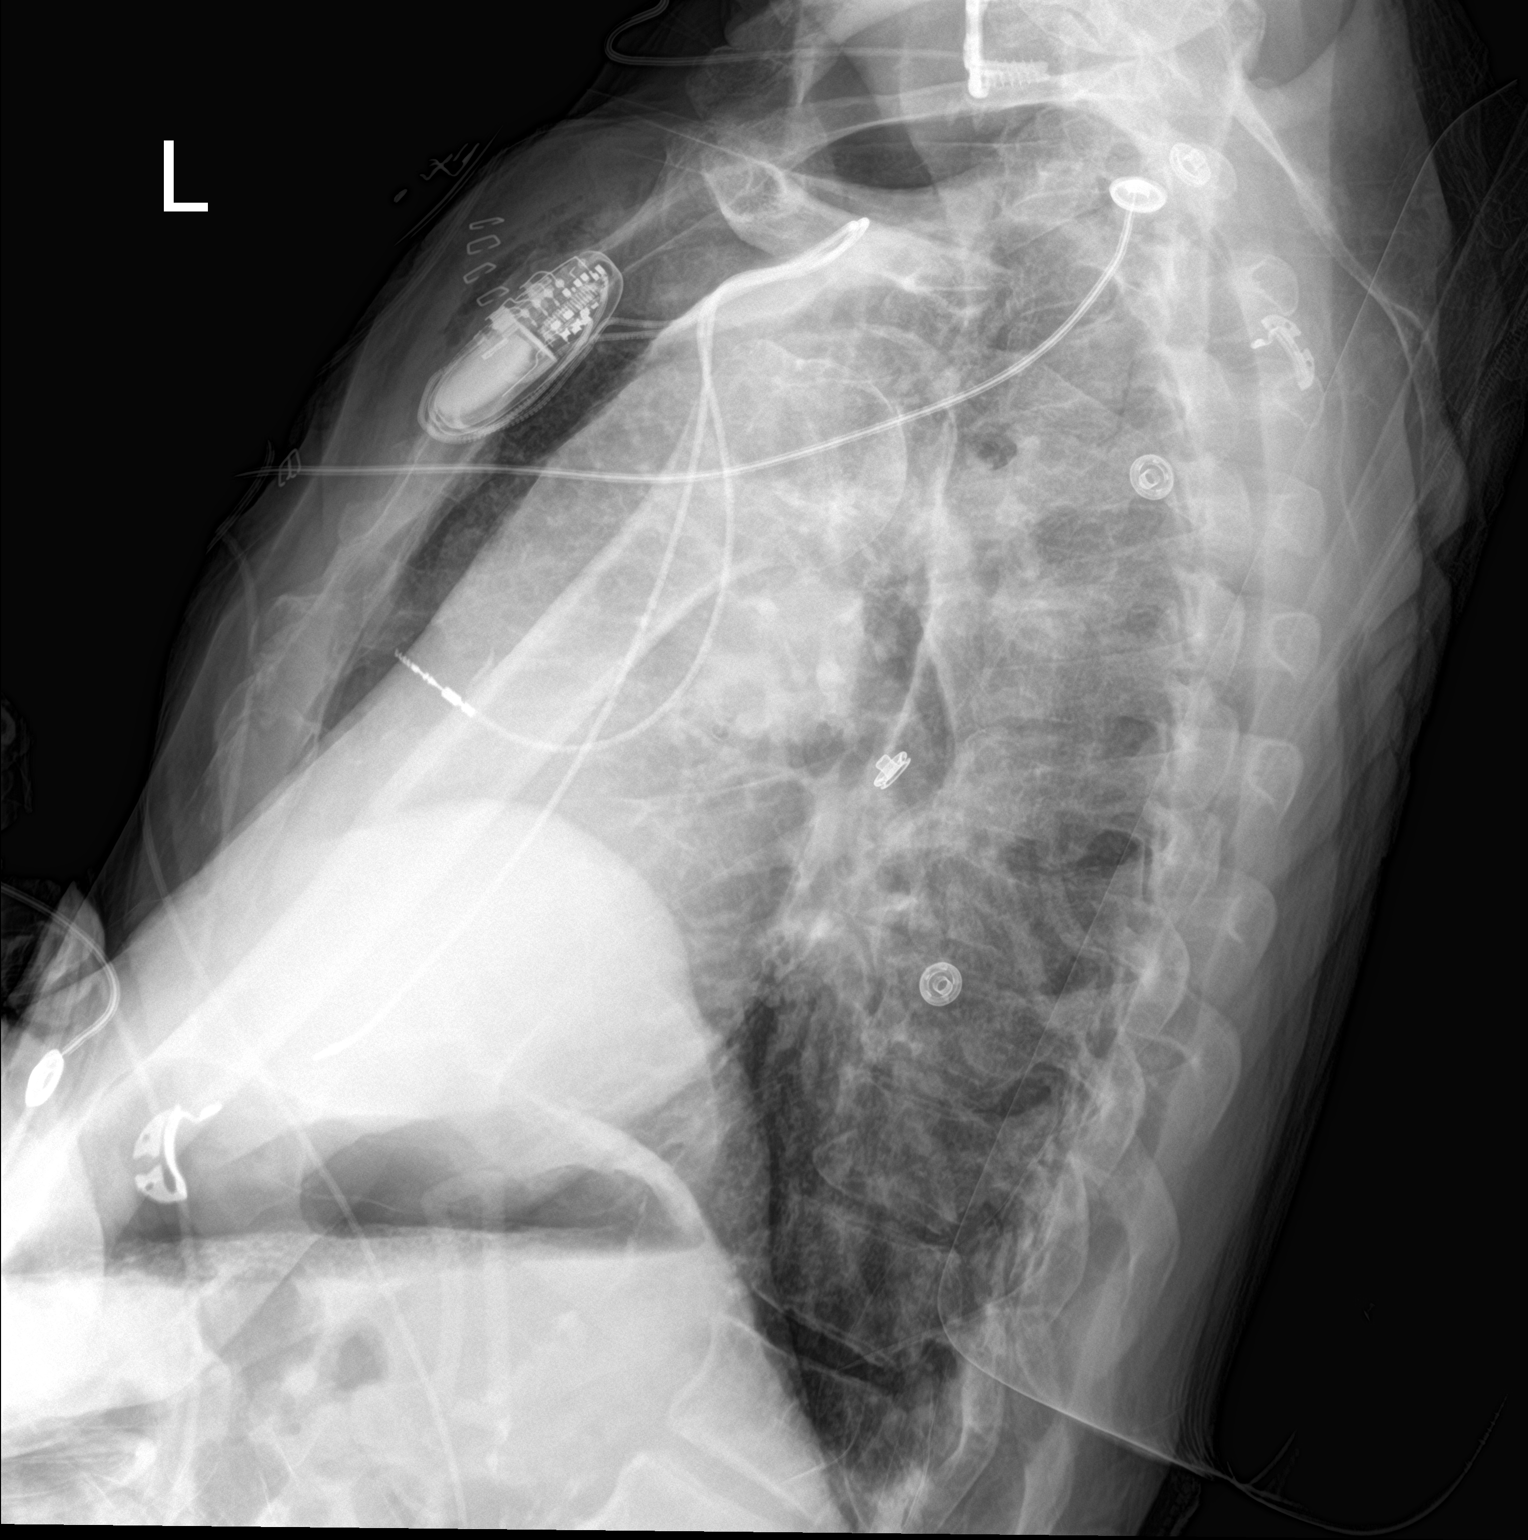

[chest ap]
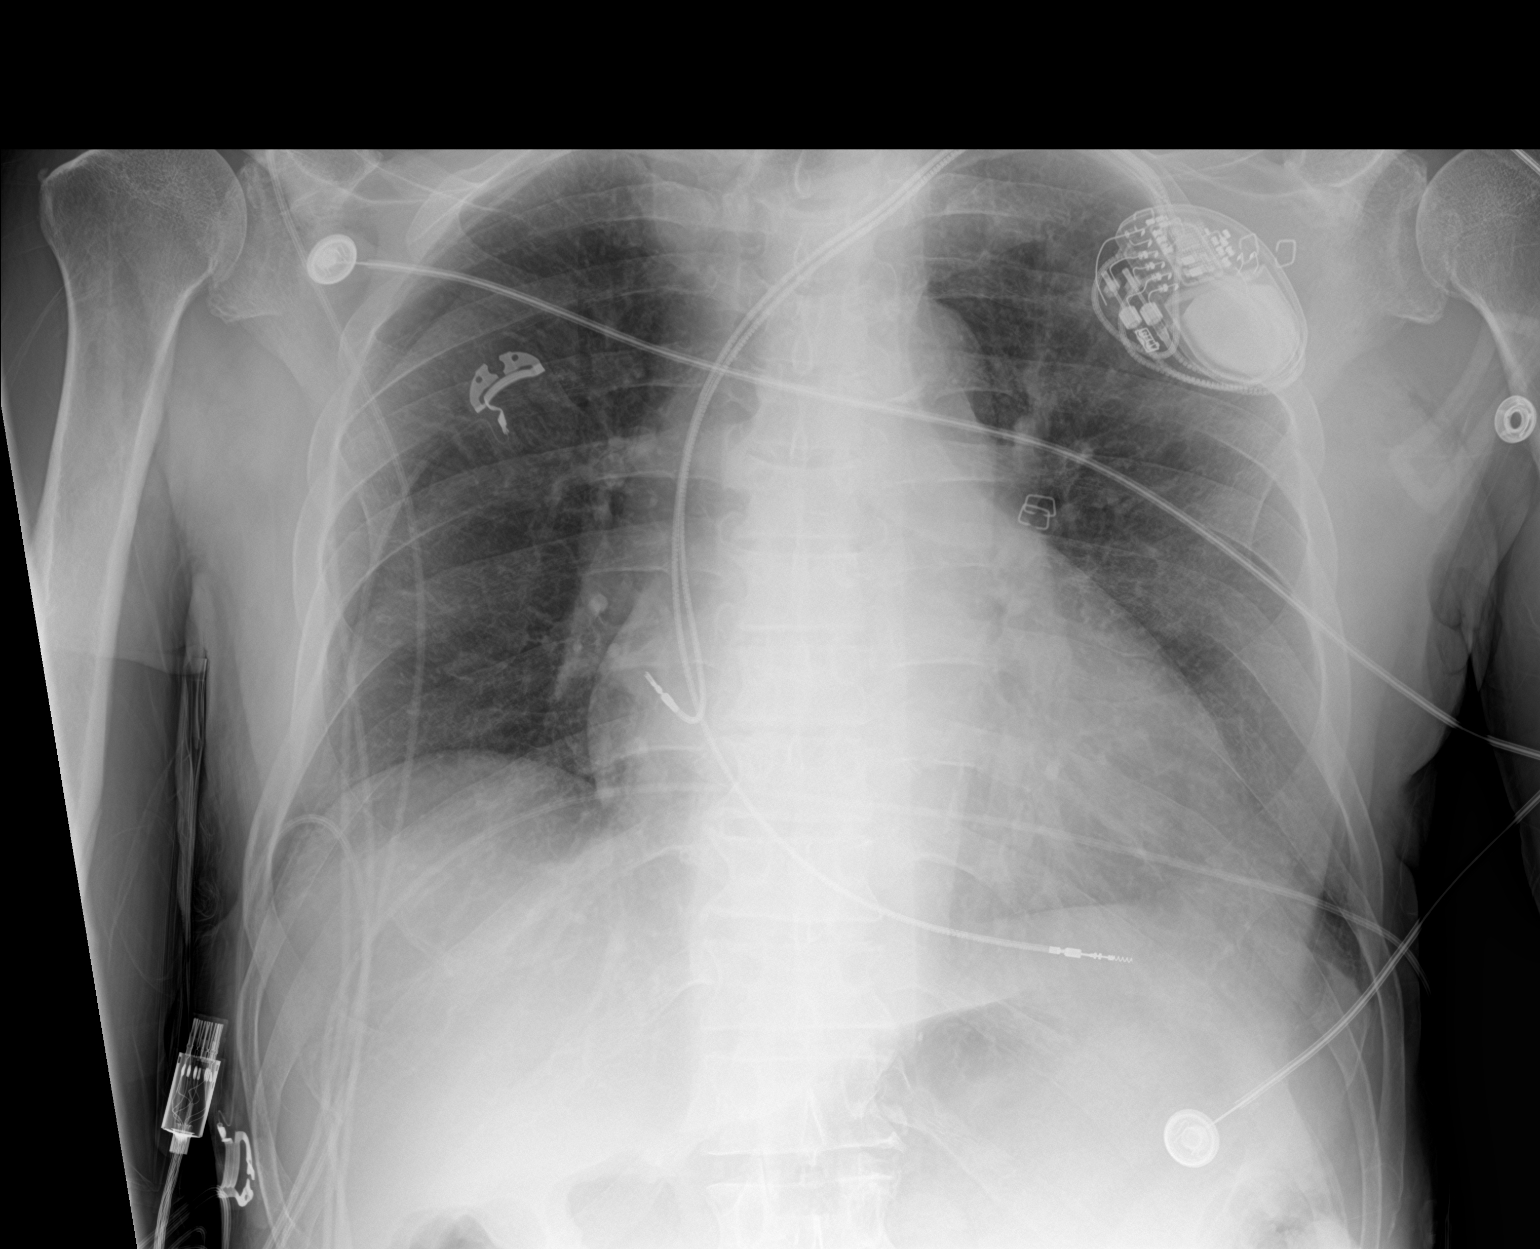

[2 of 2 positions shown; findings below may reference images not displayed]

FINDINGS: There are atelectatic changes of the left lung base. No focal
consolidation, pleural effusion, or pneumothorax. There is stable
mild cardiomegaly. A left pectoral dual lead pacemaker device noted
the pacemaker wires appear intact. Partially visualized lower
cervical fixation hardware. No acute osseous pathology. There is
atherosclerotic calcification of the aorta.
IMPRESSION: 1. No acute cardiopulmonary process.
2. Mild cardiomegaly.
3. Left pectoral dual lead pacemaker device with intact wires. No
pneumothorax.

## 2018-07-08 IMAGING — DX DG CHEST 2V
2 series · 2 of 2 positions shown · non-contrast
Comparison: Chest x-ray of November 28, 2017

CLINICAL DATA: The patient has undergone pacemaker replacement on
November 28, 2017

EXAM:
CHEST  2 VIEW

[chest pa]
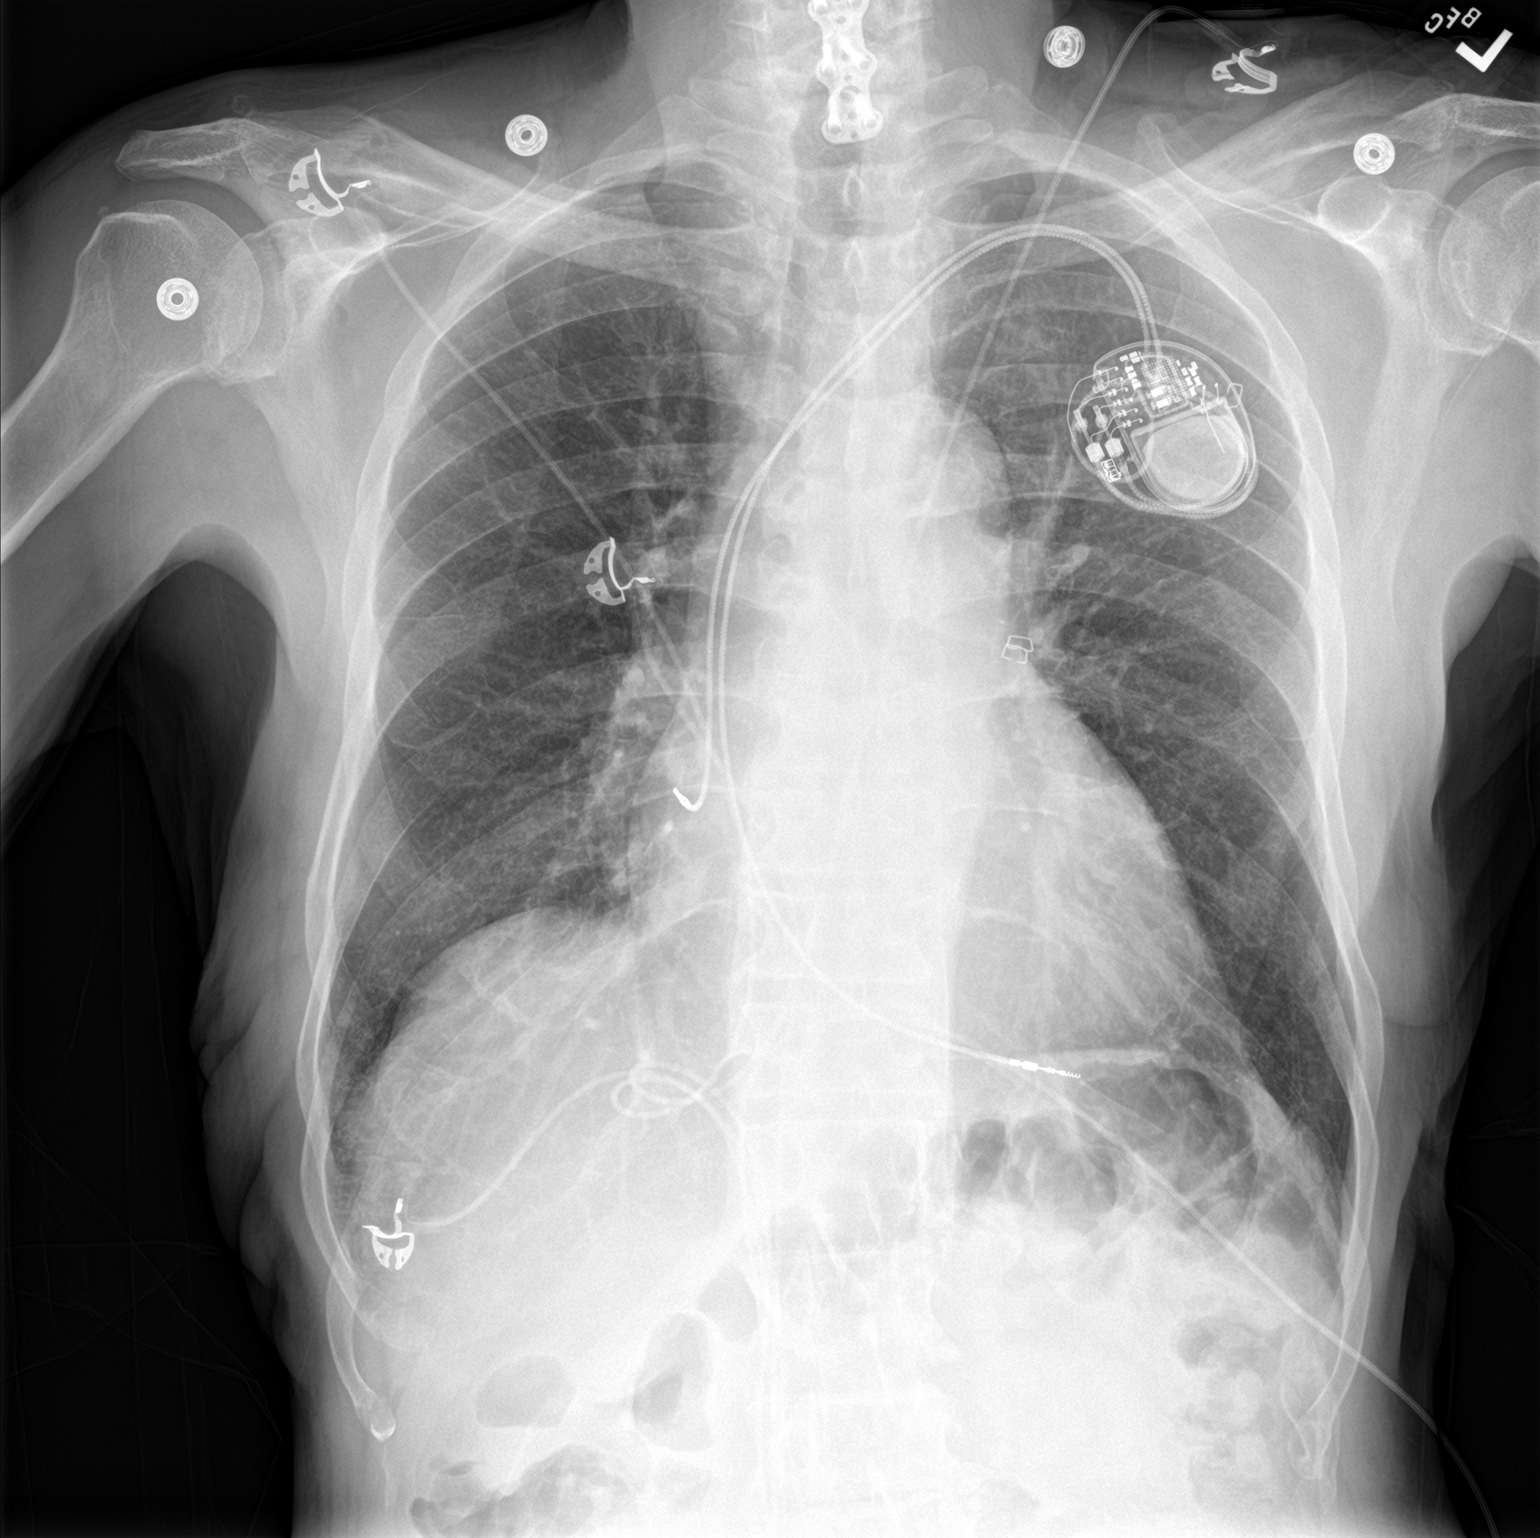

[chest lat]
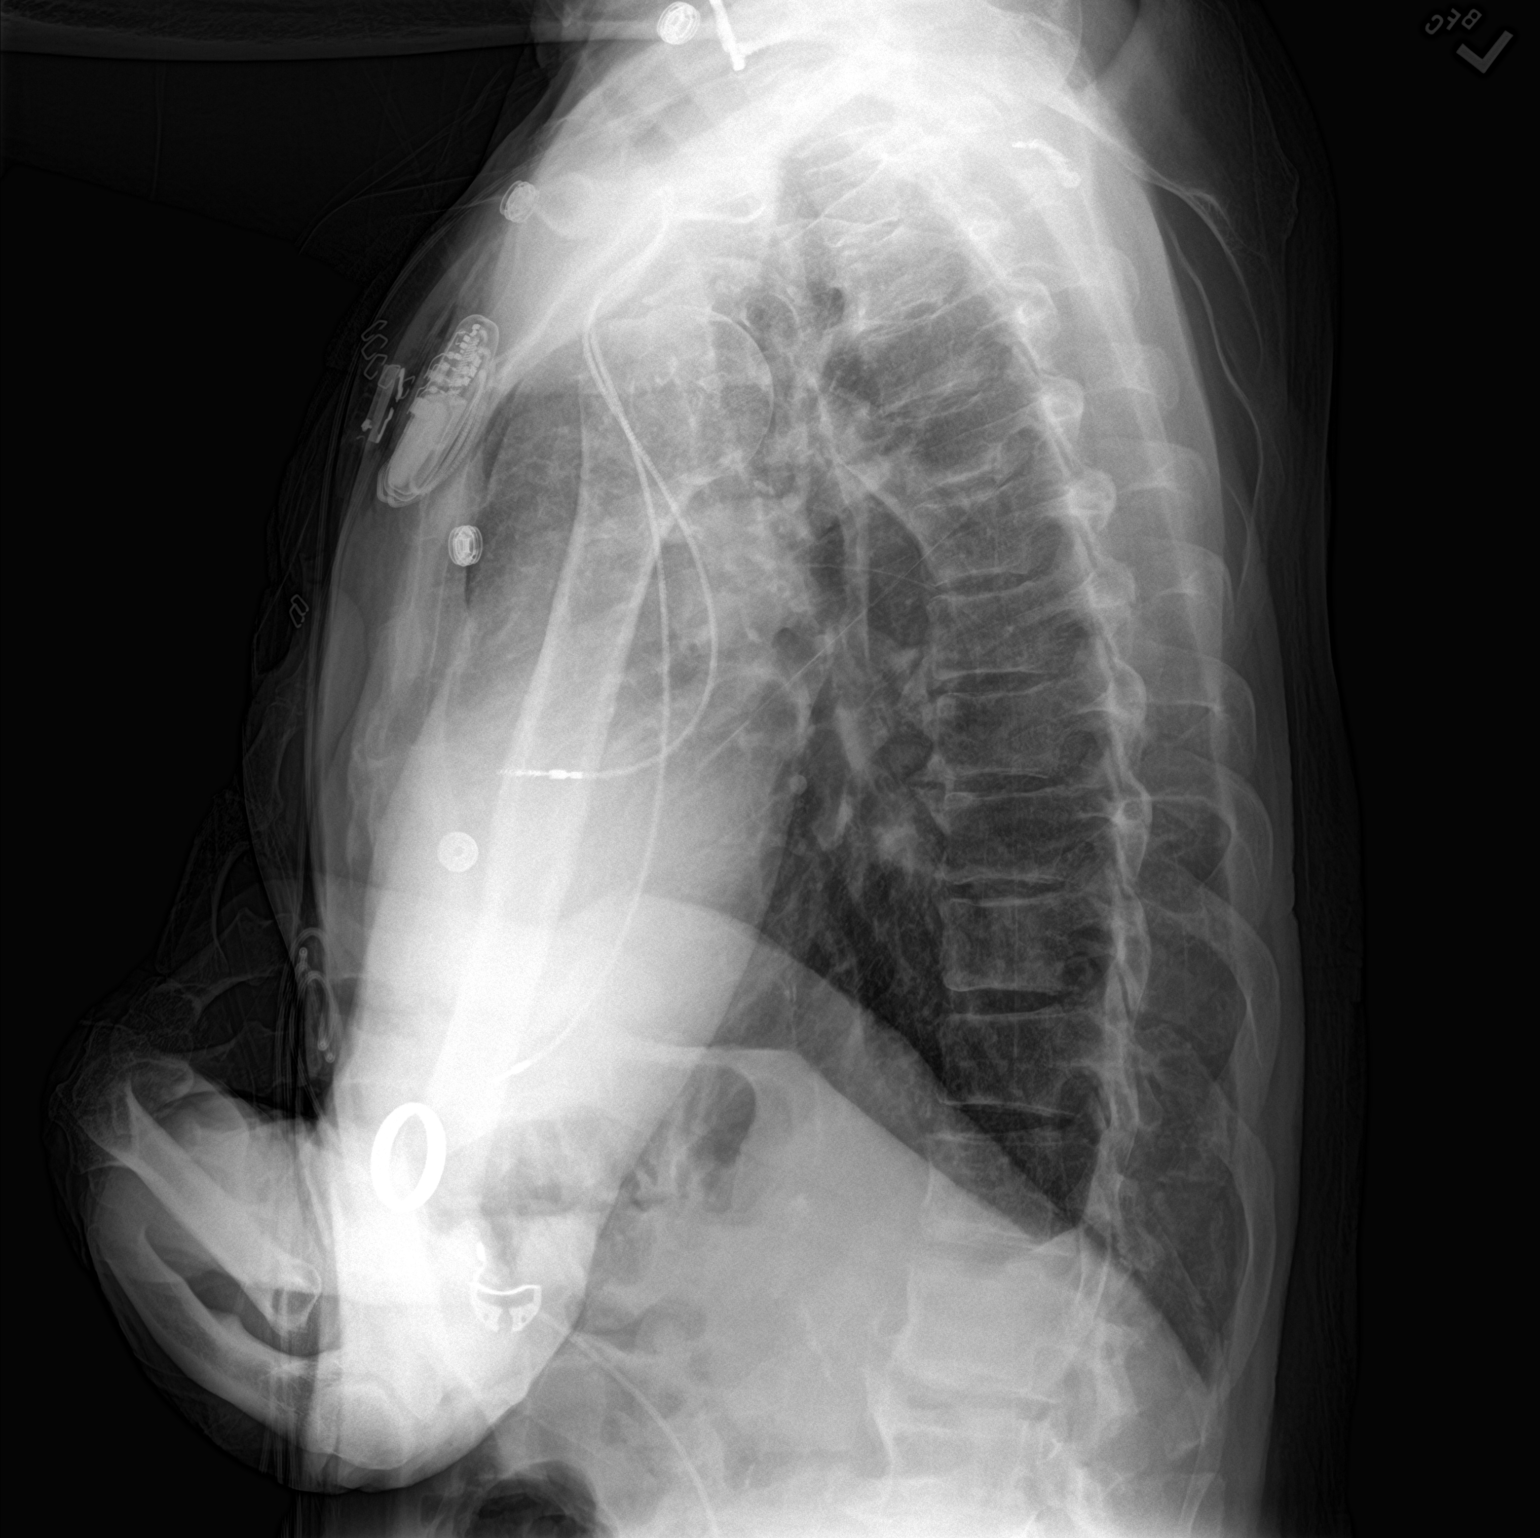

[2 of 2 positions shown; findings below may reference images not displayed]

FINDINGS: The lungs are well-expanded and clear. There is no pneumothorax. The
pacemaker general generator overlies the upper left pectoral region.
The electrodes are in reasonable position. The mediastinum is normal
in width. The heart and pulmonary vascularity are normal.
IMPRESSION: There is no active cardiopulmonary disease. No postprocedure
complication following pacemaker placement.

## 2018-09-11 ENCOUNTER — Ambulatory Visit (INDEPENDENT_AMBULATORY_CARE_PROVIDER_SITE_OTHER): Payer: Medicare Other | Admitting: *Deleted

## 2018-09-11 DIAGNOSIS — I495 Sick sinus syndrome: Secondary | ICD-10-CM

## 2018-09-11 DIAGNOSIS — R55 Syncope and collapse: Secondary | ICD-10-CM | POA: Diagnosis not present

## 2018-09-11 NOTE — Progress Notes (Signed)
Remote pacemaker transmission.   

## 2018-09-18 ENCOUNTER — Encounter: Payer: Self-pay | Admitting: Cardiovascular Disease

## 2018-09-18 ENCOUNTER — Encounter: Payer: Medicare Other | Admitting: Cardiovascular Disease

## 2018-09-18 ENCOUNTER — Ambulatory Visit (INDEPENDENT_AMBULATORY_CARE_PROVIDER_SITE_OTHER): Payer: Medicare Other | Admitting: Cardiovascular Disease

## 2018-09-18 VITALS — BP 138/66 | HR 80 | Ht 64.0 in | Wt 143.8 lb

## 2018-09-18 DIAGNOSIS — I480 Paroxysmal atrial fibrillation: Secondary | ICD-10-CM

## 2018-09-18 DIAGNOSIS — D649 Anemia, unspecified: Secondary | ICD-10-CM

## 2018-09-18 DIAGNOSIS — Z950 Presence of cardiac pacemaker: Secondary | ICD-10-CM | POA: Diagnosis not present

## 2018-09-18 DIAGNOSIS — Z7901 Long term (current) use of anticoagulants: Secondary | ICD-10-CM

## 2018-09-18 DIAGNOSIS — F0390 Unspecified dementia without behavioral disturbance: Secondary | ICD-10-CM | POA: Diagnosis not present

## 2018-09-18 DIAGNOSIS — I441 Atrioventricular block, second degree: Secondary | ICD-10-CM | POA: Diagnosis not present

## 2018-09-18 DIAGNOSIS — I10 Essential (primary) hypertension: Secondary | ICD-10-CM

## 2018-09-18 DIAGNOSIS — Z79899 Other long term (current) drug therapy: Secondary | ICD-10-CM

## 2018-09-18 DIAGNOSIS — I4729 Other ventricular tachycardia: Secondary | ICD-10-CM

## 2018-09-18 DIAGNOSIS — I495 Sick sinus syndrome: Secondary | ICD-10-CM | POA: Diagnosis not present

## 2018-09-18 DIAGNOSIS — I472 Ventricular tachycardia: Secondary | ICD-10-CM | POA: Diagnosis not present

## 2018-09-18 DIAGNOSIS — E785 Hyperlipidemia, unspecified: Secondary | ICD-10-CM

## 2018-09-18 NOTE — Patient Instructions (Addendum)
Dr Sallyanne Kuster recommends that you continue on your current medications as directed. Please refer to the Current Medication list given to you today.  Your physician recommends that you return for lab work in DECEMBER 2019.  Remote monitoring is used to monitor your Pacemaker or ICD from home. This monitoring reduces the number of office visits required to check your device to one time per year. It allows [place] to keep an eye on the functioning of your device to ensure it is working properly. You are scheduled for a device check from home on Wednesday, December 18th, 2019. You may send your transmission at any time that day. If you have a wireless device, the transmission will be sent automatically. After your physician reviews your transmission, you will receive a notification with your next transmission date.  To improve our patient care and to more adequately follow your device, [facility] has decided, as a practice, to start following each patient four times a year with your home monitor. This means that you may experience a remote appointment that is close to an in-office appointment with your physician. Your insurance will apply at the same rate as other remote monitoring transmissions.  Dr Sallyanne Kuster recommends that you schedule a follow-up appointment in 12 months with a pacemaker check. You will receive a reminder letter in the mail two months in advance. If you don't receive a letter, please call our office to schedule the follow-up appointment.  If you need a refill on your cardiac medications before your next appointment, please call your pharmacy.

## 2018-09-18 NOTE — Progress Notes (Signed)
Cardiology Office Note:    Date:  09/20/2018   ID:  Joshua Burns, DOB 1925-02-24, MRN [id number]  PCP:  Eulas Post, MD  Cardiologist:  Sanda Klein, MD    Referring MD: Eulas Post, MD   chief complaint: follow up after pacemaker implantation  History of Present Illness:    Joshua Burns is a 84 y.o. male with a hx of syncope due to sinus pauses,  paroxysmal atrial fibrillation with rapid ventricular response s/p dual-chamber permanent pacemaker implantation in December 2018.Marland Kitchen  He has not had any syncope since pacemaker implantation.  He has never had stroke/TIA or other embolic events.    The patient specifically denies any chest pain at rest or with exertion, dyspnea at rest or with exertion, orthopnea, paroxysmal nocturnal dyspnea, syncope, palpitations, focal neurological deficits, intermittent claudication, lower extremity edema, unexplained weight gain, cough, hemoptysis or wheezing.  He has not had any problems and reports compliance with Eliquis.   Pacemaker interrogation shows normal device function.  The underlying rhythm is severe sinus bradycardia in 30s.  Presenting rhythm is atrial paced, ventricular paced at 80 bpm.  He has 70% atrial pacing and 56% ventricular pacing.  Underlying rhythm today is sinus bradycardia at 37 bpm.  He is capable of AV conduction one-to-one at very low rates..  Atrial fibrillation has not been recorded since pacemaker implantation's, but he has frequent episodes of paroxysmal atrial tachycardia lasting for less than a minute, less than 0.1%.  He has not had any ventricular tachycardia.  He has treated hypertension and hyperlipidemia.  He has normal left ventricular systolic function and had a normal stress test in the remote past.    Past Medical History:  Diagnosis Date  . Alzheimer disease   . Arthritis    "knees" (11/28/2017)  . CAD (coronary artery disease)   . Glaucoma, both eyes   . HYPERLIPIDEMIA 01/28/2010  . HYPERTENSION  01/28/2010  . HYPERTROPHY PROSTATE W/UR OBST & OTH LUTS 01/28/2010  . Presence of permanent cardiac pacemaker 11/28/2017    Past Surgical History:  Procedure Laterality Date  . ANTERIOR CERVICAL DECOMP/DISCECTOMY FUSION  02/2003   Archie Endo 05/08/2011  . BRAIN SURGERY    . CARDIOVASCULAR STRESS TEST  08-04-2008   EF 0%  . CATARACT EXTRACTION W/ INTRAOCULAR LENS  IMPLANT, BILATERAL Bilateral   . CORONARY ANGIOPLASTY  01/2003   Archie Endo 05/08/2011  . INGUINAL HERNIA REPAIR Right 03/2004   Archie Endo 05/08/2011  . INSERT / REPLACE / REMOVE PACEMAKER  11/28/2017  . KNEE ARTHROSCOPY Left 09/2011   Archie Endo 09/26/2011  . LOOP RECORDER INSERTION N/A 08/20/2017   Procedure: LOOP RECORDER INSERTION;  Surgeon: Sanda Klein, MD;  Location: [facility];  Service: Cardiovascular;  Laterality: N/A;  . LOOP RECORDER REMOVAL  11/28/2017  . LOOP RECORDER REMOVAL N/A 11/28/2017   Procedure: LOOP RECORDER REMOVAL;  Surgeon: Sanda Klein, MD;  Location: [place] [facility];  Service: Cardiovascular;  Laterality: N/A;  . PACEMAKER IMPLANT N/A 11/28/2017   Procedure: PACEMAKER IMPLANT - DUAL CHAMBER;  Surgeon: Sanda Klein, MD;  Location: [facility] [facility];  Service: Cardiovascular;  Laterality: N/A;  . PROSTATE BIOPSY  03/2008  . US ECHOCARDIOGRAPHY  08-04-2008   Est EF 55-60%    Current Medications: Current Meds  Medication Sig  . amLODipine-benazepril (LOTREL) 5-20 MG capsule Take 1 capsule by mouth daily.  Marland Kitchen apixaban (ELIQUIS) 2.5 MG TABS tablet Take 1 tablet (2.5 mg total) by mouth 2 (two) times daily.  Marland Kitchen  donepezil (ARICEPT) 10 MG tablet TAKE 1 TABLET BY MOUTH EVERYDAY AT BEDTIME  . doxazosin (CARDURA) 4 MG tablet TAKE 1 TABLET BY MOUTH AT BEDTIME  . hydrochlorothiazide (HYDRODIURIL) 12.5 MG tablet Take 1 tablet (12.5 mg total) by mouth daily.  . metoprolol tartrate (LOPRESSOR) 25 MG tablet Take 1 tablet (25 mg total) by mouth 2 (two) times daily.  . potassium chloride SA (K-DUR,KLOR-CON) 20 MEQ  tablet Take 1 tablet (20 mEq total) by mouth daily.  . simvastatin (ZOCOR) 20 MG tablet Take 1 tablet (20 mg total) by mouth at bedtime.     Allergies:   Ace inhibitors and Diltiazem   Social History   Socioeconomic History  . Marital status: Widowed    Spouse name: Not on file  . Number of children: 3  . Years of education: Not on file  . Highest education level: Not on file  Occupational History    Employer: [employer]  Social Needs  . Financial resource strain: Not on file  . Food insecurity:    Worry: Not on file    Inability: Not on file  . Transportation needs:    Medical: Not on file    Non-medical: Not on file  Tobacco Use  . Smoking status: Former Smoker    Packs/day: 1.00    Years: 38.00    Pack years: 38.00    Types: Cigarettes    Last attempt to quit: 12/25/1978    Years since quitting: 39.7  . Smokeless tobacco: Never Used  Substance and Sexual Activity  . Alcohol use: No    Comment: does not   . Drug use: No  . Sexual activity: Not on file  Lifestyle  . Physical activity:    Days per week: Not on file    Minutes per session: Not on file  . Stress: Not on file  Relationships  . Social connections:    Talks on phone: Not on file    Gets together: Not on file    Attends religious service: Not on file    Active member of club or organization: Not on file    Attends meetings of clubs or organizations: Not on file    Relationship status: Not on file  Other Topics Concern  . Not on file  Social History Narrative  . Not on file     Family History: The patient's family history includes Aneurysm in his brother; Heart disease in his father and mother. ROS:   Please see the history of present illness.    All other systems are reviewed and are negative  EKGs/Labs/Other Studies Reviewed:    EKG:  EKG is ordered today.  It shows atrial paced, ventricular paced rhythm at 80 bpm, paced QRS 144 ms, QTC 478 ms  Recent Labs: 11/28/2017: BUN 11;  Creatinine, Ser 1.24; Hemoglobin 9.6; Platelets 73; Potassium 3.7; Sodium 137  Recent Lipid Panel    Component Value Date/Time   CHOL 107 08/18/2017 0842   TRIG 39 08/18/2017 0842   HDL 44 08/18/2017 0842   CHOLHDL 2.4 08/18/2017 0842   VLDL 8 08/18/2017 0842   LDLCALC 55 08/18/2017 0842    Physical Exam:    VS:  BP 138/66 (BP Location: Left Arm, Patient Position: Sitting, Cuff Size: Normal)   Pulse 80   Ht 5\' 4"  (1.626 m)   Wt 143 lb 12.8 oz (65.2 kg)   BMI 24.68 kg/m     Wt Readings from Last 3 Encounters:  09/18/18 143  lb 12.8 oz (65.2 kg)  03/13/18 144 lb 9.6 oz (65.6 kg)  12/26/17 133 lb 8 oz (60.6 kg)     General: Alert, oriented x3, no distress, lean, appears very fit for 84 year old Head: no evidence of trauma, PERRL, EOMI, no exophtalmos or lid lag, no myxedema, no xanthelasma; normal ears, nose and oropharynx Neck: normal jugular venous pulsations and no hepatojugular reflux; brisk carotid pulses without delay and no carotid bruits Chest: clear to auscultation, no signs of consolidation by percussion or palpation, normal fremitus, symmetrical and full respiratory excursions Cardiovascular: normal position and quality of the apical impulse, regular rhythm, normal first and second heart sounds, no murmurs, rubs or gallops Abdomen: no tenderness or distention, no masses by palpation, no abnormal pulsatility or arterial bruits, normal bowel sounds, no hepatosplenomegaly Extremities: no clubbing, cyanosis or edema; 2+ radial, ulnar and brachial pulses bilaterally; 2+ right femoral, posterior tibial and dorsalis pedis pulses; 2+ left femoral, posterior tibial and dorsalis pedis pulses; no subclavian or femoral bruits Neurological: grossly nonfocal Psych: Normal mood and affect Well-healed left subclavian pacemaker site  ASSESSMENT:    1. Paroxysmal atrial fibrillation (HCC)   2. Tachycardia-bradycardia syndrome ([place])   3. Second degree Mobitz I AV block   4. Pacemaker     5. Dementia without behavioral disturbance, unspecified dementia type ([place])   6. NSVT (nonsustained ventricular tachycardia) (HCC)   7. Dyslipidemia   8. Essential hypertension   9. Long term (current) use of anticoagulants   10. Anemia, unspecified type   11. Medication management    PLAN:    In order of problems listed above:  1. PAFib: This was documented on his implantable loop recorder, but has not recurred since pacemaker implantation.  He is on anticoagulation without bleeding complications.. CHADSVasc 3 (age 46, HTN). 2. SSS/Tachy-brady: No syncope since pacemaker implantation.  Heart rate histograms appropriate.  No sensor settings necessary. 3. 2nd deg AVB: After the pacemaker setting changes at his last appointment the overall burden of ventricular pacing has decreased somewhat from about 80% to 50%, but he has clear evidence of second-degree AV block.  Thankfully no evidence of congestive heart failure.  He is on beta-blocker due to history of atrial fibrillation with rapid ventricular response and NSVT. 4. PPM: Normal device function.  Continue remote downloads every 3 months.  His son will monitor this via [email or web address]. 5. Dementia: After his pacemaker is implanted we restarted his donepezil.  Stable cognitive status 6. NSVT:  brief, asymptomatic, questionable clinical significance.  On beta blocker.  None seen since the last pacemaker visit 7. HLP: Excellent lipid profile on statin 8. HTN: Control is acceptable, albeit imperfect 9. Eliquis: No overt bleeding complications 10. Anemia: Reevaluate since he is on anticoagulants.  Hemoglobin around 10 without microcytosis with accompanying thrombocytopenia suggesting possible bone marrow conduction disorder.   Medication Adjustments/Labs and Tests Ordered: Current medicines are reviewed at length with the patient today.  Concerns regarding medicines are outlined above.  Orders Placed This Encounter  Procedures  . CBC  .  Comprehensive metabolic panel  . Lipid panel  . EKG 12-Lead   No orders of the defined types were placed in this encounter.   Signed, Sanda Klein, MD  09/20/2018 3:52 PM    [place]

## 2018-09-19 ENCOUNTER — Ambulatory Visit (INDEPENDENT_AMBULATORY_CARE_PROVIDER_SITE_OTHER): Payer: Medicare Other

## 2018-09-19 DIAGNOSIS — Z23 Encounter for immunization: Secondary | ICD-10-CM

## 2018-09-20 DIAGNOSIS — Z23 Encounter for immunization: Secondary | ICD-10-CM

## 2018-09-28 ENCOUNTER — Other Ambulatory Visit: Payer: Self-pay | Admitting: Family Medicine

## 2018-10-04 LAB — CUP PACEART REMOTE DEVICE CHECK
Battery Remaining Longevity: 132 mo
Battery Voltage: 3.03 V
Brady Statistic AP VP Percent: 50.55 %
Brady Statistic AS VS Percent: 22.33 %
Brady Statistic RA Percent Paced: 75.62 %
Brady Statistic RV Percent Paced: 53.06 %
Implantable Lead Implant Date: 20181205
Implantable Lead Implant Date: 20181205
Implantable Lead Location: 753860
Implantable Lead Model: 5076
Implantable Pulse Generator Implant Date: 20181205
Lead Channel Impedance Value: 247 Ohm
Lead Channel Impedance Value: 304 Ohm
Lead Channel Impedance Value: 380 Ohm
Lead Channel Pacing Threshold Amplitude: 0.5 V
Lead Channel Pacing Threshold Amplitude: 0.875 V
Lead Channel Pacing Threshold Pulse Width: 0.4 ms
Lead Channel Pacing Threshold Pulse Width: 0.4 ms
Lead Channel Sensing Intrinsic Amplitude: 2 mV
Lead Channel Sensing Intrinsic Amplitude: 2 mV
Lead Channel Setting Pacing Amplitude: 1.5 V
MDC IDC LEAD LOCATION: 753859
MDC IDC MSMT LEADCHNL RV IMPEDANCE VALUE: 399 Ohm
MDC IDC MSMT LEADCHNL RV SENSING INTR AMPL: 6.25 mV
MDC IDC MSMT LEADCHNL RV SENSING INTR AMPL: 6.25 mV
MDC IDC SESS DTM: 20190918111057
MDC IDC SET LEADCHNL RV PACING AMPLITUDE: 2.5 V
MDC IDC SET LEADCHNL RV PACING PULSEWIDTH: 0.4 ms
MDC IDC SET LEADCHNL RV SENSING SENSITIVITY: 0.9 mV
MDC IDC STAT BRADY AP VS PERCENT: 24.61 %
MDC IDC STAT BRADY AS VP PERCENT: 2.51 %

## 2018-12-03 ENCOUNTER — Ambulatory Visit (INDEPENDENT_AMBULATORY_CARE_PROVIDER_SITE_OTHER): Payer: Medicare Other | Admitting: Physician Assistant

## 2018-12-03 ENCOUNTER — Ambulatory Visit (INDEPENDENT_AMBULATORY_CARE_PROVIDER_SITE_OTHER): Payer: Medicare Other

## 2018-12-03 ENCOUNTER — Encounter: Payer: Self-pay | Admitting: Physician Assistant

## 2018-12-03 VITALS — BP 140/70 | HR 85 | Temp 98.7°F | Ht 64.0 in | Wt 139.0 lb

## 2018-12-03 DIAGNOSIS — M5442 Lumbago with sciatica, left side: Secondary | ICD-10-CM

## 2018-12-03 DIAGNOSIS — R319 Hematuria, unspecified: Secondary | ICD-10-CM | POA: Diagnosis not present

## 2018-12-03 DIAGNOSIS — M47816 Spondylosis without myelopathy or radiculopathy, lumbar region: Secondary | ICD-10-CM | POA: Diagnosis not present

## 2018-12-03 LAB — POCT URINALYSIS DIPSTICK
Bilirubin, UA: NEGATIVE
GLUCOSE UA: NEGATIVE
Leukocytes, UA: NEGATIVE
NITRITE UA: NEGATIVE
PROTEIN UA: POSITIVE — AB
Urobilinogen, UA: 1 E.U./dL
pH, UA: 6 (ref 5.0–8.0)

## 2018-12-03 NOTE — Patient Instructions (Addendum)
Push fluids -- you are dehydrated!!  We will send your urine off for culture. I want it re-checked in one week --> please see your PCP for this.  Schedule your tylenol. Take 1000 mg every 8 hours. Also, consider purchasing Lidocaine patches to use on your back.    If any worsening symptoms, please go to the ER for pain management. I recommend considering follow-up with [facility] for further work-up if symptoms persist despite treatment.

## 2018-12-03 NOTE — Progress Notes (Signed)
Joshua Burns is a 84 y.o. male here for a new problem.  I acted as a Education administrator for [employer], PA-C Anselmo Pickler, LPN  History of Present Illness:   Chief Complaint  Patient presents with  . Back Pain   He has known history of cervical spondylolysis which he has seen [facility] orthopedics for in the past.  Back Pain  This is a new problem. Episode onset: Started a week ago. The problem occurs constantly. The problem has been gradually worsening since onset. The pain is present in the lumbar spine. Radiates to: down whole left leg. The pain is at a severity of 9/10. The pain is severe. The pain is worse during the day. The symptoms are aggravated by bending, position and twisting. Associated symptoms include leg pain and weakness. Pertinent negatives include no dysuria, fever, headaches, numbness or tingling. He has tried nothing for the symptoms.   He states that he doesn't drink much water throughout the day. Denies: saddle anesthesia, uncontrolled bowel/bladder  Past Medical History:  Diagnosis Date  . Alzheimer disease ([place])   . Arthritis    "knees" (11/28/2017)  . CAD (coronary artery disease)   . Glaucoma, both eyes   . HYPERLIPIDEMIA 01/28/2010  . HYPERTENSION 01/28/2010  . HYPERTROPHY PROSTATE W/UR OBST & OTH LUTS 01/28/2010  . Presence of permanent cardiac pacemaker 11/28/2017     Social History   Socioeconomic History  . Marital status: Widowed    Spouse name: Not on file  . Number of children: 3  . Years of education: Not on file  . Highest education level: Not on file  Occupational History    Employer: [employer]  Social Needs  . Financial resource strain: Not on file  . Food insecurity:    Worry: Not on file    Inability: Not on file  . Transportation needs:    Medical: Not on file    Non-medical: Not on file  Tobacco Use  . Smoking status: Former Smoker    Packs/day: 1.00    Years: 38.00    Pack years: 38.00    Types: Cigarettes    Last attempt  to quit: 12/25/1978    Years since quitting: 39.9  . Smokeless tobacco: Never Used  Substance and Sexual Activity  . Alcohol use: No    Comment: does not   . Drug use: No  . Sexual activity: Not on file  Lifestyle  . Physical activity:    Days per week: Not on file    Minutes per session: Not on file  . Stress: Not on file  Relationships  . Social connections:    Talks on phone: Not on file    Gets together: Not on file    Attends religious service: Not on file    Active member of club or organization: Not on file    Attends meetings of clubs or organizations: Not on file    Relationship status: Not on file  . Intimate partner violence:    Fear of current or ex partner: Not on file    Emotionally abused: Not on file    Physically abused: Not on file    Forced sexual activity: Not on file  Other Topics Concern  . Not on file  Social History Narrative  . Not on file    Past Surgical History:  Procedure Laterality Date  . ANTERIOR CERVICAL DECOMP/DISCECTOMY FUSION  02/2003   Archie Endo 05/08/2011  . BRAIN SURGERY    . CARDIOVASCULAR  STRESS TEST  08-04-2008   EF 0%  . CATARACT EXTRACTION W/ INTRAOCULAR LENS  IMPLANT, BILATERAL Bilateral   . CORONARY ANGIOPLASTY  01/2003   Archie Endo 05/08/2011  . INGUINAL HERNIA REPAIR Right 03/2004   Archie Endo 05/08/2011  . INSERT / REPLACE / REMOVE PACEMAKER  11/28/2017  . KNEE ARTHROSCOPY Left 09/2011   Archie Endo 09/26/2011  . LOOP RECORDER INSERTION N/A 08/20/2017   Procedure: LOOP RECORDER INSERTION;  Surgeon: Sanda Klein, MD;  Location: [place];  Service: Cardiovascular;  Laterality: N/A;  . LOOP RECORDER REMOVAL  11/28/2017  . LOOP RECORDER REMOVAL N/A 11/28/2017   Procedure: LOOP RECORDER REMOVAL;  Surgeon: Sanda Klein, MD;  Location: [place] [facility];  Service: Cardiovascular;  Laterality: N/A;  . PACEMAKER IMPLANT N/A 11/28/2017   Procedure: PACEMAKER IMPLANT - DUAL CHAMBER;  Surgeon: Sanda Klein, MD;  Location: [place]  [facility];  Service: Cardiovascular;  Laterality: N/A;  . PROSTATE BIOPSY  03/2008  . US ECHOCARDIOGRAPHY  08-04-2008   Est EF 55-60%    Family History  Problem Relation Age of Onset  . Heart disease Mother   . Heart disease Father   . Aneurysm Brother     Allergies  Allergen Reactions  . Ace Inhibitors Swelling  . Diltiazem Swelling and Palpitations    Current Medications:   Current Outpatient Medications:  .  amLODipine-benazepril (LOTREL) 5-20 MG capsule, Take 1 capsule by mouth daily., Disp: , Rfl:  .  apixaban (ELIQUIS) 2.5 MG TABS tablet, Take 1 tablet (2.5 mg total) by mouth 2 (two) times daily., Disp: 180 tablet, Rfl: 3 .  donepezil (ARICEPT) 10 MG tablet, TAKE 1 TABLET BY MOUTH EVERYDAY AT BEDTIME, Disp: 90 tablet, Rfl: 1 .  doxazosin (CARDURA) 4 MG tablet, TAKE 1 TABLET BY MOUTH AT BEDTIME, Disp: 90 tablet, Rfl: 3 .  hydrochlorothiazide (HYDRODIURIL) 12.5 MG tablet, Take 1 tablet (12.5 mg total) by mouth daily., Disp: 90 tablet, Rfl: 3 .  metoprolol tartrate (LOPRESSOR) 25 MG tablet, Take 1 tablet (25 mg total) by mouth 2 (two) times daily., Disp: 90 tablet, Rfl: 3 .  potassium chloride SA (K-DUR,KLOR-CON) 20 MEQ tablet, Take 1 tablet (20 mEq total) by mouth daily., Disp: 90 tablet, Rfl: 2 .  simvastatin (ZOCOR) 20 MG tablet, TAKE 1 TABLET BY MOUTH EVERYDAY AT BEDTIME, Disp: 90 tablet, Rfl: 0   Review of Systems:   Review of Systems  Constitutional: Negative for fever.  Genitourinary: Negative for dysuria.  Musculoskeletal: Positive for back pain.  Neurological: Positive for weakness. Negative for tingling, numbness and headaches.    Vitals:   Vitals:   12/03/18 1451  BP: 140/70  Pulse: 85  Temp: 98.7 F (37.1 C)  TempSrc: Oral  SpO2: 98%  Weight: 139 lb (63 kg)  Height: 5\' 4"  (1.626 m)     Body mass index is 23.86 kg/m.  Physical Exam:   Physical Exam  Constitutional: He appears well-developed. He is cooperative.  Non-toxic appearance. He does not  have a sickly appearance. He does not appear ill. No distress.  Cardiovascular: Normal rate, regular rhythm, S1 normal, S2 normal, normal heart sounds and normal pulses.  No LE edema  Pulmonary/Chest: Effort normal and breath sounds normal.  Musculoskeletal:  Decreased ROM 2/2 pain with flexion/extension, lateral side bends, and rotation. Reproducible tenderness with deep palpation to bilateral paraspinal lumbar muscles and lower lumbar spine. No evidence of erythema, rash or ecchymosis.   Neurological: He is alert. GCS eye subscore is 4. GCS  verbal subscore is 5. GCS motor subscore is 6.  Reflex Scores:      Patellar reflexes are 2+ on the right side and 2+ on the left side. Normal sensation in bilateral LE  Skin: Skin is warm, dry and intact.  Psychiatric: He has a normal mood and affect. His speech is normal and behavior is normal.  Nursing note and vitals reviewed.  Results for orders placed or performed in visit on 12/03/18  POCT urinalysis dipstick  Result Value Ref Range   Color, UA Yellow    Clarity, UA Clear    Glucose, UA Negative Negative   Bilirubin, UA Negative    Ketones, UA Moderate (2+)    Spec Grav, UA >=1.030 (A) 1.010 - 1.025   Blood, UA Moderate (2+)    pH, UA 6.0 5.0 - 8.0   Protein, UA Positive (A) Negative   Urobilinogen, UA 1.0 0.2 or 1.0 E.U./dL   Nitrite, UA Negative    Leukocytes, UA Negative Negative   Appearance     Odor     Lumbar xray: no acute issues, likely DDD and chronic changes, official xray pending  Assessment and Plan:   Quientin was seen today for back pain.  Diagnoses and all orders for this visit:  Acute bilateral low back pain with left-sided sciatica No red flags on exam. Start scheduled tylenol 1000 mg TID. Lidocaine patches to area. Follow-up with either [facility] or Dr. Teresa Coombs in our office for further work up. Patient and grandson agreeable to plan. Xray read pending. -     DG Lumbar Spine Complete; Future -      POCT urinalysis dipstick  Hematuria, unspecified type UA shows dehydration. Push fluids. Recheck UA with PCP in 1 week. Urine culture pending. -     Urine Culture  . Reviewed expectations re: course of current medical issues. . Discussed self-management of symptoms. . Outlined signs and symptoms indicating need for more acute intervention. . Patient verbalized understanding and all questions were answered. . See orders for this visit as documented in the electronic medical record. . Patient received an After-Visit Summary.  CMA or LPN served as scribe during this visit. History, Physical, and Plan performed by medical provider. The above documentation has been reviewed and is accurate and complete.  Inda Coke, PA-C

## 2018-12-04 ENCOUNTER — Encounter: Payer: Self-pay | Admitting: Physician Assistant

## 2018-12-04 LAB — URINE CULTURE
MICRO NUMBER:: 91477669
Result:: NO GROWTH
SPECIMEN QUALITY:: ADEQUATE

## 2018-12-09 ENCOUNTER — Ambulatory Visit (INDEPENDENT_AMBULATORY_CARE_PROVIDER_SITE_OTHER): Payer: Medicare Other | Admitting: Family Medicine

## 2018-12-09 ENCOUNTER — Encounter: Payer: Self-pay | Admitting: Family Medicine

## 2018-12-09 ENCOUNTER — Other Ambulatory Visit: Payer: Self-pay

## 2018-12-09 ENCOUNTER — Telehealth: Payer: Self-pay

## 2018-12-09 VITALS — BP 122/66 | HR 83 | Temp 97.6°F | Ht 64.0 in | Wt 140.0 lb

## 2018-12-09 DIAGNOSIS — E7800 Pure hypercholesterolemia, unspecified: Secondary | ICD-10-CM | POA: Diagnosis not present

## 2018-12-09 DIAGNOSIS — M545 Low back pain, unspecified: Secondary | ICD-10-CM

## 2018-12-09 DIAGNOSIS — I480 Paroxysmal atrial fibrillation: Secondary | ICD-10-CM

## 2018-12-09 DIAGNOSIS — I10 Essential (primary) hypertension: Secondary | ICD-10-CM

## 2018-12-09 LAB — CBC WITH DIFFERENTIAL/PLATELET
Basophils Absolute: 0 10*3/uL (ref 0.0–0.1)
Basophils Relative: 0.7 % (ref 0.0–3.0)
Eosinophils Absolute: 0 10*3/uL (ref 0.0–0.7)
Eosinophils Relative: 0.4 % (ref 0.0–5.0)
HCT: 31.5 % — ABNORMAL LOW (ref 39.0–52.0)
Hemoglobin: 10.6 g/dL — ABNORMAL LOW (ref 13.0–17.0)
LYMPHS ABS: 1.1 10*3/uL (ref 0.7–4.0)
Lymphocytes Relative: 25.6 % (ref 12.0–46.0)
MCHC: 33.7 g/dL (ref 30.0–36.0)
MCV: 87 fl (ref 78.0–100.0)
Monocytes Absolute: 0.5 10*3/uL (ref 0.1–1.0)
Monocytes Relative: 11.5 % (ref 3.0–12.0)
NEUTROS PCT: 61.8 % (ref 43.0–77.0)
Neutro Abs: 2.7 10*3/uL (ref 1.4–7.7)
Platelets: 64 10*3/uL — ABNORMAL LOW (ref 150.0–400.0)
RBC: 3.62 Mil/uL — ABNORMAL LOW (ref 4.22–5.81)
RDW: 16.2 % — ABNORMAL HIGH (ref 11.5–15.5)
WBC: 4.3 10*3/uL (ref 4.0–10.5)

## 2018-12-09 LAB — COMPREHENSIVE METABOLIC PANEL
ALT: 7 U/L (ref 0–53)
AST: 12 U/L (ref 0–37)
Albumin: 4.7 g/dL (ref 3.5–5.2)
Alkaline Phosphatase: 58 U/L (ref 39–117)
BUN: 15 mg/dL (ref 6–23)
CHLORIDE: 103 meq/L (ref 96–112)
CO2: 30 mEq/L (ref 19–32)
Calcium: 9.7 mg/dL (ref 8.4–10.5)
Creatinine, Ser: 1.29 mg/dL (ref 0.40–1.50)
GFR: 67.02 mL/min (ref >60.00)
Glucose, Bld: 105 mg/dL — ABNORMAL HIGH (ref 70–99)
Potassium: 4.2 mEq/L (ref 3.5–5.1)
Sodium: 141 mEq/L (ref 135–145)
Total Bilirubin: 0.6 mg/dL (ref 0.2–1.2)
Total Protein: 7.4 g/dL (ref 6.0–8.3)

## 2018-12-09 LAB — LIPID PANEL
Cholesterol: 117 mg/dL (ref 0–200)
HDL: 36.3 mg/dL — ABNORMAL LOW (ref >39.00)
LDL Cholesterol: 64 mg/dL (ref 0–99)
NONHDL: 81.14
Total CHOL/HDL Ratio: 3
Triglycerides: 84 mg/dL (ref 0.0–149.0)
VLDL: 16.8 mg/dL (ref 0.0–40.0)

## 2018-12-09 MED ORDER — APIXABAN 2.5 MG PO TABS: 2.5000 mg | ORAL_TABLET | Freq: Two times a day (BID) | ORAL | 3 refills | Status: DC

## 2018-12-09 MED ORDER — METOPROLOL TARTRATE 25 MG PO TABS: 25.0000 mg | ORAL_TABLET | Freq: Two times a day (BID) | ORAL | 3 refills | Status: DC

## 2018-12-09 NOTE — Progress Notes (Signed)
Subjective:     Patient ID: Joshua Burns, male   DOB: Dec 23, 1925, 83 y.o.   MRN: [id number]  HPI Patient is seen with some progressive left lumbar back pain.  He was seen in another clinic recently and had x-rays which showed some fairly diffuse degenerative changes otherwise no acute findings.  His urinalysis showed some ketones but no signs of infection.  He denies any recent falls.  He has had previous back problems and apparently had some type of injection in his back previously from orthopedic specialist which helped.  He would like to see orthopedic specialist again.  Patient relates pain mostly left lower lumbar area which radiates down toward the knee at times.  Occasional radiation to the right.  He is tried Tylenol and lidocaine patch with minimal relief.  Denies any fever.  No appetite or weight changes.  No loss of urine or stool continence.  Currently lives with his son but he has difficulty sometimes with things like transportation.  Joshua Burns does not any longer drive.  Is here today with a grandchild who states that he has become more immobile recently because of his back pain.  Patient's other medical problems include history of hyperlipidemia, hypertension, A. fib.  He is overdue for lab work.  Medications reviewed with patient and his grandson.  He has not been taking his Cardura and has not had any slow urinary stream  Past Medical History:  Diagnosis Date  . Alzheimer disease ([place])   . Arthritis    "knees" (11/28/2017)  . CAD (coronary artery disease)   . Glaucoma, both eyes   . HYPERLIPIDEMIA 01/28/2010  . HYPERTENSION 01/28/2010  . HYPERTROPHY PROSTATE W/UR OBST & OTH LUTS 01/28/2010  . Presence of permanent cardiac pacemaker 11/28/2017   Past Surgical History:  Procedure Laterality Date  . ANTERIOR CERVICAL DECOMP/DISCECTOMY FUSION  02/2003   Joshua Burns 05/08/2011  . BRAIN SURGERY    . CARDIOVASCULAR STRESS TEST  08-04-2008   EF 0%  . CATARACT EXTRACTION W/ INTRAOCULAR  LENS  IMPLANT, BILATERAL Bilateral   . CORONARY ANGIOPLASTY  01/2003   Joshua Burns 05/08/2011  . INGUINAL HERNIA REPAIR Right 03/2004   Joshua Burns 05/08/2011  . INSERT / REPLACE / REMOVE PACEMAKER  11/28/2017  . KNEE ARTHROSCOPY Left 09/2011   Joshua Burns 09/26/2011  . LOOP RECORDER INSERTION N/A 08/20/2017   Procedure: LOOP RECORDER INSERTION;  Surgeon: Joshua Klein, MD;  Location: [place] [facility];  Service: Cardiovascular;  Laterality: N/A;  . LOOP RECORDER REMOVAL  11/28/2017  . LOOP RECORDER REMOVAL N/A 11/28/2017   Procedure: LOOP RECORDER REMOVAL;  Surgeon: Joshua Klein, MD;  Location: [place] [facility];  Service: Cardiovascular;  Laterality: N/A;  . PACEMAKER IMPLANT N/A 11/28/2017   Procedure: PACEMAKER IMPLANT - DUAL CHAMBER;  Surgeon: Joshua Klein, MD;  Location: [place] [facility];  Service: Cardiovascular;  Laterality: N/A;  . PROSTATE BIOPSY  03/2008  . US ECHOCARDIOGRAPHY  08-04-2008   Est EF 55-60%    reports that he quit smoking about 39 years ago. His smoking use included cigarettes. He has a 38.00 pack-year smoking history. He has never used smokeless tobacco. He reports that he does not drink alcohol or use drugs. family history includes Aneurysm in his brother; Heart disease in his father and mother. Allergies  Allergen Reactions  . Ace Inhibitors Swelling  . Diltiazem Swelling and Palpitations     Review of Systems  Constitutional: Negative for activity change, appetite change and fever.  Respiratory: Negative for  cough and shortness of breath.   Cardiovascular: Negative for chest pain and leg swelling.  Gastrointestinal: Negative for abdominal pain and vomiting.  Genitourinary: Negative for dysuria, flank pain and hematuria.  Musculoskeletal: Positive for back pain. Negative for joint swelling.  Neurological: Negative for weakness and numbness.       Objective:   Physical Exam Constitutional:      Appearance: Normal appearance.  Cardiovascular:     Rate and  Rhythm: Normal rate and regular rhythm.  Pulmonary:     Effort: Pulmonary effort is normal.     Breath sounds: Normal breath sounds.  Musculoskeletal:     Comments: Right leg raises are negative bilaterally  Neurological:     Mental Status: He is alert.     Comments: He has full strength with plantarflexion, dorsiflexion, and knee extension bilaterally.  He is able to get from sitting to table and back down without assistance. Symmetric reflexes ankle and knee.  Normal sensory function to touch        Assessment:     #1 patient is seen with several month history of progressive lumbar back pain.  No focal neuro deficits.  #2 hypertension stable and at goal  #3 dyslipidemia  #4 history of atrial fibrillation on chronic Eliquis    Plan:     -Reiterated importance of avoiding nonsteroidals at his age -Continue Tylenol as needed for symptom relief -Discussed setting up some home physical therapy and they agree -We will go ahead and set up referral back to Joshua Burns -whom he has seen in the past.  This is at their request. -Check labs with lipid panel, hepatic panel, basic metabolic panel, CBC  Joshua Post MD [facility]

## 2018-12-09 NOTE — Telephone Encounter (Signed)
Called Joshua Burns and went over medications. Joshua Burns verbalized an understanding.  Copied from [address] [phone]. Topic: Quick Communication - See Telephone Encounter >> Dec 04, 2018 12:34 PM Sheran Luz wrote: CRM for notification. See Telephone encounter for: 12/04/18.  Patients son, Joshua Burns, is requesting a call back to go over current medication list. [name] he could find list on patients mychart but Joshua Burns feels more comfortable receiving list from clinical staff member. Please advise.

## 2018-12-09 NOTE — Patient Instructions (Signed)
We will set up home physical therapy and referral back to Dr Nelva Bush.

## 2018-12-10 ENCOUNTER — Other Ambulatory Visit: Payer: Self-pay | Admitting: Family Medicine

## 2018-12-11 ENCOUNTER — Ambulatory Visit (INDEPENDENT_AMBULATORY_CARE_PROVIDER_SITE_OTHER): Payer: Medicare Other

## 2018-12-11 ENCOUNTER — Telehealth: Payer: Self-pay | Admitting: Cardiovascular Disease

## 2018-12-11 DIAGNOSIS — Z87891 Personal history of nicotine dependence: Secondary | ICD-10-CM | POA: Diagnosis not present

## 2018-12-11 DIAGNOSIS — Z7901 Long term (current) use of anticoagulants: Secondary | ICD-10-CM | POA: Diagnosis not present

## 2018-12-11 DIAGNOSIS — I10 Essential (primary) hypertension: Secondary | ICD-10-CM | POA: Diagnosis not present

## 2018-12-11 DIAGNOSIS — I495 Sick sinus syndrome: Secondary | ICD-10-CM

## 2018-12-11 DIAGNOSIS — I2510 Atherosclerotic heart disease of native coronary artery without angina pectoris: Secondary | ICD-10-CM | POA: Diagnosis not present

## 2018-12-11 DIAGNOSIS — M170 Bilateral primary osteoarthritis of knee: Secondary | ICD-10-CM | POA: Diagnosis not present

## 2018-12-11 DIAGNOSIS — M4726 Other spondylosis with radiculopathy, lumbar region: Secondary | ICD-10-CM | POA: Diagnosis not present

## 2018-12-11 DIAGNOSIS — G309 Alzheimer's disease, unspecified: Secondary | ICD-10-CM | POA: Diagnosis not present

## 2018-12-11 DIAGNOSIS — F0280 Dementia in other diseases classified elsewhere without behavioral disturbance: Secondary | ICD-10-CM | POA: Diagnosis not present

## 2018-12-11 DIAGNOSIS — I4891 Unspecified atrial fibrillation: Secondary | ICD-10-CM | POA: Diagnosis not present

## 2018-12-11 DIAGNOSIS — Z950 Presence of cardiac pacemaker: Secondary | ICD-10-CM | POA: Diagnosis not present

## 2018-12-11 NOTE — Progress Notes (Signed)
Remote pacemaker transmission.   

## 2018-12-11 NOTE — Telephone Encounter (Signed)
Spoke w/ pt son and informed him that the transmission has been received. Pt son verbalized understanding.

## 2018-12-11 NOTE — Telephone Encounter (Signed)
New Message ° ° ° °1. Has your device fired? no ° °2. Is you device beeping? no ° °3. Are you experiencing draining or swelling at device site? no ° °4. Are you calling to see if we received your device transmission? yes ° °5. Have you passed out? no ° ° ° °Please route to Device Clinic Pool ° °

## 2018-12-12 ENCOUNTER — Other Ambulatory Visit: Payer: Self-pay | Admitting: Family Medicine

## 2018-12-12 ENCOUNTER — Encounter: Payer: Self-pay | Admitting: Cardiology

## 2018-12-12 NOTE — Telephone Encounter (Signed)
Requested medication (s) are due for refill today -historical  Requested medication (s) are on the active medication list -yes  Future visit scheduled -no  Last refill: historical Rx  Notes to clinic: Rx meets protocol criteria- medication is listed as historical will need # and RF information  Requested Prescriptions  Pending Prescriptions Disp Refills   amLODipine-benazepril (LOTREL) 5-20 MG capsule      Sig: Take 1 capsule by mouth daily.     Cardiovascular: CCB + ACEI Combos Passed - 12/12/2018  1:19 PM      Passed - Cr in normal range and within 180 days    Creatinine, Ser  Date Value Ref Range Status  12/09/2018 1.29 0.40 - 1.50 mg/dL Final         Passed - K in normal range and within 180 days    Potassium  Date Value Ref Range Status  12/09/2018 4.2 3.5 - 5.1 mEq/L Final         Passed - Patient is not pregnant      Passed - Last BP in normal range    BP Readings from Last 1 Encounters:  12/09/18 122/66         Passed - Valid encounter within last 6 months    Recent Outpatient Visits          3 days ago Essential hypertension   Therapist, music at [facility], Alinda Sierras, MD   1 week ago Acute bilateral low back pain with left-sided sciatica   [facility], [place], [place]   11 months ago [employer] annual wellness visit, subsequent   Therapist, music at [facility], Alinda Sierras, [place]   1 year ago Spondylosis of cervical region without myelopathy or radiculopathy   Therapist, music at [facility], Alinda Sierras, MD   2 years ago Cognitive impairment   Therapist, music at [place], [place]              Requested Prescriptions  Pending Prescriptions Disp Refills   amLODipine-benazepril (LOTREL) 5-20 MG capsule      Sig: Take 1 capsule by mouth daily.     Cardiovascular: CCB + ACEI Combos Passed - 12/12/2018  1:19 PM      Passed - Cr in normal range and within 180 days    Creatinine, Ser   Date Value Ref Range Status  12/09/2018 1.29 0.40 - 1.50 mg/dL Final         Passed - K in normal range and within 180 days    Potassium  Date Value Ref Range Status  12/09/2018 4.2 3.5 - 5.1 mEq/L Final         Passed - Patient is not pregnant      Passed - Last BP in normal range    BP Readings from Last 1 Encounters:  12/09/18 122/66         Passed - Valid encounter within last 6 months    Recent Outpatient Visits          3 days ago Essential hypertension   Therapist, music at [facility], Alinda Sierras, MD   1 week ago Acute bilateral low back pain with left-sided sciatica   [facility] Worley, [place], [place]   11 months ago [employer] annual wellness visit, subsequent   Therapist, music at [facility], Alinda Sierras, MD   1 year ago Spondylosis of cervical region without myelopathy or radiculopathy   Therapist, music at [facility], [place],  MD   2 years ago Cognitive impairment   Therapist, music at [facility], Alinda Sierras, MD

## 2018-12-12 NOTE — Telephone Encounter (Signed)
Copied from [place] [id number]. Topic: Quick Communication - Rx Refill/Question >> Dec 12, 2018  1:10 PM Bea Graff, NT wrote: Medication: amLODipine-benazepril (LOTREL) 5-20 MG capsule  Has the patient contacted their pharmacy? Yes.   (Agent: If no, request that the patient contact the pharmacy for the refill.) (Agent: If yes, when and what did the pharmacy advise?)  Preferred Pharmacy (with phone number or street name): [id number] [place], [place] AT [address] [phone] (Phone) [phone] (Fax)    Agent: Please be advised that RX refills may take up to 3 business days. We ask that you follow-up with your pharmacy.

## 2018-12-13 ENCOUNTER — Telehealth: Payer: Self-pay | Admitting: Family Medicine

## 2018-12-13 MED ORDER — AMLODIPINE BESY-BENAZEPRIL HCL 5-20 MG PO CAPS: 1.0000 | ORAL_CAPSULE | Freq: Every day | ORAL | 3 refills | Status: DC

## 2018-12-13 NOTE — Telephone Encounter (Signed)
Joshua Burns and gave her the verbal OK per Dr. Elease Hashimoto. [name] verbalized an understanding.

## 2018-12-13 NOTE — Telephone Encounter (Signed)
Copied from [place] [phone]. Topic: General - Other >> Dec 13, 2018  4:04 PM Keene Breath wrote: Reason for CRM: Leda Gauze, PT, called to get verbal orders for Speech therapy evaluation to access the cognition of patient.  Also PT for back pain, 2x wk for 1 wk, 1x wk for 1 wk, starting next week.  Please advise and call back if you have any questions.  CB# [phone]

## 2018-12-13 NOTE — Telephone Encounter (Signed)
He is taking this (per grandson).  Refill for one year.

## 2018-12-13 NOTE — Telephone Encounter (Signed)
Please advise 

## 2018-12-13 NOTE — Telephone Encounter (Signed)
On current med list but looks like it has not been filled in years?  Please advise if okay to fill or decline.

## 2018-12-13 NOTE — Telephone Encounter (Signed)
ok 

## 2018-12-16 DIAGNOSIS — M170 Bilateral primary osteoarthritis of knee: Secondary | ICD-10-CM | POA: Diagnosis not present

## 2018-12-16 DIAGNOSIS — I2510 Atherosclerotic heart disease of native coronary artery without angina pectoris: Secondary | ICD-10-CM | POA: Diagnosis not present

## 2018-12-16 DIAGNOSIS — I10 Essential (primary) hypertension: Secondary | ICD-10-CM | POA: Diagnosis not present

## 2018-12-16 DIAGNOSIS — G309 Alzheimer's disease, unspecified: Secondary | ICD-10-CM | POA: Diagnosis not present

## 2018-12-16 DIAGNOSIS — M4726 Other spondylosis with radiculopathy, lumbar region: Secondary | ICD-10-CM | POA: Diagnosis not present

## 2018-12-16 DIAGNOSIS — F0280 Dementia in other diseases classified elsewhere without behavioral disturbance: Secondary | ICD-10-CM | POA: Diagnosis not present

## 2018-12-19 DIAGNOSIS — M170 Bilateral primary osteoarthritis of knee: Secondary | ICD-10-CM | POA: Diagnosis not present

## 2018-12-19 DIAGNOSIS — I10 Essential (primary) hypertension: Secondary | ICD-10-CM | POA: Diagnosis not present

## 2018-12-19 DIAGNOSIS — M4726 Other spondylosis with radiculopathy, lumbar region: Secondary | ICD-10-CM | POA: Diagnosis not present

## 2018-12-19 DIAGNOSIS — F0280 Dementia in other diseases classified elsewhere without behavioral disturbance: Secondary | ICD-10-CM | POA: Diagnosis not present

## 2018-12-19 DIAGNOSIS — I2510 Atherosclerotic heart disease of native coronary artery without angina pectoris: Secondary | ICD-10-CM | POA: Diagnosis not present

## 2018-12-19 DIAGNOSIS — G309 Alzheimer's disease, unspecified: Secondary | ICD-10-CM | POA: Diagnosis not present

## 2018-12-23 DIAGNOSIS — F0280 Dementia in other diseases classified elsewhere without behavioral disturbance: Secondary | ICD-10-CM | POA: Diagnosis not present

## 2018-12-23 DIAGNOSIS — I10 Essential (primary) hypertension: Secondary | ICD-10-CM | POA: Diagnosis not present

## 2018-12-23 DIAGNOSIS — G309 Alzheimer's disease, unspecified: Secondary | ICD-10-CM | POA: Diagnosis not present

## 2018-12-23 DIAGNOSIS — M170 Bilateral primary osteoarthritis of knee: Secondary | ICD-10-CM | POA: Diagnosis not present

## 2018-12-23 DIAGNOSIS — I2510 Atherosclerotic heart disease of native coronary artery without angina pectoris: Secondary | ICD-10-CM | POA: Diagnosis not present

## 2018-12-23 DIAGNOSIS — M4726 Other spondylosis with radiculopathy, lumbar region: Secondary | ICD-10-CM | POA: Diagnosis not present

## 2018-12-26 DIAGNOSIS — I10 Essential (primary) hypertension: Secondary | ICD-10-CM | POA: Diagnosis not present

## 2018-12-26 DIAGNOSIS — M4726 Other spondylosis with radiculopathy, lumbar region: Secondary | ICD-10-CM | POA: Diagnosis not present

## 2018-12-26 DIAGNOSIS — I2510 Atherosclerotic heart disease of native coronary artery without angina pectoris: Secondary | ICD-10-CM | POA: Diagnosis not present

## 2018-12-26 DIAGNOSIS — M170 Bilateral primary osteoarthritis of knee: Secondary | ICD-10-CM | POA: Diagnosis not present

## 2018-12-26 DIAGNOSIS — G309 Alzheimer's disease, unspecified: Secondary | ICD-10-CM | POA: Diagnosis not present

## 2018-12-26 DIAGNOSIS — F0280 Dementia in other diseases classified elsewhere without behavioral disturbance: Secondary | ICD-10-CM | POA: Diagnosis not present

## 2018-12-30 DIAGNOSIS — G309 Alzheimer's disease, unspecified: Secondary | ICD-10-CM | POA: Diagnosis not present

## 2018-12-30 DIAGNOSIS — I10 Essential (primary) hypertension: Secondary | ICD-10-CM | POA: Diagnosis not present

## 2018-12-30 DIAGNOSIS — F0280 Dementia in other diseases classified elsewhere without behavioral disturbance: Secondary | ICD-10-CM | POA: Diagnosis not present

## 2018-12-30 DIAGNOSIS — M4726 Other spondylosis with radiculopathy, lumbar region: Secondary | ICD-10-CM | POA: Diagnosis not present

## 2018-12-30 DIAGNOSIS — I2510 Atherosclerotic heart disease of native coronary artery without angina pectoris: Secondary | ICD-10-CM | POA: Diagnosis not present

## 2018-12-30 DIAGNOSIS — M170 Bilateral primary osteoarthritis of knee: Secondary | ICD-10-CM | POA: Diagnosis not present

## 2019-01-03 DIAGNOSIS — I2510 Atherosclerotic heart disease of native coronary artery without angina pectoris: Secondary | ICD-10-CM | POA: Diagnosis not present

## 2019-01-03 DIAGNOSIS — M170 Bilateral primary osteoarthritis of knee: Secondary | ICD-10-CM | POA: Diagnosis not present

## 2019-01-03 DIAGNOSIS — I10 Essential (primary) hypertension: Secondary | ICD-10-CM | POA: Diagnosis not present

## 2019-01-03 DIAGNOSIS — G309 Alzheimer's disease, unspecified: Secondary | ICD-10-CM | POA: Diagnosis not present

## 2019-01-03 DIAGNOSIS — M4726 Other spondylosis with radiculopathy, lumbar region: Secondary | ICD-10-CM | POA: Diagnosis not present

## 2019-01-03 DIAGNOSIS — F0280 Dementia in other diseases classified elsewhere without behavioral disturbance: Secondary | ICD-10-CM | POA: Diagnosis not present

## 2019-01-07 DIAGNOSIS — G309 Alzheimer's disease, unspecified: Secondary | ICD-10-CM | POA: Diagnosis not present

## 2019-01-07 DIAGNOSIS — M170 Bilateral primary osteoarthritis of knee: Secondary | ICD-10-CM | POA: Diagnosis not present

## 2019-01-07 DIAGNOSIS — I10 Essential (primary) hypertension: Secondary | ICD-10-CM | POA: Diagnosis not present

## 2019-01-07 DIAGNOSIS — M4726 Other spondylosis with radiculopathy, lumbar region: Secondary | ICD-10-CM | POA: Diagnosis not present

## 2019-01-07 DIAGNOSIS — I2510 Atherosclerotic heart disease of native coronary artery without angina pectoris: Secondary | ICD-10-CM | POA: Diagnosis not present

## 2019-01-07 DIAGNOSIS — F0280 Dementia in other diseases classified elsewhere without behavioral disturbance: Secondary | ICD-10-CM | POA: Diagnosis not present

## 2019-01-12 LAB — CUP PACEART REMOTE DEVICE CHECK
Battery Remaining Longevity: 130 mo
Battery Voltage: 3.02 V
Brady Statistic AP VP Percent: 49.9 %
Brady Statistic AS VP Percent: 1.58 %
Brady Statistic AS VS Percent: 23.34 %
Brady Statistic RA Percent Paced: 75.86 %
Brady Statistic RV Percent Paced: 51.48 %
Date Time Interrogation Session: 20191218051910
Implantable Lead Implant Date: 20181205
Implantable Lead Location: 753859
Implantable Lead Location: 753860
Implantable Lead Model: 5076
Implantable Lead Model: 5076
Implantable Pulse Generator Implant Date: 20181205
Lead Channel Impedance Value: 266 Ohm
Lead Channel Impedance Value: 285 Ohm
Lead Channel Impedance Value: 399 Ohm
Lead Channel Impedance Value: 399 Ohm
Lead Channel Pacing Threshold Amplitude: 0.5 V
Lead Channel Pacing Threshold Amplitude: 1 V
Lead Channel Pacing Threshold Pulse Width: 0.4 ms
Lead Channel Pacing Threshold Pulse Width: 0.4 ms
Lead Channel Sensing Intrinsic Amplitude: 2.125 mV
Lead Channel Sensing Intrinsic Amplitude: 5.875 mV
Lead Channel Setting Pacing Amplitude: 1.5 V
Lead Channel Setting Pacing Amplitude: 2.5 V
Lead Channel Setting Pacing Pulse Width: 0.4 ms
Lead Channel Setting Sensing Sensitivity: 0.9 mV
MDC IDC LEAD IMPLANT DT: 20181205
MDC IDC MSMT LEADCHNL RA SENSING INTR AMPL: 2.125 mV
MDC IDC MSMT LEADCHNL RV SENSING INTR AMPL: 5.875 mV
MDC IDC STAT BRADY AP VS PERCENT: 25.17 %

## 2019-01-23 ENCOUNTER — Other Ambulatory Visit: Payer: Self-pay | Admitting: Family Medicine

## 2019-01-24 ENCOUNTER — Telehealth: Payer: Self-pay | Admitting: Family Medicine

## 2019-01-24 LAB — CUP PACEART INCLINIC DEVICE CHECK
Date Time Interrogation Session: 20200131152414
Implantable Lead Implant Date: 20181205
Implantable Lead Location: 753859
Implantable Lead Location: 753860
Implantable Lead Model: 5076
Implantable Lead Model: 5076
Implantable Pulse Generator Implant Date: 20181205
MDC IDC LEAD IMPLANT DT: 20181205

## 2019-01-24 NOTE — Telephone Encounter (Signed)
done

## 2019-01-24 NOTE — Telephone Encounter (Signed)
Form placed on your desk in red folder. Please return when complete. Thank you!

## 2019-01-24 NOTE — Telephone Encounter (Signed)
Pt dropped off [place] DMV Disability Parking Placard to be completed by the provider.  Upon completion pt would like to have it mailed to [place]. Motor Vehicle form pt is aware that normally the pt take it to the [facility].  Form placed in providers folder for completion

## 2019-01-28 NOTE — Telephone Encounter (Signed)
Called home number and daughter Joshua Burns answered, unable to speak with her as she is not on DPR, she is the daughter so advised for family to update DPR.  Called son Joshua Burns, left a detailed voice message to let him know that we are not able to mail the [place] DMV handicap placard form since a payment is due with this form and needs to be mailed with it. Form has been placed up front for pick up.

## 2019-01-29 NOTE — Telephone Encounter (Signed)
Joshua Burns came and picked up the form

## 2019-03-05 ENCOUNTER — Encounter: Payer: Self-pay | Admitting: Family Medicine

## 2019-03-05 ENCOUNTER — Other Ambulatory Visit: Payer: Self-pay

## 2019-03-05 ENCOUNTER — Ambulatory Visit (INDEPENDENT_AMBULATORY_CARE_PROVIDER_SITE_OTHER): Payer: Medicare Other | Admitting: Family Medicine

## 2019-03-05 VITALS — BP 130/82 | HR 79 | Temp 97.8°F | Ht 64.0 in | Wt 141.8 lb

## 2019-03-05 DIAGNOSIS — J209 Acute bronchitis, unspecified: Secondary | ICD-10-CM

## 2019-03-05 NOTE — Patient Instructions (Signed)

## 2019-03-05 NOTE — Progress Notes (Signed)
Subjective:     Patient ID: Joshua Burns, male   DOB: 06-12-1925, 85 y.o.   MRN: [id number]  HPI Patient is here with cough for about a week and a half.  Dry cough.  Relatively mild.  No fever.  No dyspnea.  Patient is still riding on his exercise bike about 30 minutes/day with no exercise intolerance.  No chest pain.  He has some nasal congestion.  Using saline nasal for that.  Appetite and weight are stable.  No chest pains.  No recent travels.  Past Medical History:  Diagnosis Date  . Alzheimer disease ([place])   . Arthritis    "knees" (11/28/2017)  . CAD (coronary artery disease)   . Glaucoma, both eyes   . HYPERLIPIDEMIA 01/28/2010  . HYPERTENSION 01/28/2010  . HYPERTROPHY PROSTATE W/UR OBST & OTH LUTS 01/28/2010  . Presence of permanent cardiac pacemaker 11/28/2017   Past Surgical History:  Procedure Laterality Date  . ANTERIOR CERVICAL DECOMP/DISCECTOMY FUSION  02/2003   Archie Endo 05/08/2011  . BRAIN SURGERY    . CARDIOVASCULAR STRESS TEST  08-04-2008   EF 0%  . CATARACT EXTRACTION W/ INTRAOCULAR LENS  IMPLANT, BILATERAL Bilateral   . CORONARY ANGIOPLASTY  01/2003   Archie Endo 05/08/2011  . INGUINAL HERNIA REPAIR Right 03/2004   Archie Endo 05/08/2011  . INSERT / REPLACE / REMOVE PACEMAKER  11/28/2017  . KNEE ARTHROSCOPY Left 09/2011   Archie Endo 09/26/2011  . LOOP RECORDER INSERTION N/A 08/20/2017   Procedure: LOOP RECORDER INSERTION;  Surgeon: Sanda Klein, MD;  Location: [facility];  Service: Cardiovascular;  Laterality: N/A;  . LOOP RECORDER REMOVAL  11/28/2017  . LOOP RECORDER REMOVAL N/A 11/28/2017   Procedure: LOOP RECORDER REMOVAL;  Surgeon: Sanda Klein, MD;  Location: [facility];  Service: Cardiovascular;  Laterality: N/A;  . PACEMAKER IMPLANT N/A 11/28/2017   Procedure: PACEMAKER IMPLANT - DUAL CHAMBER;  Surgeon: Sanda Klein, MD;  Location: [facility];  Service: Cardiovascular;  Laterality: N/A;  . PROSTATE BIOPSY  03/2008  . US ECHOCARDIOGRAPHY  08-04-2008    Est EF 55-60%    reports that he quit smoking about 40 years ago. His smoking use included cigarettes. He has a 38.00 pack-year smoking history. He has never used smokeless tobacco. He reports that he does not drink alcohol or use drugs. family history includes Aneurysm in his brother; Heart disease in his father and mother. Allergies  Allergen Reactions  . Ace Inhibitors Swelling  . Diltiazem Swelling and Palpitations     Review of Systems  Constitutional: Negative for chills and fever.  Respiratory: Positive for cough. Negative for shortness of breath and wheezing.   Cardiovascular: Negative for chest pain.       Objective:   Physical Exam Constitutional:      Appearance: Normal appearance.  Neck:     Musculoskeletal: Neck supple.  Cardiovascular:     Rate and Rhythm: Normal rate and regular rhythm.  Pulmonary:     Effort: Pulmonary effort is normal. No respiratory distress.     Breath sounds: Normal breath sounds. No wheezing or rales.  Musculoskeletal:     Right lower leg: No edema.     Left lower leg: No edema.  Neurological:     Mental Status: He is alert.        Assessment:     Cough.  Suspect acute viral bronchitis.  Nonfocal exam.  No respiratory distress.    Plan:     -Plenty of fluids and  rest.  Follow-up promptly for any dyspnea or fever.  They will continue nasal saline for his nasal congestion symptoms  Eulas Post MD [facility] at [facility]

## 2019-03-12 ENCOUNTER — Other Ambulatory Visit: Payer: Self-pay

## 2019-03-12 ENCOUNTER — Ambulatory Visit (INDEPENDENT_AMBULATORY_CARE_PROVIDER_SITE_OTHER): Payer: Medicare Other | Admitting: *Deleted

## 2019-03-12 DIAGNOSIS — I495 Sick sinus syndrome: Secondary | ICD-10-CM | POA: Diagnosis not present

## 2019-03-12 LAB — CUP PACEART REMOTE DEVICE CHECK
Battery Remaining Longevity: 126 mo
Battery Voltage: 3.01 V
Brady Statistic AP VP Percent: 62.93 %
Brady Statistic AS VP Percent: 1.37 %
Brady Statistic AS VS Percent: 9.1 %
Brady Statistic RA Percent Paced: 90.15 %
Brady Statistic RV Percent Paced: 64.29 %
Date Time Interrogation Session: 20200318110853
Implantable Lead Implant Date: 20181205
Implantable Lead Implant Date: 20181205
Implantable Lead Location: 753859
Implantable Lead Location: 753860
Implantable Lead Model: 5076
Implantable Lead Model: 5076
Implantable Pulse Generator Implant Date: 20181205
Lead Channel Impedance Value: 247 Ohm
Lead Channel Impedance Value: 285 Ohm
Lead Channel Impedance Value: 399 Ohm
Lead Channel Impedance Value: 399 Ohm
Lead Channel Pacing Threshold Amplitude: 0.625 V
Lead Channel Pacing Threshold Pulse Width: 0.4 ms
Lead Channel Pacing Threshold Pulse Width: 0.4 ms
Lead Channel Sensing Intrinsic Amplitude: 2.125 mV
Lead Channel Sensing Intrinsic Amplitude: 6.375 mV
Lead Channel Sensing Intrinsic Amplitude: 6.375 mV
Lead Channel Setting Pacing Amplitude: 1.5 V
Lead Channel Setting Pacing Amplitude: 2.5 V
Lead Channel Setting Pacing Pulse Width: 0.4 ms
Lead Channel Setting Sensing Sensitivity: 0.9 mV
MDC IDC MSMT LEADCHNL RA SENSING INTR AMPL: 2.125 mV
MDC IDC MSMT LEADCHNL RV PACING THRESHOLD AMPLITUDE: 0.875 V
MDC IDC STAT BRADY AP VS PERCENT: 26.61 %

## 2019-03-13 ENCOUNTER — Telehealth: Payer: Self-pay | Admitting: Cardiovascular Disease

## 2019-03-13 NOTE — Telephone Encounter (Signed)
New Message    *STAT* If patient is at the pharmacy, call can be transferred to refill team.   1. Which medications need to be refilled? (please list name of each medication and dose if known) simvastatin 20mg , Hydrochlorothaiazide 12.5mg , and metoprolol tartrate 25mg   2. Which pharmacy/location (including street and city if local pharmacy) [facility] [address]  3. Do they need a 30 day or 90 day supply? 90 Day supply

## 2019-03-14 ENCOUNTER — Other Ambulatory Visit: Payer: Self-pay

## 2019-03-14 MED ORDER — SIMVASTATIN 20 MG PO TABS: ORAL_TABLET | ORAL | 0 refills | Status: DC

## 2019-03-14 MED ORDER — METOPROLOL TARTRATE 25 MG PO TABS: 25.0000 mg | ORAL_TABLET | Freq: Two times a day (BID) | ORAL | 3 refills | Status: DC

## 2019-03-14 MED ORDER — HYDROCHLOROTHIAZIDE 12.5 MG PO TABS: 12.5000 mg | ORAL_TABLET | Freq: Every day | ORAL | 3 refills | Status: DC

## 2019-03-14 NOTE — Telephone Encounter (Signed)
OK to refill all.

## 2019-03-14 NOTE — Telephone Encounter (Signed)
Patient is requesting refills for  Simvastatin Hydrochlorothiazide Metoprolol Tartrate   Looks like PCP prescribed these.

## 2019-03-14 NOTE — Telephone Encounter (Signed)
Requested Prescriptions   Signed Prescriptions Disp Refills  . hydrochlorothiazide (HYDRODIURIL) 12.5 MG tablet 90 tablet 3    Sig: Take 1 tablet (12.5 mg total) by mouth daily.    Authorizing Provider: Sanda Klein    Ordering User: Janan Ridge metoprolol tartrate (LOPRESSOR) 25 MG tablet 180 tablet 3    Sig: Take 1 tablet (25 mg total) by mouth 2 (two) times daily.    Authorizing Provider: Sanda Klein    Ordering User: Janan Ridge simvastatin (ZOCOR) 20 MG tablet 90 tablet 0    Sig: TAKE 1 TABLET BY MOUTH EVERYDAY AT BEDTIME    Authorizing Provider: Sanda Klein    Ordering User: Janan Ridge

## 2019-03-20 ENCOUNTER — Encounter: Payer: Self-pay | Admitting: Cardiology

## 2019-03-20 NOTE — Progress Notes (Signed)
Remote pacemaker transmission.   

## 2019-05-22 ENCOUNTER — Ambulatory Visit: Payer: Self-pay | Admitting: *Deleted

## 2019-05-22 NOTE — Telephone Encounter (Signed)
  Pt's daughter Jeannene Patella calling ,on [place]. States pt "Broke a blood vessel in his left eye last night." States minimal redness last night, this am entire eye "Blood filled." States no swelling, drainage. Pt denies pain, itching. Daughter asking for virtual appt. Email and ph # verified. Call transferred to practice, [facility], for consideration of appt.  Reason for Disposition . [1] Red eye AND [2] no blurred vision AND [3] minimal or no pain  Answer Assessment - Initial Assessment Questions 1. LOCATION: Location: "What's red, the eyeball or the outer eyelids?" (Note: when callers say the eye is red, they usually mean the sclera is red)       Entire eye 2. REDNESS OF SCLERA: "Is the redness in one or both eyes?" "When did the redness start?"      Left eye 3. ONSET: "When did the eye become red?" (e.g., hours, days)      Last night 4. EYELIDS: "Are the eyelids red or swollen?" If so, ask: "How much?"      no 5. VISION: "Is there any difficulty seeing clearly?"      no 6. ITCHING: "Does it feel itchy?" If so ask: "How bad is it" (e.g., Scale 1-10; or mild, moderate, severe)     no 7. PAIN: "Is there any pain? If so, ask: "How bad is it?" (e.g., Scale 1-10; or mild, moderate, severe)     no 8. CONTACT LENS: "Do you wear contacts?"     no 9. CAUSE: "What do you think is causing the redness?"     Blood vessel 10. OTHER SYMPTOMS: "Do you have any other symptoms?" (e.g., fever, runny nose, cough, vomiting)       no  Protocols used: EYE - RED WITHOUT PUS-A-AH

## 2019-05-22 NOTE — Telephone Encounter (Signed)
Pt scheduled with PCP

## 2019-05-23 ENCOUNTER — Other Ambulatory Visit: Payer: Self-pay

## 2019-05-23 ENCOUNTER — Other Ambulatory Visit: Payer: Self-pay | Admitting: Cardiovascular Disease

## 2019-05-23 ENCOUNTER — Ambulatory Visit (INDEPENDENT_AMBULATORY_CARE_PROVIDER_SITE_OTHER): Payer: Medicare Other | Admitting: Family Medicine

## 2019-05-23 DIAGNOSIS — H1132 Conjunctival hemorrhage, left eye: Secondary | ICD-10-CM | POA: Diagnosis not present

## 2019-05-23 NOTE — Progress Notes (Signed)
Patient ID: Joshua Burns, male   DOB: 1925-09-07, 85 y.o.   MRN: [id number]  This visit type was conducted due to national recommendations for restrictions regarding the COVID-19 pandemic in an effort to limit this patient's exposure and mitigate transmission in our community.   Virtual Visit via Video Note  I connected with Joshua Burns on 05/23/19 at  8:30 AM EDT by a video enabled telemedicine application and verified that I am speaking with the correct person using two identifiers.  Location patient: home Location provider:work or home office Persons participating in the virtual visit: patient, provider  I discussed the limitations of evaluation and management by telemedicine and the availability of in person appointments. The patient expressed understanding and agreed to proceed.   HPI: Patient woke up Wednesday morning with subconjunctival hemorrhage left eye.  He does take Eliquis 2.5 mg twice daily.  No other bleeding issues.  No history of trauma.  He denies any eye pain or blurred vision.  Daughter thinks that his redness was slightly progressed yesterday compared to Wednesday.  Otherwise doing well with no complaints.   ROS: See pertinent positives and negatives per HPI.  Past Medical History:  Diagnosis Date  . Alzheimer disease ([place])   . Arthritis    "knees" (11/28/2017)  . CAD (coronary artery disease)   . Glaucoma, both eyes   . HYPERLIPIDEMIA 01/28/2010  . HYPERTENSION 01/28/2010  . HYPERTROPHY PROSTATE W/UR OBST & OTH LUTS 01/28/2010  . Presence of permanent cardiac pacemaker 11/28/2017    Past Surgical History:  Procedure Laterality Date  . ANTERIOR CERVICAL DECOMP/DISCECTOMY FUSION  02/2003   Archie Endo 05/08/2011  . BRAIN SURGERY    . CARDIOVASCULAR STRESS TEST  08-04-2008   EF 0%  . CATARACT EXTRACTION W/ INTRAOCULAR LENS  IMPLANT, BILATERAL Bilateral   . CORONARY ANGIOPLASTY  01/2003   Archie Endo 05/08/2011  . INGUINAL HERNIA REPAIR Right 03/2004   Archie Endo 05/08/2011  .  INSERT / REPLACE / REMOVE PACEMAKER  11/28/2017  . KNEE ARTHROSCOPY Left 09/2011   Archie Endo 09/26/2011  . LOOP RECORDER INSERTION N/A 08/20/2017   Procedure: LOOP RECORDER INSERTION;  Surgeon: Sanda Klein, MD;  Location: [facility];  Service: Cardiovascular;  Laterality: N/A;  . LOOP RECORDER REMOVAL  11/28/2017  . LOOP RECORDER REMOVAL N/A 11/28/2017   Procedure: LOOP RECORDER REMOVAL;  Surgeon: Sanda Klein, MD;  Location: [facility];  Service: Cardiovascular;  Laterality: N/A;  . PACEMAKER IMPLANT N/A 11/28/2017   Procedure: PACEMAKER IMPLANT - DUAL CHAMBER;  Surgeon: Sanda Klein, MD;  Location: [facility];  Service: Cardiovascular;  Laterality: N/A;  . PROSTATE BIOPSY  03/2008  . US ECHOCARDIOGRAPHY  08-04-2008   Est EF 55-60%    Family History  Problem Relation Age of Onset  . Heart disease Mother   . Heart disease Father   . Aneurysm Brother     SOCIAL HX: Non-smoker.  He is widowed   Current Outpatient Medications:  .  amLODipine-benazepril (LOTREL) 5-20 MG capsule, Take 1 capsule by mouth daily., Disp: 90 capsule, Rfl: 3 .  apixaban (ELIQUIS) 2.5 MG TABS tablet, Take 1 tablet (2.5 mg total) by mouth 2 (two) times daily., Disp: 180 tablet, Rfl: 3 .  donepezil (ARICEPT) 10 MG tablet, TAKE 1 TABLET BY MOUTH EVERYDAY AT BEDTIME, Disp: 90 tablet, Rfl: 1 .  hydrochlorothiazide (HYDRODIURIL) 12.5 MG tablet, Take 1 tablet (12.5 mg total) by mouth daily., Disp: 90 tablet, Rfl: 3 .  metoprolol tartrate (LOPRESSOR) 25  MG tablet, Take 1 tablet (25 mg total) by mouth 2 (two) times daily., Disp: 180 tablet, Rfl: 3 .  potassium chloride SA (K-DUR,KLOR-CON) 20 MEQ tablet, Take 1 tablet (20 mEq total) by mouth daily., Disp: 90 tablet, Rfl: 2 .  simvastatin (ZOCOR) 20 MG tablet, TAKE 1 TABLET BY MOUTH EVERYDAY AT BEDTIME, Disp: 90 tablet, Rfl: 0  EXAM:  VITALS per patient if applicable:  GENERAL: alert, oriented, appears well and in no acute distress  HEENT:  He has obvious redness left eye in the subconjunctival region involving much of the sclera of the eye medially and laterally.  Otherwise eye appears normal.  Extraocular movements are normal.  No evidence for hyphema  NECK: normal movements of the head and neck  LUNGS: on inspection no signs of respiratory distress, breathing rate appears normal, no obvious gross SOB, gasping or wheezing  CV: no obvious cyanosis  MS: moves all visible extremities without noticeable abnormality  PSYCH/NEURO: pleasant and cooperative, no obvious depression or anxiety, speech and thought processing grossly intact  ASSESSMENT AND PLAN:  Discussed the following assessment and plan:  Non-traumatic subconjunctival hemorrhage of left eye   -Reassurance given.  We explained that subconjunctival hemorrhages are very self-limited and usually resolve within a few weeks.  -Follow-up immediately for any blurred vision, eye pain, or other new symptoms  -Continue Eliquis since this is a self-limited problem    I discussed the assessment and treatment plan with the patient. The patient was provided an opportunity to ask questions and all were answered. The patient agreed with the plan and demonstrated an understanding of the instructions.   The patient was advised to call back or seek an in-person evaluation if the symptoms worsen or if the condition fails to improve as anticipated.   Carolann Littler, MD

## 2019-06-01 ENCOUNTER — Other Ambulatory Visit: Payer: Self-pay | Admitting: Cardiovascular Disease

## 2019-06-05 ENCOUNTER — Other Ambulatory Visit: Payer: Self-pay | Admitting: Family Medicine

## 2019-06-11 ENCOUNTER — Ambulatory Visit (INDEPENDENT_AMBULATORY_CARE_PROVIDER_SITE_OTHER): Payer: Medicare Other | Admitting: *Deleted

## 2019-06-11 DIAGNOSIS — I495 Sick sinus syndrome: Secondary | ICD-10-CM

## 2019-06-11 LAB — CUP PACEART REMOTE DEVICE CHECK
Battery Remaining Longevity: 125 mo
Battery Voltage: 3.01 V
Brady Statistic AP VP Percent: 83.12 %
Brady Statistic AP VS Percent: 12.29 %
Brady Statistic AS VP Percent: 0.82 %
Brady Statistic AS VS Percent: 3.76 %
Brady Statistic RA Percent Paced: 96.2 %
Brady Statistic RV Percent Paced: 83.94 %
Date Time Interrogation Session: 20200617045609
Implantable Lead Implant Date: 20181205
Implantable Lead Implant Date: 20181205
Implantable Lead Location: 753859
Implantable Lead Location: 753860
Implantable Lead Model: 5076
Implantable Lead Model: 5076
Implantable Pulse Generator Implant Date: 20181205
Lead Channel Impedance Value: 247 Ohm
Lead Channel Impedance Value: 304 Ohm
Lead Channel Impedance Value: 399 Ohm
Lead Channel Impedance Value: 494 Ohm
Lead Channel Pacing Threshold Amplitude: 0.625 V
Lead Channel Pacing Threshold Amplitude: 1.125 V
Lead Channel Pacing Threshold Pulse Width: 0.4 ms
Lead Channel Pacing Threshold Pulse Width: 0.4 ms
Lead Channel Sensing Intrinsic Amplitude: 1.875 mV
Lead Channel Sensing Intrinsic Amplitude: 1.875 mV
Lead Channel Sensing Intrinsic Amplitude: 6 mV
Lead Channel Sensing Intrinsic Amplitude: 6 mV
Lead Channel Setting Pacing Amplitude: 1.5 V
Lead Channel Setting Pacing Amplitude: 2.5 V
Lead Channel Setting Pacing Pulse Width: 0.4 ms
Lead Channel Setting Sensing Sensitivity: 0.9 mV

## 2019-06-22 ENCOUNTER — Encounter: Payer: Self-pay | Admitting: Cardiology

## 2019-06-22 NOTE — Progress Notes (Signed)
Remote pacemaker transmission.   

## 2019-08-22 ENCOUNTER — Telehealth: Payer: Self-pay

## 2019-08-22 NOTE — Telephone Encounter (Signed)
Please advise 

## 2019-08-22 NOTE — Telephone Encounter (Signed)
Copied from [place] [phone]. Topic: Referral - Request for Referral >> Aug 22, 2019  1:58 PM Nils Flack wrote: Has patient seen PCP for this complaint? Yes.   *If NO, is insurance requiring patient see PCP for this issue before PCP can refer them? Referral for which specialty: physical therapy Preferred provider/office: home health physical therapy  Reason for referral: body stiffness Cb daughter [phone]

## 2019-08-24 NOTE — Telephone Encounter (Signed)
Set up Doxy to discuss. 

## 2019-08-25 NOTE — Telephone Encounter (Signed)
Called daughter [name] and she has set up a Doxy for tomorrow, 08/26/19 to discuss the PT order with her dad.

## 2019-08-26 ENCOUNTER — Other Ambulatory Visit: Payer: Self-pay | Admitting: Cardiology

## 2019-08-26 ENCOUNTER — Other Ambulatory Visit: Payer: Self-pay

## 2019-08-26 ENCOUNTER — Telehealth (INDEPENDENT_AMBULATORY_CARE_PROVIDER_SITE_OTHER): Payer: Medicare Other | Admitting: Family Medicine

## 2019-08-26 DIAGNOSIS — M48061 Spinal stenosis, lumbar region without neurogenic claudication: Secondary | ICD-10-CM | POA: Diagnosis not present

## 2019-08-26 DIAGNOSIS — G8929 Other chronic pain: Secondary | ICD-10-CM | POA: Diagnosis not present

## 2019-08-26 DIAGNOSIS — M545 Low back pain: Secondary | ICD-10-CM

## 2019-08-26 NOTE — Telephone Encounter (Signed)
Message has been sent to our referral coordinator as the order has already been placed.

## 2019-08-26 NOTE — Progress Notes (Signed)
This visit type was conducted due to national recommendations for restrictions regarding the COVID-19 pandemic in an effort to limit this patient's exposure and mitigate transmission in our community.   Virtual Visit via Video Note  I connected with Joshua Burns on 08/26/19 at  8:00 AM EDT by a video enabled telemedicine application and verified that I am speaking with the correct person using two identifiers.  Location patient: home Location provider:work or home office Persons participating in the virtual visit: patient, provider, and patient's daughter-Joshua Burns  I discussed the limitations of evaluation and management by telemedicine and the availability of in person appointments. The patient expressed understanding and agreed to proceed.   HPI: Joshua Burns has chronic problems including history of sick sinus syndrome, second-degree Mobitz AV block, hyperlipidemia, glaucoma, hypertension, history of CAD.  Has had some chronic intermittent low back pain.  He has known degenerative changes on prior x-rays which were done last December.  He states that he started having fairly severe pain last week but his pain is actually improved some today.  He describes bilateral achy pain with radiation into the buttocks bilaterally worse with walking.  He has some stiffness when he first gets up to move.  He denies any muscle weakness or any numbness.  No loss of urine or stool control.  No saddle anesthesia.  Did not take any medications for his pain last week.  No dysuria.  No recent falls.  Does have increased risk of falls.  Denies any past history of back surgeries.  Family had called yesterday requesting home physical therapy be set up.  Patient does have some difficulty with transfers and does not drive himself very much and has some transportation issues and also increased risk of falls and transfers.  They have benefited from home physical therapy in the past.   Lumbar spine film  12-03-18:  EXAM: LUMBAR SPINE - COMPLETE 4+ VIEW  COMPARISON:  [facility] CT Abdomen and Pelvis 02/12/2008. Chest radiographs 03/31/2004.  FINDINGS: Twelve pairs of ribs suggested on the 2005 comparison resulting in a partially sacralized L5 level with a left side L5-S1 assimilation joint. Preserved vertebral height and alignment. Moderate to severe disc space loss L2-L3 through L4-L5 with bulky endplate spurring. No pars fracture. Moderate facet hypertrophy throughout the lumbar spine. Sclerosis of the left L5-S1 assimilation joint. SI joints within normal limits. No acute osseous abnormality identified. Aortoiliac calcified atherosclerosis. Negative visible of abdominal visceral contours. Partially visible cardiac pacemaker lead.    IMPRESSION: 1. No acute osseous abnormality identified in the lumbar spine. Partially sacralized L5 level with mildly degenerated left L5-S1 assimilation joint. 2. Advanced chronic disc and endplate degeneration 624THL through L4-L5. Moderate facet degeneration throughout the lumbar spine. 3.  Aortic Atherosclerosis (ICD10-I70.0).   ROS: See pertinent positives and negatives per HPI.  Past Medical History:  Diagnosis Date  . Alzheimer disease ([place])   . Arthritis    "knees" (11/28/2017)  . CAD (coronary artery disease)   . Glaucoma, both eyes   . HYPERLIPIDEMIA 01/28/2010  . HYPERTENSION 01/28/2010  . HYPERTROPHY PROSTATE W/UR OBST & OTH LUTS 01/28/2010  . Presence of permanent cardiac pacemaker 11/28/2017    Past Surgical History:  Procedure Laterality Date  . ANTERIOR CERVICAL DECOMP/DISCECTOMY FUSION  02/2003   Archie Endo 05/08/2011  . BRAIN SURGERY    . CARDIOVASCULAR STRESS TEST  08-04-2008   EF 0%  . CATARACT EXTRACTION W/ INTRAOCULAR LENS  IMPLANT, BILATERAL Bilateral   . CORONARY ANGIOPLASTY  01/2003   /  notes 05/08/2011  . INGUINAL HERNIA REPAIR Right 03/2004   Archie Endo 05/08/2011  . INSERT / REPLACE / REMOVE PACEMAKER   11/28/2017  . KNEE ARTHROSCOPY Left 09/2011   Archie Endo 09/26/2011  . LOOP RECORDER INSERTION N/A 08/20/2017   Procedure: LOOP RECORDER INSERTION;  Surgeon: Sanda Klein, MD;  Location: [place] [facility];  Service: Cardiovascular;  Laterality: N/A;  . LOOP RECORDER REMOVAL  11/28/2017  . LOOP RECORDER REMOVAL N/A 11/28/2017   Procedure: LOOP RECORDER REMOVAL;  Surgeon: Sanda Klein, MD;  Location: [place] [facility];  Service: Cardiovascular;  Laterality: N/A;  . PACEMAKER IMPLANT N/A 11/28/2017   Procedure: PACEMAKER IMPLANT - DUAL CHAMBER;  Surgeon: Sanda Klein, MD;  Location: [place] [facility];  Service: Cardiovascular;  Laterality: N/A;  . PROSTATE BIOPSY  03/2008  . US ECHOCARDIOGRAPHY  08-04-2008   Est EF 55-60%    Family History  Problem Relation Age of Onset  . Heart disease Mother   . Heart disease Father   . Aneurysm Brother     SOCIAL HX: Non-smoker.  Widowed.  Very supportive family.   Current Outpatient Medications:  .  amLODipine-benazepril (LOTREL) 5-20 MG capsule, Take 1 capsule by mouth daily., Disp: 90 capsule, Rfl: 3 .  apixaban (ELIQUIS) 2.5 MG TABS tablet, Take 1 tablet (2.5 mg total) by mouth 2 (two) times daily., Disp: 180 tablet, Rfl: 3 .  donepezil (ARICEPT) 10 MG tablet, TAKE 1 TABLET BY MOUTH EVERYDAY AT BEDTIME, Disp: 90 tablet, Rfl: 1 .  hydrochlorothiazide (HYDRODIURIL) 12.5 MG tablet, Take 1 tablet (12.5 mg total) by mouth daily., Disp: 90 tablet, Rfl: 3 .  KLOR-CON M20 20 MEQ tablet, TAKE 1 TABLET BY MOUTH EVERY DAY, Disp: 90 tablet, Rfl: 2 .  metoprolol tartrate (LOPRESSOR) 25 MG tablet, Take 1 tablet (25 mg total) by mouth 2 (two) times daily., Disp: 180 tablet, Rfl: 3 .  simvastatin (ZOCOR) 20 MG tablet, TAKE 1 TABLET BY MOUTH EVERYDAY AT BEDTIME, Disp: 90 tablet, Rfl: 0  EXAM:  VITALS per patient if applicable:  GENERAL: alert, oriented, appears well and in no acute distress  HEENT: atraumatic, conjunttiva clear, no obvious abnormalities  on inspection of external nose and ears  NECK: normal movements of the head and neck  LUNGS: on inspection no signs of respiratory distress, breathing rate appears normal, no obvious gross SOB, gasping or wheezing  CV: no obvious cyanosis  MS: moves all visible extremities without noticeable abnormality  PSYCH/NEURO: pleasant and cooperative, no obvious depression or anxiety, speech and thought processing grossly intact  ASSESSMENT AND PLAN:  Discussed the following assessment and plan:  Chronic bilateral low back pain, unspecified whether sciatica present - Plan: Ambulatory referral to [place]  Spinal stenosis of lumbar region, unspecified whether neurogenic claudication present - Plan: Ambulatory referral to [place]  Patient does not have any red flags such as reported weight loss, fever, or any specific neurologic deficits.  Suspect he has significant lumbar stenosis.  We will set up home physical therapy.  Patient has had epidural injections in the past with some benefit and consider referral back to orthopedics if he continues to have pains not improved with physical therapy    I discussed the assessment and treatment plan with the patient. The patient was provided an opportunity to ask questions and all were answered. The patient agreed with the plan and demonstrated an understanding of the instructions.   The patient was advised to call back or seek an in-person evaluation if  the symptoms worsen or if the condition fails to improve as anticipated.     Carolann Littler, MD

## 2019-08-26 NOTE — Telephone Encounter (Signed)
Pt's daughter called in to update provider that the home health agency name is [facility]

## 2019-08-29 ENCOUNTER — Telehealth: Payer: Self-pay | Admitting: Family Medicine

## 2019-08-29 DIAGNOSIS — M5136 Other intervertebral disc degeneration, lumbar region: Secondary | ICD-10-CM | POA: Diagnosis not present

## 2019-08-29 DIAGNOSIS — I2510 Atherosclerotic heart disease of native coronary artery without angina pectoris: Secondary | ICD-10-CM | POA: Diagnosis not present

## 2019-08-29 DIAGNOSIS — M545 Low back pain, unspecified: Secondary | ICD-10-CM

## 2019-08-29 DIAGNOSIS — M48061 Spinal stenosis, lumbar region without neurogenic claudication: Secondary | ICD-10-CM | POA: Diagnosis not present

## 2019-08-29 DIAGNOSIS — F0280 Dementia in other diseases classified elsewhere without behavioral disturbance: Secondary | ICD-10-CM | POA: Diagnosis not present

## 2019-08-29 DIAGNOSIS — I10 Essential (primary) hypertension: Secondary | ICD-10-CM | POA: Diagnosis not present

## 2019-08-29 DIAGNOSIS — G309 Alzheimer's disease, unspecified: Secondary | ICD-10-CM | POA: Diagnosis not present

## 2019-08-29 DIAGNOSIS — M170 Bilateral primary osteoarthritis of knee: Secondary | ICD-10-CM | POA: Diagnosis not present

## 2019-08-29 DIAGNOSIS — H409 Unspecified glaucoma: Secondary | ICD-10-CM | POA: Diagnosis not present

## 2019-08-29 DIAGNOSIS — Z9181 History of falling: Secondary | ICD-10-CM | POA: Diagnosis not present

## 2019-08-29 NOTE — Telephone Encounter (Signed)
Please see message. °

## 2019-08-29 NOTE — Telephone Encounter (Signed)
See note

## 2019-08-29 NOTE — Telephone Encounter (Signed)
Order placed.  Should be on printer machine.

## 2019-08-29 NOTE — Telephone Encounter (Signed)
[facility] with [facility] and LMOVM to return call  [place] for [facility] to Discuss results / PCP / recommendations / Schedule patient  Please advise the fax number for the PT orders and where we are faxing orders to, is it [facility] or [place]? Please advise.  CRM Created.

## 2019-08-29 NOTE — Telephone Encounter (Signed)
Copied from [place] [phone]. Topic: General - Other >> Aug 29, 2019 11:05 AM Jodie Echevaria wrote: Reason for CRM: Clifton James PT with [facility] called to inform Dr Elease Hashimoto that the patient was evaluated today and is a candidate for Outpatient PT so he is asking for Dr Elease Hashimoto to please fax over orders for patient to have his PT at [facility] so that they can work with him on managing his lower back pain. Any questions Clifton James can be reached at Ph# [phone]

## 2019-09-10 ENCOUNTER — Ambulatory Visit (INDEPENDENT_AMBULATORY_CARE_PROVIDER_SITE_OTHER): Payer: Medicare Other | Admitting: *Deleted

## 2019-09-10 DIAGNOSIS — I495 Sick sinus syndrome: Secondary | ICD-10-CM | POA: Diagnosis not present

## 2019-09-10 LAB — CUP PACEART REMOTE DEVICE CHECK
Battery Remaining Longevity: 121 mo
Battery Voltage: 3 V
Brady Statistic AP VP Percent: 73.54 %
Brady Statistic AP VS Percent: 22.65 %
Brady Statistic AS VP Percent: 0.4 %
Brady Statistic AS VS Percent: 3.4 %
Brady Statistic RA Percent Paced: 96.67 %
Brady Statistic RV Percent Paced: 73.95 %
Date Time Interrogation Session: 20200916045716
Implantable Lead Implant Date: 20181205
Implantable Lead Implant Date: 20181205
Implantable Lead Location: 753859
Implantable Lead Location: 753860
Implantable Lead Model: 5076
Implantable Lead Model: 5076
Implantable Pulse Generator Implant Date: 20181205
Lead Channel Impedance Value: 285 Ohm
Lead Channel Impedance Value: 304 Ohm
Lead Channel Impedance Value: 437 Ohm
Lead Channel Impedance Value: 456 Ohm
Lead Channel Pacing Threshold Amplitude: 0.625 V
Lead Channel Pacing Threshold Amplitude: 1.125 V
Lead Channel Pacing Threshold Pulse Width: 0.4 ms
Lead Channel Pacing Threshold Pulse Width: 0.4 ms
Lead Channel Sensing Intrinsic Amplitude: 1.75 mV
Lead Channel Sensing Intrinsic Amplitude: 1.75 mV
Lead Channel Sensing Intrinsic Amplitude: 5.75 mV
Lead Channel Sensing Intrinsic Amplitude: 5.75 mV
Lead Channel Setting Pacing Amplitude: 1.5 V
Lead Channel Setting Pacing Amplitude: 2.5 V
Lead Channel Setting Pacing Pulse Width: 0.4 ms
Lead Channel Setting Sensing Sensitivity: 0.9 mV

## 2019-09-16 ENCOUNTER — Encounter: Payer: Self-pay | Admitting: Cardiology

## 2019-09-16 NOTE — Progress Notes (Signed)
Remote pacemaker transmission.   

## 2019-09-22 NOTE — Telephone Encounter (Signed)
Joshua Burns with [facility] services called to check on request for patient to be referred to an outpatient rehab at [place]. States that the daughter [name] is concerned that she have not heard anything from the office. Also Clifton James does not have the fax # for [place] Also after order have been sent to [place] please contact [name] at [employer] [phone]

## 2019-09-29 ENCOUNTER — Other Ambulatory Visit: Payer: Self-pay | Admitting: Cardiology

## 2019-09-30 ENCOUNTER — Other Ambulatory Visit: Payer: Self-pay

## 2019-09-30 ENCOUNTER — Ambulatory Visit (INDEPENDENT_AMBULATORY_CARE_PROVIDER_SITE_OTHER): Payer: Medicare Other

## 2019-09-30 DIAGNOSIS — Z23 Encounter for immunization: Secondary | ICD-10-CM | POA: Diagnosis not present

## 2019-10-12 ENCOUNTER — Other Ambulatory Visit: Payer: Self-pay | Admitting: Cardiology

## 2019-11-03 ENCOUNTER — Other Ambulatory Visit: Payer: Self-pay | Admitting: Cardiology

## 2019-11-05 NOTE — Progress Notes (Signed)
Virtual Visit via Telephone Note   This visit type was conducted due to national recommendations for restrictions regarding the COVID-19 Pandemic (e.g. social distancing) in an effort to limit this patient's exposure and mitigate transmission in our community.  Due to his co-morbid illnesses, this patient is at least at moderate risk for complications without adequate follow up.  This format is felt to be most appropriate for this patient at this time.  The patient did not have access to video technology/had technical difficulties with video requiring transitioning to audio format only (telephone).  All issues noted in this document were discussed and addressed.  No physical exam could be performed with this format.  Please refer to the patient's chart for his  consent to telehealth for [facility].   Date:  11/06/2019   ID:  Joshua Burns, DOB 1925-06-29, MRN [id number]  Patient Location: Home Provider Location: Home  PCP:  Eulas Post, MD  Cardiologist:  Dr.Croitoru  Electrophysiologist:  None   Evaluation Performed:  Follow-Up Visit  Chief Complaint:  Follow up  History of Present Illness:    Joshua Burns is a 85 y.o. male with we are following for ongoing assessment and management of syncope due to sinus pauses, paroxysmal atrial fibrillation with rapid ventricular response, status post dual-chamber pacemaker plantation in December 2018.  He has pacemaker interrogations per protocol with most recent completed on 09/10/2019.  Other history includes hypertension, hyperlipidemia, with normal left ventricular systolic function the normal stress test in the remote past. He remains on anticoagulation with CHADSVasc 3   He was last seen in the office by Dr. Sallyanne Kuster on 03/13/2018, at that time the patient was asymptomatic, pacemaker was changed to MVP with a limit of length of PR interval.  There is no evidence of heart failure, he was continued on his current medication regimen  without any changes.  Joshua Burns is doing very well.  Both he and his daughter are on the call with Burns today.  He is medically compliant, he denies any bleeding issues on apixaban.  Blood pressure has not been taken at home as I do not have a blood pressure machine.  However previous office visits has his blood pressure stable.  He remains active, denies any new symptoms.   The patient does not have symptoms concerning for COVID-19 infection (fever, chills, cough, or new shortness of breath).    Past Medical History:  Diagnosis Date  . Alzheimer disease ([place])   . Arthritis    "knees" (11/28/2017)  . CAD (coronary artery disease)   . Glaucoma, both eyes   . HYPERLIPIDEMIA 01/28/2010  . HYPERTENSION 01/28/2010  . HYPERTROPHY PROSTATE W/UR OBST & OTH LUTS 01/28/2010  . Presence of permanent cardiac pacemaker 11/28/2017   Past Surgical History:  Procedure Laterality Date  . ANTERIOR CERVICAL DECOMP/DISCECTOMY FUSION  02/2003   Archie Endo 05/08/2011  . BRAIN SURGERY    . CARDIOVASCULAR STRESS TEST  08-04-2008   EF 0%  . CATARACT EXTRACTION W/ INTRAOCULAR LENS  IMPLANT, BILATERAL Bilateral   . CORONARY ANGIOPLASTY  01/2003   Archie Endo 05/08/2011  . INGUINAL HERNIA REPAIR Right 03/2004   Archie Endo 05/08/2011  . INSERT / REPLACE / REMOVE PACEMAKER  11/28/2017  . KNEE ARTHROSCOPY Left 09/2011   Archie Endo 09/26/2011  . LOOP RECORDER INSERTION N/A 08/20/2017   Procedure: LOOP RECORDER INSERTION;  Surgeon: Sanda Klein, MD;  Location: [place] [facility];  Service: Cardiovascular;  Laterality: N/A;  . LOOP RECORDER REMOVAL  11/28/2017  .  LOOP RECORDER REMOVAL N/A 11/28/2017   Procedure: LOOP RECORDER REMOVAL;  Surgeon: Sanda Klein, MD;  Location: [place] [facility];  Service: Cardiovascular;  Laterality: N/A;  . PACEMAKER IMPLANT N/A 11/28/2017   Procedure: PACEMAKER IMPLANT - DUAL CHAMBER;  Surgeon: Sanda Klein, MD;  Location: [place] [facility];  Service: Cardiovascular;  Laterality: N/A;  . PROSTATE  BIOPSY  03/2008  . US ECHOCARDIOGRAPHY  08-04-2008   Est EF 55-60%     Current Meds  Medication Sig  . amLODipine-benazepril (LOTREL) 5-20 MG capsule Take 1 capsule by mouth daily.  Marland Kitchen apixaban (ELIQUIS) 2.5 MG TABS tablet Take 1 tablet (2.5 mg total) by mouth 2 (two) times daily.  Marland Kitchen donepezil (ARICEPT) 10 MG tablet TAKE 1 TABLET BY MOUTH EVERYDAY AT BEDTIME  . hydrochlorothiazide (HYDRODIURIL) 12.5 MG tablet Take 1 tablet (12.5 mg total) by mouth daily.  Marland Kitchen KLOR-CON M20 20 MEQ tablet TAKE 1 TABLET BY MOUTH EVERY DAY  . metoprolol tartrate (LOPRESSOR) 25 MG tablet Take 1 tablet (25 mg total) by mouth 2 (two) times daily.  . Multiple Vitamin (MULTIVITAMIN WITH MINERALS) TABS tablet Take 1 tablet by mouth daily.  . simvastatin (ZOCOR) 20 MG tablet TAKE 1 TABLET BY MOUTH EVERYDAY AT BEDTIME  . [DISCONTINUED] hydrochlorothiazide (HYDRODIURIL) 12.5 MG tablet Take 1 tablet (12.5 mg total) by mouth daily.     Allergies:   Ace inhibitors and Diltiazem   Social History   Tobacco Use  . Smoking status: Former Smoker    Packs/day: 1.00    Years: 38.00    Pack years: 38.00    Types: Cigarettes    Quit date: 12/25/1978    Years since quitting: 40.8  . Smokeless tobacco: Never Used  Substance Use Topics  . Alcohol use: No    Comment: does not   . Drug use: No     Family Hx: The patient's family history includes Aneurysm in his brother; Heart disease in his father and mother.  ROS:   Please see the history of present illness.    All other systems reviewed and are negative.   Prior CV studies:   The following studies were reviewed today:   Labs/Other Tests and Data Reviewed:    EKG: Not completed this office visit.  Recent Labs: 12/09/2018: ALT 7; BUN 15; Creatinine, Ser 1.29; Hemoglobin 10.6; Platelets 64.0; Potassium 4.2; Sodium 141   Recent Lipid Panel Lab Results  Component Value Date/Time   CHOL 117 12/09/2018 11:10 AM   TRIG 84.0 12/09/2018 11:10 AM   HDL 36.30 (L)  12/09/2018 11:10 AM   CHOLHDL 3 12/09/2018 11:10 AM   LDLCALC 64 12/09/2018 11:10 AM    Wt Readings from Last 3 Encounters:  11/06/19 138 lb 11.2 oz (62.9 kg)  03/05/19 141 lb 12.8 oz (64.3 kg)  12/09/18 140 lb (63.5 kg)     Objective:    Vital Signs:  Ht 5\' 4"  (1.626 m)   Wt 138 lb 11.2 oz (62.9 kg)   BMI 23.81 kg/m    VITAL SIGNS:  reviewed GEN:  no acute distress RESPIRATORY:  normal respiratory effort, symmetric expansion NEURO:  alert and oriented x 3, no obvious focal deficit PSYCH:  normal affect  ASSESSMENT & PLAN:    1. Hypertension: Currently well controlled based on previous office visits.  He remains medically compliant with the help of his daughter.  I will have a blood pressure cuff sent to his home so that they can track his blood pressure.  I have explained that because of his age and the amount of medications that he is taking, I want to avoid hypotension especially being on Eliquis.  They verbalized understanding.  They will send [place] my chart messages if his blood pressure begins to get too low.  He is given refills on HCTZ 12.5 mg.  If he becomes hypotensive this will be discontinued.  2.  Paroxysmal atrial fibrillation: Remains on Eliquis 2.5 mg twice daily.  He is asymptomatic concerning bleeding, excessive bruising, or melena.  His daughter is aware to noticed these symptoms and report them should they occur.  3.  Hyperlipidemia: Remains on statin therapy.  Labs are completed by his PCP.  If they are not done by the time we see him again in 6 months these will be redrawn.  COVID-19 Education: The signs and symptoms of COVID-19 were discussed with the patient and how to seek care for testing (follow up with PCP or arrange E-visit).  The importance of social distancing was discussed today.  Time:   Today, I have spent 15 minutes with the patient with telehealth technology discussing the above problems.     Medication Adjustments/Labs and Tests Ordered:  Current medicines are reviewed at length with the patient today.  Concerns regarding medicines are outlined above.   Tests Ordered: No orders of the defined types were placed in this encounter.   Medication Changes: Meds ordered this encounter  Medications  . hydrochlorothiazide (HYDRODIURIL) 12.5 MG tablet    Sig: Take 1 tablet (12.5 mg total) by mouth daily.    Dispense:  90 tablet    Refill:  3    Disposition:  Follow up 6 months   Signed, Phill Myron. West Pugh, ANP, AACC  11/06/2019 9:04 AM    [facility]

## 2019-11-06 ENCOUNTER — Telehealth (INDEPENDENT_AMBULATORY_CARE_PROVIDER_SITE_OTHER): Payer: Medicare Other | Admitting: Adult Health

## 2019-11-06 ENCOUNTER — Encounter: Payer: Self-pay | Admitting: Adult Health

## 2019-11-06 ENCOUNTER — Telehealth: Payer: Self-pay | Admitting: Licensed Clinical Social Worker

## 2019-11-06 VITALS — Ht 64.0 in | Wt 138.7 lb

## 2019-11-06 DIAGNOSIS — Z950 Presence of cardiac pacemaker: Secondary | ICD-10-CM

## 2019-11-06 DIAGNOSIS — E7800 Pure hypercholesterolemia, unspecified: Secondary | ICD-10-CM

## 2019-11-06 DIAGNOSIS — I480 Paroxysmal atrial fibrillation: Secondary | ICD-10-CM

## 2019-11-06 DIAGNOSIS — I10 Essential (primary) hypertension: Secondary | ICD-10-CM | POA: Diagnosis not present

## 2019-11-06 MED ORDER — HYDROCHLOROTHIAZIDE 12.5 MG PO TABS: 12.5000 mg | ORAL_TABLET | Freq: Every day | ORAL | 3 refills | Status: DC

## 2019-11-06 NOTE — Patient Instructions (Signed)
Medication Instructions:  Continue current medications  *If you need a refill on your cardiac medications before your next appointment, please call your pharmacy*  Lab Work: None Ordered  Testing/Procedures: None Ordered  Follow-Up: At [facility], you and your health needs are our priority.  As part of our continuing mission to provide you with exceptional heart care, we have created designated Provider Care Teams.  These Care Teams include your primary Cardiologist (physician) and Advanced Practice Providers (APPs -  Physician Assistants and Nurse Practitioners) who all work together to provide you with the care you need, when you need it.  Your next appointment:   6 months  The format for your next appointment:   In Person  Provider:   Sanda Klein, MD

## 2019-11-06 NOTE — Telephone Encounter (Signed)
CSW referred to assist patient with obtaining a BP cuff. CSW contacted patient to inform cuff will be delivered to home. Patient grateful for support and assistance. CSW available as needed. Jackie Chanta Bauers, LCSW, CCSW-MCS [phone]  

## 2019-11-18 ENCOUNTER — Other Ambulatory Visit: Payer: Self-pay

## 2019-11-26 ENCOUNTER — Other Ambulatory Visit: Payer: Self-pay | Admitting: Cardiovascular Disease

## 2019-12-10 ENCOUNTER — Ambulatory Visit (INDEPENDENT_AMBULATORY_CARE_PROVIDER_SITE_OTHER): Payer: Medicare Other | Admitting: *Deleted

## 2019-12-10 DIAGNOSIS — I441 Atrioventricular block, second degree: Secondary | ICD-10-CM

## 2019-12-11 LAB — CUP PACEART REMOTE DEVICE CHECK
Battery Remaining Longevity: 113 mo
Battery Voltage: 3 V
Brady Statistic AP VP Percent: 77.09 %
Brady Statistic AP VS Percent: 17.05 %
Brady Statistic AS VP Percent: 0.97 %
Brady Statistic AS VS Percent: 4.9 %
Brady Statistic RA Percent Paced: 95.01 %
Brady Statistic RV Percent Paced: 78.06 %
Date Time Interrogation Session: 20201215235603
Implantable Lead Implant Date: 20181205
Implantable Lead Implant Date: 20181205
Implantable Lead Location: 753859
Implantable Lead Location: 753860
Implantable Lead Model: 5076
Implantable Lead Model: 5076
Implantable Pulse Generator Implant Date: 20181205
Lead Channel Impedance Value: 285 Ohm
Lead Channel Impedance Value: 323 Ohm
Lead Channel Impedance Value: 380 Ohm
Lead Channel Impedance Value: 418 Ohm
Lead Channel Pacing Threshold Amplitude: 0.75 V
Lead Channel Pacing Threshold Amplitude: 0.875 V
Lead Channel Pacing Threshold Pulse Width: 0.4 ms
Lead Channel Pacing Threshold Pulse Width: 0.4 ms
Lead Channel Sensing Intrinsic Amplitude: 1.25 mV
Lead Channel Sensing Intrinsic Amplitude: 1.25 mV
Lead Channel Sensing Intrinsic Amplitude: 5.875 mV
Lead Channel Sensing Intrinsic Amplitude: 5.875 mV
Lead Channel Setting Pacing Amplitude: 1.5 V
Lead Channel Setting Pacing Amplitude: 2.5 V
Lead Channel Setting Pacing Pulse Width: 0.4 ms
Lead Channel Setting Sensing Sensitivity: 0.9 mV

## 2019-12-12 ENCOUNTER — Other Ambulatory Visit: Payer: Self-pay | Admitting: Family Medicine

## 2019-12-16 ENCOUNTER — Other Ambulatory Visit: Payer: Self-pay | Admitting: Family Medicine

## 2019-12-16 NOTE — Telephone Encounter (Signed)
Requested medication (s) are due for refill today: yes  Requested medication (s) are on the active medication list: yes  Last refill:  11/26/2019  Future visit scheduled: no  Notes to clinic:  last filled by a different provider Review for refill   Requested Prescriptions  Pending Prescriptions Disp Refills   simvastatin (ZOCOR) 20 MG tablet 30 tablet 1      Cardiovascular:  Antilipid - Statins Failed - 12/16/2019  1:57 PM      Failed - Total Cholesterol in normal range and within 360 days    Cholesterol  Date Value Ref Range Status  12/09/2018 117 0 - 200 mg/dL Final    Comment:    ATP III Classification       Desirable:  < 200 mg/dL               Borderline High:  200 - 239 mg/dL          High:  > = 240 mg/dL          Failed - LDL in normal range and within 360 days    LDL Cholesterol  Date Value Ref Range Status  12/09/2018 64 0 - 99 mg/dL Final          Failed - HDL in normal range and within 360 days    HDL  Date Value Ref Range Status  12/09/2018 36.30 (L) >39.00 mg/dL Final          Failed - Triglycerides in normal range and within 360 days    Triglycerides  Date Value Ref Range Status  12/09/2018 84.0 0.0 - 149.0 mg/dL Final    Comment:    Normal:  <150 mg/dLBorderline High:  150 - 199 mg/dL          Passed - Patient is not pregnant      Passed - Valid encounter within last 12 months    Recent Outpatient Visits           3 months ago Chronic bilateral low back pain, unspecified whether sciatica present   Therapist, [facility] at [facility], Alinda Sierras, MD   6 months ago Non-traumatic subconjunctival hemorrhage of left eye   Therapist, [facility] at [facility], Alinda Sierras, MD   9 months ago Acute bronchitis, unspecified organism   Therapist, music at [facility], Alinda Sierras, MD   1 year ago Essential hypertension   Therapist, music at [facility], Alinda Sierras, MD   1 year ago Acute bilateral low back pain with left-sided  sciatica   Greer Worley, [place], [place]

## 2019-12-16 NOTE — Telephone Encounter (Signed)
I see that Dr. Sanda Klein MD has been filling this medication.  OK to refill or does Dr. Sallyanne Kuster need to continue filling?

## 2019-12-16 NOTE — Telephone Encounter (Signed)
Refill per Cardiology

## 2019-12-16 NOTE — Telephone Encounter (Signed)
Message routed to PCP CMA  

## 2019-12-16 NOTE — Telephone Encounter (Signed)
Medication Refill - Medication: simvastatin (ZOCOR) 20 MG tablet [id number]     Preferred Pharmacy (with phone number or street name):  [id number] - [place], [place]. AT [address]  [address] [place] [address]  Phone: [phone] Fax: [phone]     Agent: Please be advised that RX refills may take up to 3 business days. We ask that you follow-up with your pharmacy.

## 2019-12-17 NOTE — Telephone Encounter (Signed)
Rx denial sent with note as below.

## 2019-12-22 ENCOUNTER — Encounter: Payer: Self-pay | Admitting: Family Medicine

## 2020-01-09 ENCOUNTER — Other Ambulatory Visit: Payer: Self-pay

## 2020-01-09 ENCOUNTER — Encounter: Payer: Self-pay | Admitting: Family Medicine

## 2020-01-09 MED ORDER — SIMVASTATIN 20 MG PO TABS: ORAL_TABLET | ORAL | 2 refills | Status: DC

## 2020-03-10 ENCOUNTER — Other Ambulatory Visit: Payer: Self-pay | Admitting: Cardiovascular Disease

## 2020-03-10 ENCOUNTER — Ambulatory Visit (INDEPENDENT_AMBULATORY_CARE_PROVIDER_SITE_OTHER): Payer: Medicare Other | Admitting: *Deleted

## 2020-03-10 DIAGNOSIS — I441 Atrioventricular block, second degree: Secondary | ICD-10-CM

## 2020-03-10 LAB — CUP PACEART REMOTE DEVICE CHECK
Battery Remaining Longevity: 113 mo
Battery Voltage: 3 V
Brady Statistic AP VP Percent: 74.95 %
Brady Statistic AP VS Percent: 19.8 %
Brady Statistic AS VP Percent: 0.84 %
Brady Statistic AS VS Percent: 4.42 %
Brady Statistic RA Percent Paced: 95.11 %
Brady Statistic RV Percent Paced: 75.78 %
Date Time Interrogation Session: 20210317005428
Implantable Lead Implant Date: 20181205
Implantable Lead Implant Date: 20181205
Implantable Lead Location: 753859
Implantable Lead Location: 753860
Implantable Lead Model: 5076
Implantable Lead Model: 5076
Implantable Pulse Generator Implant Date: 20181205
Lead Channel Impedance Value: 266 Ohm
Lead Channel Impedance Value: 285 Ohm
Lead Channel Impedance Value: 418 Ohm
Lead Channel Impedance Value: 456 Ohm
Lead Channel Pacing Threshold Amplitude: 0.5 V
Lead Channel Pacing Threshold Amplitude: 1.25 V
Lead Channel Pacing Threshold Pulse Width: 0.4 ms
Lead Channel Pacing Threshold Pulse Width: 0.4 ms
Lead Channel Sensing Intrinsic Amplitude: 1.375 mV
Lead Channel Sensing Intrinsic Amplitude: 1.375 mV
Lead Channel Sensing Intrinsic Amplitude: 5.75 mV
Lead Channel Sensing Intrinsic Amplitude: 5.75 mV
Lead Channel Setting Pacing Amplitude: 1.5 V
Lead Channel Setting Pacing Amplitude: 2.5 V
Lead Channel Setting Pacing Pulse Width: 0.4 ms
Lead Channel Setting Sensing Sensitivity: 0.9 mV

## 2020-03-10 NOTE — Progress Notes (Signed)
PPM Remote  

## 2020-04-19 ENCOUNTER — Telehealth: Payer: Self-pay | Admitting: *Deleted

## 2020-04-19 MED ORDER — POTASSIUM CHLORIDE CRYS ER 20 MEQ PO TBCR: 20.0000 meq | EXTENDED_RELEASE_TABLET | Freq: Every day | ORAL | 0 refills | Status: DC

## 2020-04-19 NOTE — Telephone Encounter (Signed)
Sent in one refill tried calling pt to setup appt unable to leave voicemail

## 2020-04-19 NOTE — Telephone Encounter (Signed)
Please advise 

## 2020-04-19 NOTE — Telephone Encounter (Signed)
Pt is scheduled for follow up on May 4 at 11:00am

## 2020-04-19 NOTE — Addendum Note (Signed)
Addended by: Modena Morrow R on: 04/19/2020 01:07 PM   Modules accepted: Orders

## 2020-04-19 NOTE — Telephone Encounter (Signed)
Patient daughter called the after hours line. Daughter reports her father needs a refill on Klor-Con M20 tablet. This medication was prescribed by another MD. Please advise

## 2020-04-19 NOTE — Telephone Encounter (Signed)
Overdue for follow-up labs.  Refill once and set up follow-up and we can get labs when he comes in office

## 2020-04-26 ENCOUNTER — Telehealth: Payer: Self-pay | Admitting: Family Medicine

## 2020-04-26 NOTE — Telephone Encounter (Signed)
Pt has been scheduled. Rx denied for today.

## 2020-04-26 NOTE — Telephone Encounter (Signed)
donepezil (ARICEPT) 10 MG tablet  hydrochlorothiazide (HYDRODIURIL) 12.5 MG tablet    [id number] [place], [place] - [address] AT [address] [place] Phone:  [phone]  Fax:  [phone]

## 2020-04-27 ENCOUNTER — Other Ambulatory Visit: Payer: Self-pay

## 2020-04-27 ENCOUNTER — Ambulatory Visit (INDEPENDENT_AMBULATORY_CARE_PROVIDER_SITE_OTHER): Payer: Medicare Other | Admitting: Family Medicine

## 2020-04-27 ENCOUNTER — Encounter: Payer: Self-pay | Admitting: Family Medicine

## 2020-04-27 VITALS — BP 130/60 | HR 93 | Temp 98.2°F | Ht 64.0 in | Wt 138.0 lb

## 2020-04-27 DIAGNOSIS — I10 Essential (primary) hypertension: Secondary | ICD-10-CM

## 2020-04-27 DIAGNOSIS — I480 Paroxysmal atrial fibrillation: Secondary | ICD-10-CM

## 2020-04-27 DIAGNOSIS — Z79899 Other long term (current) drug therapy: Secondary | ICD-10-CM

## 2020-04-27 DIAGNOSIS — E7800 Pure hypercholesterolemia, unspecified: Secondary | ICD-10-CM | POA: Diagnosis not present

## 2020-04-27 LAB — HEPATIC FUNCTION PANEL
ALT: 8 U/L (ref 0–53)
AST: 15 U/L (ref 0–37)
Albumin: 4.6 g/dL (ref 3.5–5.2)
Alkaline Phosphatase: 63 U/L (ref 39–117)
Bilirubin, Direct: 0.1 mg/dL (ref 0.0–0.3)
Total Bilirubin: 0.6 mg/dL (ref 0.2–1.2)
Total Protein: 7.4 g/dL (ref 6.0–8.3)

## 2020-04-27 LAB — CBC WITH DIFFERENTIAL/PLATELET
Basophils Absolute: 0 10*3/uL (ref 0.0–0.1)
Basophils Relative: 0.9 % (ref 0.0–3.0)
Eosinophils Absolute: 0 10*3/uL (ref 0.0–0.7)
Eosinophils Relative: 0.6 % (ref 0.0–5.0)
HCT: 29.9 % — ABNORMAL LOW (ref 39.0–52.0)
Hemoglobin: 10.2 g/dL — ABNORMAL LOW (ref 13.0–17.0)
Lymphocytes Relative: 34 % (ref 12.0–46.0)
Lymphs Abs: 1.6 10*3/uL (ref 0.7–4.0)
MCHC: 34 g/dL (ref 30.0–36.0)
MCV: 88.6 fl (ref 78.0–100.0)
Monocytes Absolute: 0.6 10*3/uL (ref 0.1–1.0)
Monocytes Relative: 12.7 % — ABNORMAL HIGH (ref 3.0–12.0)
Neutro Abs: 2.4 10*3/uL (ref 1.4–7.7)
Neutrophils Relative %: 51.8 % (ref 43.0–77.0)
Platelets: 86 10*3/uL — ABNORMAL LOW (ref 150.0–400.0)
RBC: 3.38 Mil/uL — ABNORMAL LOW (ref 4.22–5.81)
RDW: 14.9 % (ref 11.5–15.5)
WBC: 4.6 10*3/uL (ref 4.0–10.5)

## 2020-04-27 LAB — BASIC METABOLIC PANEL
BUN: 16 mg/dL (ref 6–23)
CO2: 32 mEq/L (ref 19–32)
Calcium: 9.6 mg/dL (ref 8.4–10.5)
Chloride: 103 mEq/L (ref 96–112)
Creatinine, Ser: 1.6 mg/dL — ABNORMAL HIGH (ref 0.40–1.50)
GFR: 49.03 mL/min — ABNORMAL LOW (ref >60.00)
Glucose, Bld: 121 mg/dL — ABNORMAL HIGH (ref 70–99)
Potassium: 4 mEq/L (ref 3.5–5.1)
Sodium: 140 mEq/L (ref 135–145)

## 2020-04-27 LAB — LIPID PANEL
Cholesterol: 132 mg/dL (ref 0–200)
HDL: 39.6 mg/dL (ref >39.00)
LDL Cholesterol: 78 mg/dL (ref 0–99)
NonHDL: 92.59
Total CHOL/HDL Ratio: 3
Triglycerides: 71 mg/dL (ref 0.0–149.0)
VLDL: 14.2 mg/dL (ref 0.0–40.0)

## 2020-04-27 NOTE — Patient Instructions (Signed)
Health Maintenance Due  Topic Date Due  . COVID-19 Vaccine (1) Never done    Depression screen [facility] 2/9 12/26/2017 10/31/2016 07/15/2015  Decreased Interest 0 0 0  Down, Depressed, Hopeless - 0 0  PHQ - 2 Score 0 0 0

## 2020-04-27 NOTE — Progress Notes (Signed)
Subjective:     Patient ID: Joshua Burns, male   DOB: [birth year], 86 y.o.   MRN: [id number]  HPI Joshua Burns has history of CAD, hypertension, atrial fibrillation, sick sinus syndrome, cognitive impairment, hyperlipidemia.  He is 86 years old and doing relatively well.  Denies any recent falls.  He states he is compliant with all medications.  He has a pillbox that someone in his family helps set up each week.  His current medications are Lotrel, Aricept, Eliquis, HCTZ, metoprolol, simvastatin, and K-Lor 20 mEq.  He is overdue for labs.  Last labs were December 2019.  He denies any recent appetite or weight changes.  No chest pains.  No dyspnea.  No peripheral edema issues.  Past Medical History:  Diagnosis Date  . Alzheimer disease ([place])   . Arthritis    "knees" (11/28/2017)  . CAD (coronary artery disease)   . Glaucoma, both eyes   . HYPERLIPIDEMIA 01/28/2010  . HYPERTENSION 01/28/2010  . HYPERTROPHY PROSTATE W/UR OBST & OTH LUTS 01/28/2010  . Presence of permanent cardiac pacemaker 11/28/2017   Past Surgical History:  Procedure Laterality Date  . ANTERIOR CERVICAL DECOMP/DISCECTOMY FUSION  02/2003   Archie Endo 05/08/2011  . BRAIN SURGERY    . CARDIOVASCULAR STRESS TEST  08-04-2008   EF 0%  . CATARACT EXTRACTION W/ INTRAOCULAR LENS  IMPLANT, BILATERAL Bilateral   . CORONARY ANGIOPLASTY  01/2003   Archie Endo 05/08/2011  . INGUINAL HERNIA REPAIR Right 03/2004   Archie Endo 05/08/2011  . INSERT / REPLACE / REMOVE PACEMAKER  11/28/2017  . KNEE ARTHROSCOPY Left 09/2011   Archie Endo 09/26/2011  . LOOP RECORDER INSERTION N/A 08/20/2017   Procedure: LOOP RECORDER INSERTION;  Surgeon: Sanda Klein, MD;  Location: [facility];  Service: Cardiovascular;  Laterality: N/A;  . LOOP RECORDER REMOVAL  11/28/2017  . LOOP RECORDER REMOVAL N/A 11/28/2017   Procedure: LOOP RECORDER REMOVAL;  Surgeon: Sanda Klein, MD;  Location: [facility];  Service: Cardiovascular;  Laterality: N/A;  . PACEMAKER IMPLANT  N/A 11/28/2017   Procedure: PACEMAKER IMPLANT - DUAL CHAMBER;  Surgeon: Sanda Klein, MD;  Location: [facility];  Service: Cardiovascular;  Laterality: N/A;  . PROSTATE BIOPSY  03/2008  . US ECHOCARDIOGRAPHY  08-04-2008   Est EF 55-60%    reports that he quit smoking about 41 years ago. His smoking use included cigarettes. He has a 38.00 pack-year smoking history. He has never used smokeless tobacco. He reports that he does not drink alcohol or use drugs. family history includes Aneurysm in his brother; Heart disease in his father and mother. Allergies  Allergen Reactions  . Ace Inhibitors Swelling  . Diltiazem Swelling and Palpitations   Wt Readings from Last 3 Encounters:  04/27/20 138 lb (62.6 kg)  11/06/19 138 lb 11.2 oz (62.9 kg)  03/05/19 141 lb 12.8 oz (64.3 kg)     Review of Systems  Constitutional: Negative for fatigue and unexpected weight change.  Eyes: Negative for visual disturbance.  Respiratory: Negative for cough, chest tightness and shortness of breath.   Cardiovascular: Negative for chest pain, palpitations and leg swelling.  Endocrine: Negative for polydipsia and polyuria.  Genitourinary: Negative for dysuria.  Neurological: Negative for dizziness, syncope, weakness, light-headedness and headaches.       Objective:   Physical Exam Constitutional:      Appearance: He is well-developed.  Eyes:     Pupils: Pupils are equal, round, and reactive to light.  Neck:     Thyroid:  No thyromegaly.  Cardiovascular:     Rate and Rhythm: Normal rate and regular rhythm.  Pulmonary:     Effort: Pulmonary effort is normal. No respiratory distress.     Breath sounds: Normal breath sounds. No wheezing or rales.  Musculoskeletal:     Cervical back: Neck supple.     Right lower leg: No edema.     Left lower leg: No edema.  Neurological:     General: No focal deficit present.     Mental Status: He is alert.     Cranial Nerves: No cranial nerve deficit.         Assessment:     #1 hypertension stable and at goal  #2 history of paroxysmal atrial fibrillation.  Patient maintained on Eliquis.  Appears to be in sinus rhythm today clinically on exam  #3 history of hyperlipidemia.  Past history of CAD.  Goal LDL less than 70  #4 history of cognitive impairment maintained on Aricept.  No behavioral disturbance.  He has excellent family support    Plan:     -Check labs with lipid panel, hepatic panel, basic metabolic panel, CBC  -Continue current medications.  -Discussed fall prevention.  He seems very steady on his feet at this time.  He is able to transfer from chair to table without any assistance  -Routine follow-up in 6 months and sooner as needed  Eulas Post MD [facility] '

## 2020-04-28 ENCOUNTER — Other Ambulatory Visit: Payer: Self-pay

## 2020-04-28 MED ORDER — DONEPEZIL HCL 10 MG PO TABS: ORAL_TABLET | ORAL | 1 refills | Status: DC

## 2020-05-15 ENCOUNTER — Other Ambulatory Visit: Payer: Self-pay | Admitting: Cardiovascular Disease

## 2020-05-30 ENCOUNTER — Other Ambulatory Visit: Payer: Self-pay | Admitting: Cardiovascular Disease

## 2020-06-09 ENCOUNTER — Ambulatory Visit (INDEPENDENT_AMBULATORY_CARE_PROVIDER_SITE_OTHER): Payer: Medicare Other | Admitting: *Deleted

## 2020-06-09 DIAGNOSIS — R55 Syncope and collapse: Secondary | ICD-10-CM

## 2020-06-09 LAB — CUP PACEART REMOTE DEVICE CHECK
Battery Remaining Longevity: 107 mo
Battery Voltage: 2.99 V
Brady Statistic AP VP Percent: 74.43 %
Brady Statistic AP VS Percent: 19.78 %
Brady Statistic AS VP Percent: 0.78 %
Brady Statistic AS VS Percent: 5.02 %
Brady Statistic RA Percent Paced: 94.54 %
Brady Statistic RV Percent Paced: 75.2 %
Date Time Interrogation Session: 20210616005415
Implantable Lead Implant Date: 20181205
Implantable Lead Implant Date: 20181205
Implantable Lead Location: 753859
Implantable Lead Location: 753860
Implantable Lead Model: 5076
Implantable Lead Model: 5076
Implantable Pulse Generator Implant Date: 20181205
Lead Channel Impedance Value: 247 Ohm
Lead Channel Impedance Value: 285 Ohm
Lead Channel Impedance Value: 399 Ohm
Lead Channel Impedance Value: 418 Ohm
Lead Channel Pacing Threshold Amplitude: 0.625 V
Lead Channel Pacing Threshold Amplitude: 1.125 V
Lead Channel Pacing Threshold Pulse Width: 0.4 ms
Lead Channel Pacing Threshold Pulse Width: 0.4 ms
Lead Channel Sensing Intrinsic Amplitude: 1.25 mV
Lead Channel Sensing Intrinsic Amplitude: 1.25 mV
Lead Channel Sensing Intrinsic Amplitude: 6 mV
Lead Channel Sensing Intrinsic Amplitude: 6 mV
Lead Channel Setting Pacing Amplitude: 1.5 V
Lead Channel Setting Pacing Amplitude: 2.5 V
Lead Channel Setting Pacing Pulse Width: 0.4 ms
Lead Channel Setting Sensing Sensitivity: 0.9 mV

## 2020-06-10 NOTE — Progress Notes (Signed)
Remote pacemaker transmission.   

## 2020-06-14 ENCOUNTER — Other Ambulatory Visit: Payer: Self-pay | Admitting: *Deleted

## 2020-06-14 NOTE — Telephone Encounter (Signed)
Spoke to daughter a year supply was sent in December daughter is going to call pharmacy

## 2020-06-14 NOTE — Telephone Encounter (Signed)
Patient daughter called after hours line 06/13/2020. The daughter reports her father needs a refill of her father's Eliquis. He has enough for today.

## 2020-06-21 ENCOUNTER — Other Ambulatory Visit: Payer: Self-pay

## 2020-06-21 ENCOUNTER — Ambulatory Visit (INDEPENDENT_AMBULATORY_CARE_PROVIDER_SITE_OTHER): Payer: Medicare Other | Admitting: Cardiovascular Disease

## 2020-06-21 ENCOUNTER — Encounter: Payer: Self-pay | Admitting: Cardiovascular Disease

## 2020-06-21 VITALS — BP 162/80 | HR 88 | Ht 64.0 in | Wt 139.6 lb

## 2020-06-21 DIAGNOSIS — I472 Ventricular tachycardia: Secondary | ICD-10-CM

## 2020-06-21 DIAGNOSIS — I495 Sick sinus syndrome: Secondary | ICD-10-CM

## 2020-06-21 DIAGNOSIS — I10 Essential (primary) hypertension: Secondary | ICD-10-CM

## 2020-06-21 DIAGNOSIS — I4729 Other ventricular tachycardia: Secondary | ICD-10-CM

## 2020-06-21 DIAGNOSIS — I441 Atrioventricular block, second degree: Secondary | ICD-10-CM | POA: Diagnosis not present

## 2020-06-21 DIAGNOSIS — D649 Anemia, unspecified: Secondary | ICD-10-CM

## 2020-06-21 DIAGNOSIS — E7800 Pure hypercholesterolemia, unspecified: Secondary | ICD-10-CM

## 2020-06-21 DIAGNOSIS — I471 Supraventricular tachycardia: Secondary | ICD-10-CM

## 2020-06-21 DIAGNOSIS — Z950 Presence of cardiac pacemaker: Secondary | ICD-10-CM

## 2020-06-21 DIAGNOSIS — F0390 Unspecified dementia without behavioral disturbance: Secondary | ICD-10-CM

## 2020-06-21 NOTE — Progress Notes (Signed)
Cardiology Office Note:    Date:  06/21/2020   ID:  Joshua Burns, DOB [birth year], MRN [id number]  PCP:  Joshua Post, MD  Cardiologist:  Sanda Klein, MD    Referring MD: Joshua Post, MD   chief complaint: Arrhythmia follow-up  History of Present Illness:    Joshua Burns is a 86 y.o. male with a hx of syncope due to sinus pauses,  paroxysmal atrial fibrillation with rapid ventricular response s/p dual-chamber permanent pacemaker implantation in December 2018.  He has not had any syncope since pacemaker implantation.  He has never had stroke/TIA or other embolic events.    The patient specifically denies any chest pain at rest exertion, dyspnea at rest or with exertion, orthopnea, paroxysmal nocturnal dyspnea, syncope, palpitations, focal neurological deficits, intermittent claudication, lower extremity edema, unexplained weight gain, cough, hemoptysis or wheezing.  He has not had any falls, injuries or serious bleeding problems.  Pacemaker interrogation shows normal device function.  The underlying rhythm is severe sinus bradycardia in 30s.  He has almost 100% atrial pacing with appropriate heart rate histogram distribution.  He also has a high burden of ventricular pacing at 72%, but the ECG today shows pseudofusion, with native AV conduction at approximately 280 ms.  As before he has frequent episodes of paroxysmal atrial tachycardia that are generally quite brief and there has been no atrial fibrillation since device implantation.  He has had a couple of episodes of nonsustained ventricular tachycardia, longest 12 beats in duration, all asymptomatic.  He has treated hypertension and hyperlipidemia.  He has normal left ventricular systolic function and had a normal stress test in the remote past.    His memory problems are getting a little more obvious.  He is accompanied today by his son.  His blood pressure is a little high, but they both report that he always has  "whitecoat hypertension".  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/28/2017    Past Surgical History:  Procedure Laterality Date   ANTERIOR CERVICAL DECOMP/DISCECTOMY FUSION  02/2003   Archie Endo 05/08/2011   BRAIN SURGERY     CARDIOVASCULAR STRESS TEST  08-04-2008   EF 0%   CATARACT EXTRACTION W/ INTRAOCULAR LENS  IMPLANT, BILATERAL Bilateral    CORONARY ANGIOPLASTY  01/2003   Archie Endo 05/08/2011   INGUINAL HERNIA REPAIR Right 03/2004   Archie Endo 05/08/2011   INSERT / REPLACE / REMOVE PACEMAKER  11/28/2017   KNEE ARTHROSCOPY Left 09/2011   Archie Endo 09/26/2011   LOOP RECORDER INSERTION N/A 08/20/2017   Procedure: LOOP RECORDER INSERTION;  Surgeon: Sanda Klein, MD;  Location: [facility];  Service: Cardiovascular;  Laterality: N/A;   LOOP RECORDER REMOVAL  11/28/2017   LOOP RECORDER REMOVAL N/A 11/28/2017   Procedure: LOOP RECORDER REMOVAL;  Surgeon: Sanda Klein, MD;  Location: [facility];  Service: Cardiovascular;  Laterality: N/A;  PACEMAKER IMPLANT N/A 11/28/2017   Procedure: PACEMAKER IMPLANT - DUAL CHAMBER;  Surgeon: Sanda Klein, MD;  Location: [facility];  Service: Cardiovascular;  Laterality: N/A;   PROSTATE BIOPSY  03/2008   US ECHOCARDIOGRAPHY  08-04-2008   Est EF 55-60%    Current Medications: Current Meds  Medication Sig   donepezil (ARICEPT)  10 MG tablet TAKE 1 TABLET BY MOUTH EVERYDAY AT BEDTIME   ELIQUIS 2.5 MG TABS tablet TAKE 1 TABLET BY MOUTH TWICE A DAY   hydrochlorothiazide (HYDRODIURIL) 12.5 MG tablet Take 1 tablet (12.5 mg total) by mouth daily.   metoprolol tartrate (LOPRESSOR) 25 MG tablet Take 1 tablet (25 mg total) by mouth 2 (two) times daily. Must schedule overdue follow up appt for further refills. 1st attempt   Multiple Vitamin (MULTIVITAMIN WITH MINERALS) TABS tablet Take 1 tablet by mouth daily.   potassium chloride SA (KLOR-CON M20) 20 MEQ tablet Take 1 tablet (20 mEq total) by mouth daily. NEEDS OFFICE VISIT FOR FURTHER REFILLS  CALL [phone]   simvastatin (ZOCOR) 20 MG tablet Take one tablet by mouth everyday at night time.   [DISCONTINUED] amLODipine-benazepril (LOTREL) 5-20 MG capsule Take 1 capsule by mouth daily.     Allergies:   Ace inhibitors and Diltiazem   Social History   Socioeconomic History   Marital status: Widowed    Spouse name: Not on file   Number of children: 3   Years of education: Not on file   Highest education level: Not on file  Occupational History    Employer: [employer]  Tobacco Use   Smoking status: Former Smoker    Packs/day: 1.00    Years: 38.00    Pack years: 38.00    Types: Cigarettes    Quit date: 12/25/1978    Years since quitting: 41.5   Smokeless tobacco: Never Used  Vaping Use   Vaping Use: Never used  Substance and Sexual Activity   Alcohol use: No    Comment: does not    Drug use: No   Sexual activity: Not on file  Other Topics Concern   Not on file  Social History Narrative   Not on file   Social Determinants of Health   Financial Resource Strain:    Difficulty of Paying Living Expenses:   Food Insecurity:    Worried About Charity fundraiser in the Last Year:    Arboriculturist in the Last Year:   Transportation Needs:    Film/video editor (Medical):    Lack of Transportation (Non-Medical):   Physical  Activity:    Days of Exercise per Week:    Minutes of Exercise per Session:   Stress:    Feeling of Stress :   Social Connections:    Frequency of Communication with Friends and Family:    Frequency of Social Gatherings with Friends and Family:    Attends Religious Services:    Active Member of Clubs or Organizations:    Attends Archivist Meetings:    Marital Status:      Family History: The patient's family history includes Aneurysm in his brother; Heart disease in his father and mother. ROS:   Please see the history of present illness.    All other systems are reviewed and are negative  EKGs/Labs/Other Studies Reviewed:    EKG:  EKG is ordered today.  It shows atrial paced, ventricular sensed rhythm with ventricular pacing and pseudofusion.  The AV delay is  currently 276 ms.  The QRS is clearly native conduction, very narrow QRS.  Recent Labs: 04/27/2020: ALT 8; BUN 16; Creatinine, Ser 1.60; Hemoglobin 10.2; Platelets 86.0; Potassium 4.0; Sodium 140  Recent Lipid Panel    Component Value Date/Time   CHOL 132 04/27/2020 1131   TRIG 71.0 04/27/2020 1131   HDL 39.60 04/27/2020 1131   CHOLHDL 3 04/27/2020 1131   VLDL 14.2 04/27/2020 1131   LDLCALC 78 04/27/2020 1131    Physical Exam:    VS:  BP (!) 162/80    Pulse 88    Ht 5\' 4"  (1.626 m)    Wt 139 lb 9.6 oz (63.3 kg)    SpO2 99%    BMI 23.96 kg/m     Wt Readings from Last 3 Encounters:  06/21/20 139 lb 9.6 oz (63.3 kg)  04/27/20 138 lb (62.6 kg)  11/06/19 138 lb 11.2 oz (62.9 kg)      General: Alert, oriented x3, no distress, lean and appears very fit, younger than his stated age.  The left subclavian pacemaker site looks healthy. Head: no evidence of trauma, PERRL, EOMI, no exophtalmos or lid lag, no myxedema, no xanthelasma; normal ears, nose and oropharynx Neck: normal jugular venous pulsations and no hepatojugular reflux; brisk carotid pulses without delay and no carotid bruits Chest: clear to  auscultation, no signs of consolidation by percussion or palpation, normal fremitus, symmetrical and full respiratory excursions Cardiovascular: normal position and quality of the apical impulse, regular rhythm, normal first and second heart sounds, no murmurs, rubs or gallops Abdomen: no tenderness or distention, no masses by palpation, no abnormal pulsatility or arterial bruits, normal bowel sounds, no hepatosplenomegaly Extremities: no clubbing, cyanosis or edema; 2+ radial, ulnar and brachial pulses bilaterally; 2+ right femoral, posterior tibial and dorsalis pedis pulses; 2+ left femoral, posterior tibial and dorsalis pedis pulses; no subclavian or femoral bruits Neurological: grossly nonfocal Psych: Normal mood and affect   ASSESSMENT:    1. PAT (paroxysmal atrial tachycardia) ([place])   2. Tachycardia-bradycardia syndrome ([place])   3. Second degree Mobitz I AV block   4. Pacemaker   5. Dementia without behavioral disturbance, unspecified dementia type ([place])   6. NSVT (nonsustained ventricular tachycardia) ([facility])   7. Hypercholesterolemia   8. Essential hypertension   9. Anemia, unspecified type    PLAN:    In order of problems listed above:  1. PAT: Atrial fibrillation was suspected based on implantable loop recorder tracings, but since pacemaker implantation we have not seen true atrial fibrillation.  It is possible that he had periods of paroxysmal atrial tachycardia with variable AV block that mimicked atrial fibrillation.  He is on anticoagulation without bleeding complications. CHADSVasc 3 (age 48, HTN).  I would have a low threshold for discontinuing his anticoagulation if he has any bleeding complications. 2. SSS/Tachy-brady: No syncope since pacemaker implantation.  He has almost 100% atrial pacing but his heart rate histograms show appropriate rate response sensor settings. 3. 2nd deg AVB: His burden of ventricular pacing has increased again, but after increasing the paced AV delay  to 300 ms (with extension to 400 ms) he had native AV conduction, at least while I was monitoring him during the office. 4. PPM: Normal device function.  Only change today was extending the AV delay.  Programmed MVP (R). 5. Dementia: Slow worsening is apparent. 6. NSVT: On beta-blocker.  The episodes remain brief and asymptomatic.  No additional treatment recommended. 7. HLP: Recent lipid profile shows total parameters within  acceptable range.  He does not have known CAD or PAD. 8. HTN: Blood pressure is high in the office, but reportedly systolic blood pressure consistently in the 130s at home.  No changes made to his medications. 9. Eliquis: No falls or bleeding complications. 10. Anemia: Stable, mild.  Hemoglobin around 10 without microcytosis.  Also has moderate thrombocytopenia suggesting a bone marrow production disorder.  Patient Instructions  Medication Instructions:  No changes *If you need a refill on your cardiac medications before your next appointment, please call your pharmacy*   Lab Work: None ordered If you have labs (blood work) drawn today and your tests are completely normal, you will receive your results only by:  [place] (if you have MyChart) OR  A paper copy in the mail If you have any lab test that is abnormal or we need to change your treatment, we will call you to review the results.   Testing/Procedures: None ordered   Follow-Up: At [facility], you and your health needs are our priority.  As part of our continuing mission to provide you with exceptional heart care, we have created designated Provider Care Teams.  These Care Teams include your primary Cardiologist (physician) and Advanced Practice Providers (APPs -  Physician Assistants and Nurse Practitioners) who all work together to provide you with the care you need, when you need it.  We recommend signing up for the patient portal called "MyChart".  Sign up information is provided on this  After Visit Summary.  MyChart is used to connect with patients for Virtual Visits (Telemedicine).  Patients are able to view lab/test results, encounter notes, upcoming appointments, etc.  Non-urgent messages can be sent to your provider as well.   To learn more about what you can do with MyChart, go to [email or web address].    Your next appointment:   12 month(s)  The format for your next appointment:   In Person  Provider:   Sanda Klein, MD      Medication Adjustments/Labs and Tests Ordered: Current medicines are reviewed at length with the patient today.  Concerns regarding medicines are outlined above.  Orders Placed This Encounter  Procedures   EKG 12-Lead   No orders of the defined types were placed in this encounter.   Signed, Sanda Klein, MD  06/21/2020 4:01 PM    [facility]

## 2020-06-21 NOTE — Patient Instructions (Signed)

## 2020-07-14 ENCOUNTER — Ambulatory Visit: Payer: Medicare Other

## 2020-07-20 ENCOUNTER — Ambulatory Visit (INDEPENDENT_AMBULATORY_CARE_PROVIDER_SITE_OTHER): Payer: Medicare Other

## 2020-07-20 ENCOUNTER — Other Ambulatory Visit: Payer: Self-pay

## 2020-07-20 DIAGNOSIS — Z0000 Encounter for general adult medical examination without abnormal findings: Secondary | ICD-10-CM

## 2020-07-20 NOTE — Progress Notes (Signed)
Subjective:   Joshua Burns is a 86 y.o. male who presents for Medicare Annual/Subsequent preventive examination. I connected with [name]  today by telephone and verified that I am speaking with the correct person using two identifiers. Location patient: home Location provider: work Persons participating in the virtual visit: patient, provider.   I discussed the limitations, risks, security and privacy concerns of performing an evaluation and management service by telephone and the availability of in person appointments. I also discussed with the patient that there may be a patient responsible charge related to this service. The patient expressed understanding and verbally consented to this telephonic visit.    Interactive audio and video telecommunications were attempted between this provider and patient, however failed, due to patient having technical difficulties OR patient did not have access to video capability.  We continued and completed visit with audio only.      Review of Systems    N/A Cardiac Risk Factors include: advanced age (>87men, >1 women);hypertension;dyslipidemia     Objective:    Today's Vitals   There is no height or weight on file to calculate BMI.  Advanced Directives 07/20/2020 12/26/2017 11/28/2017 11/28/2017 08/18/2017 01/27/2017 09/02/2015  Does Patient Have a Medical Advance Directive? Yes Yes Yes Yes No No No  Type of Paramedic of [place];Living will - [place];Living will [place];Living will - - -  Does patient want to make changes to medical advance directive? No - Patient declined - - No - Patient declined - - -  Copy of [place] in Chart? Yes - validated most recent copy scanned in chart (See row information) - No - copy requested No - copy requested - - -  Would patient like information on creating a medical advance directive? - - - - No - Patient declined - No -  patient declined information    Current Medications (verified) Outpatient Encounter Medications as of 07/20/2020  Medication Sig  . donepezil (ARICEPT) 10 MG tablet TAKE 1 TABLET BY MOUTH EVERYDAY AT BEDTIME  . ELIQUIS 2.5 MG TABS tablet TAKE 1 TABLET BY MOUTH TWICE A DAY  . hydrochlorothiazide (HYDRODIURIL) 12.5 MG tablet Take 1 tablet (12.5 mg total) by mouth daily.  . metoprolol tartrate (LOPRESSOR) 25 MG tablet Take 1 tablet (25 mg total) by mouth 2 (two) times daily. Must schedule overdue follow up appt for further refills. 1st attempt  . Multiple Vitamin (MULTIVITAMIN WITH MINERALS) TABS tablet Take 1 tablet by mouth daily.  . potassium chloride SA (KLOR-CON M20) 20 MEQ tablet Take 1 tablet (20 mEq total) by mouth daily. NEEDS OFFICE VISIT FOR FURTHER REFILLS  CALL [phone]  . simvastatin (ZOCOR) 20 MG tablet Take one tablet by mouth everyday at night time.   No facility-administered encounter medications on file as of 07/20/2020.    Allergies (verified) Ace inhibitors and Diltiazem   History: Past Medical History:  Diagnosis Date  . Alzheimer disease ([place])   . Arthritis    "knees" (11/28/2017)  . BPH (benign prostatic hyperplasia) 01/28/2010   Qualifier: Diagnosis of  By: Joyce Gross    . CAD (coronary artery disease)   . Cognitive impairment 12/05/2016  . Glaucoma, both eyes   . HYPERLIPIDEMIA 01/28/2010  . HYPERTENSION 01/28/2010  . HYPERTROPHY PROSTATE W/UR OBST & OTH LUTS 01/28/2010  . Paroxysmal atrial fibrillation ([place]) 11/02/2017  . Presence of permanent cardiac pacemaker 11/28/2017  . Second degree Mobitz I AV block   .  Sick sinus syndrome ([place]) 08/18/2017  . Syncope   . 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 ANTERIOR CERVICAL DECOMP/DISCECTOMY FUSION  02/2003   Archie Endo 05/08/2011  . BRAIN SURGERY    . CARDIOVASCULAR STRESS TEST  08-04-2008   EF 0%  . CATARACT EXTRACTION W/ INTRAOCULAR LENS   IMPLANT, BILATERAL Bilateral   . CORONARY ANGIOPLASTY  01/2003   Archie Endo 05/08/2011  . INGUINAL HERNIA REPAIR Right 03/2004   Archie Endo 05/08/2011  . INSERT / REPLACE / REMOVE PACEMAKER  11/28/2017  . KNEE ARTHROSCOPY Left 09/2011   Archie Endo 09/26/2011  . LOOP RECORDER INSERTION N/A 08/20/2017   Procedure: LOOP RECORDER INSERTION;  Surgeon: Sanda Klein, MD;  Location: [facility];  Service: Cardiovascular;  Laterality: N/A;  . LOOP RECORDER REMOVAL  11/28/2017  . LOOP RECORDER REMOVAL N/A 11/28/2017   Procedure: LOOP RECORDER REMOVAL;  Surgeon: Sanda Klein, MD;  Location: [facility];  Service: Cardiovascular;  Laterality: N/A;  . PACEMAKER IMPLANT N/A 11/28/2017   Procedure: PACEMAKER IMPLANT - DUAL CHAMBER;  Surgeon: Sanda Klein, MD;  Location: [facility];  Service: Cardiovascular;  Laterality: N/A;  . PROSTATE BIOPSY  03/2008  . US ECHOCARDIOGRAPHY  08-04-2008   Est EF 55-60%   Family History  Problem Relation Age of Onset  . Heart disease Mother   . Heart disease Father   . Aneurysm Brother    Social History   Socioeconomic History  . Marital status: Widowed    Spouse name: Not on file  . Number of children: 3  . Years of education: Not on file  . Highest education level: Not on file  Occupational History    Employer: [employer]  Tobacco Use  . Smoking status: Former Smoker    Packs/day: 1.00    Years: 38.00    Pack years: 38.00    Types: Cigarettes    Quit date: 12/25/1978    Years since quitting: 41.5  . Smokeless tobacco: Never Used  Vaping Use  . Vaping Use: Never used  Substance and Sexual Activity  . Alcohol use: No    Comment: does not   . Drug use: No  . Sexual activity: Not on file  Other Topics Concern  . Not on file  Social History Narrative  . Not on file   Social Determinants of Health   Financial Resource Strain: Low Risk   . Difficulty of Paying Living Expenses: Not hard at all  Food Insecurity: No Food Insecurity    . Worried About Charity fundraiser in the Last Year: Never true  . Ran Out of Food in the Last Year: Never true  Transportation Needs: No Transportation Needs  . Lack of Transportation (Medical): No  . Lack of Transportation (Non-Medical): No  Physical Activity: Insufficiently Active  . Days of Exercise per Week: 7 days  . Minutes of Exercise per Session: 20 min  Stress: No Stress Concern Present  . Feeling of Stress : Not at all  Social Connections: Moderately Isolated  . Frequency of Communication with Friends and Family: More than three times a week  . Frequency of Social Gatherings with Friends and Family: More than three times a week  . Attends Religious Services: 1 to 4 times per year  . Active Member of Clubs or Organizations: No  . Attends Archivist Meetings: Never  . Marital Status: Widowed    Tobacco Counseling Counseling given:  Not Answered   Clinical Intake:  Pre-visit preparation completed: Yes  Pain : No/denies pain     Nutritional Risks: None Diabetes: No  How often do you need to have someone help you when you read instructions, pamphlets, or other written materials from your doctor or pharmacy?: 1 - Never What is the last grade level you completed in school?: High School  Diabetic?No  Interpreter Needed?: No  Information entered by :: [place] of Daily Living In your present state of health, do you have any difficulty performing the following activities: 07/20/2020  Hearing? N  Vision? N  Difficulty concentrating or making decisions? N  Walking or climbing stairs? N  Dressing or bathing? N  Doing errands, shopping? Y  Preparing Food and eating ? N  Using the Toilet? N  In the past six months, have you accidently leaked urine? N  Do you have problems with loss of bowel control? N  Managing your Medications? N  Managing your Finances? N  Housekeeping or managing your Housekeeping? N  Some recent data might be hidden     Patient Care Team: Eulas Post, MD as PCP - General Martinique, Peter M, MD (Cardiology)  Indicate any recent Medical Services you may have received from other than [facility] providers in the past year (date may be approximate).     Assessment:   This is a routine wellness examination for Samel.  Hearing/Vision screen  Hearing Screening   125Hz  250Hz  500Hz  1000Hz  2000Hz  3000Hz  4000Hz  6000Hz  8000Hz   Right ear:           Left ear:           Vision Screening Comments: Gets eyes examined every 2 years ago.   Dietary issues and exercise activities discussed: Current Exercise Habits: Home exercise routine, Type of exercise: Other - see comments (Stationary Bike), Time (Minutes): 15, Frequency (Times/Week): 7, Weekly Exercise (Minutes/Week): 105, Intensity: Mild  Goals    . Patient Stated     Stay active and keep playing cards       Depression Screen PHQ 2/9 Scores 07/20/2020 12/26/2017 10/31/2016 07/15/2015 01/14/2015 01/14/2015 11/06/2013  PHQ - 2 Score 0 0 0 0 0 0 0  PHQ- 9 Score 0 - - - - - -    Fall Risk Fall Risk  07/20/2020 04/27/2020 11/18/2019 12/26/2017 11/29/2017  Falls in the past year? 0 0 0 Yes No  Comment - - Emmi Telephone Survey: data to providers prior to load - Emmi Telephone Survey: data to providers prior to load  Number falls in past yr: 0 0 - 1 -  Injury with Fall? 0 - - Yes -  Comment - - - Head injury -  Risk for fall due to : Medication side effect;No Fall Risks - - Other (Comment) -  Risk for fall due to: Comment - - - needed a pace maker -  Follow up Falls evaluation completed;Falls prevention discussed Education provided - - -    Any stairs in or around the home? No  If so, are there any without handrails? No  Home free of loose throw rugs in walkways, pet beds, electrical cords, etc? Yes  Adequate lighting in your home to reduce risk of falls? Yes   ASSISTIVE DEVICES UTILIZED TO PREVENT FALLS:  Life alert? No  Use of a cane, walker or w/c? No  Grab  bars in the bathroom? No  Shower chair or bench in shower? No  Elevated toilet seat or  a handicapped toilet? No     Cognitive Function: MMSE - Mini Mental State Exam 12/26/2017  Orientation to time 2  Orientation to time comments missed day, year and date  Orientation to Place 3  Orientation to Place-comments could not think of Dr.  Elease Hashimoto name, did not know county  Registration 3  Attention/ Calculation 3  Recall 0  Language- name 2 objects 2  Language- repeat 1  Language- follow 3 step command 3  Language- read & follow direction 1  Write a sentence 1  Copy design 1  Total score 20     6CIT Screen 07/20/2020  What Year? 0 points  What month? 0 points  What time? 0 points  Count back from 20 0 points  Months in reverse 2 points  Repeat phrase 10 points  Total Score 12    Immunizations Immunization History  Administered Date(s) Administered  . Fluad Quad(high Dose 65+) 09/30/2019  . Influenza Split 09/19/2012  . Influenza, High Dose Seasonal PF 09/20/2018  . Influenza, Seasonal, Injecte, Preservative Fre 09/29/2013, 09/30/2014  . Influenza,inj,Quad PF,6+ Mos 09/09/2015  . Influenza-Unspecified 09/17/2016, 10/01/2017  . Pneumococcal Conjugate-13 12/05/2016  . Pneumococcal Polysaccharide-23 06/13/2012  . Tdap 08/18/2017  . Zoster 07/15/2012    TDAP status: Up to date Flu Vaccine status: Up to date Pneumococcal vaccine status: Up to date Covid-19 vaccine status: Declined, Education has been provided regarding the importance of this vaccine but patient still declined. Advised may receive this vaccine at local pharmacy or Health Dept.or vaccine clinic. Aware to provide a copy of the vaccination record if obtained from local pharmacy or Health Dept. Verbalized acceptance and understanding.  Qualifies for Shingles Vaccine? No   Zostavax completed Yes   Shingrix Completed?: No.    Education has been provided regarding the importance of this vaccine. Patient has been  advised to call insurance company to determine out of pocket expense if they have not yet received this vaccine. Advised may also receive vaccine at local pharmacy or Health Dept. Verbalized acceptance and understanding.  Screening Tests Health Maintenance  Topic Date Due  . COVID-19 Vaccine (1) Never done  . INFLUENZA VACCINE  07/25/2020  . TETANUS/TDAP  08/19/2027  . PNA vac Low Risk Adult  Completed    Health Maintenance  Health Maintenance Due  Topic Date Due  . COVID-19 Vaccine (1) Never done    Colorectal cancer screening: No longer required.   Lung Cancer Screening: (Low Dose CT Chest recommended if Age 72-80 years, 30 pack-year currently smoking OR have quit w/in 15years.) does not qualify.   Lung Cancer Screening Referral: N/A  Additional Screening:  Hepatitis C Screening: does not qualify;   Vision Screening: Recommended annual ophthalmology exams for early detection of glaucoma and other disorders of the eye. Is the patient up to date with their annual eye exam?  Yes  Who is the provider or what is the name of the office in which the patient attends annual eye exams? Patient unable to remember eye doctors name  If pt is not established with a provider, would they like to be referred to a provider to establish care? No .   Dental Screening: Recommended annual dental exams for proper oral hygiene  Community Resource Referral / Chronic Care Management: CRR required this visit?  No   CCM required this visit?  No      Plan:     I have personally reviewed and noted the following in the patient's chart:   .  Medical and social history . Use of alcohol, tobacco or illicit drugs  . Current medications and supplements . Functional ability and status . Nutritional status . Physical activity . Advanced directives . List of other physicians . Hospitalizations, surgeries, and ER visits in previous 12 months . Vitals . Screenings to include cognitive, depression,  and falls . Referrals and appointments  In addition, I have reviewed and discussed with patient certain preventive protocols, quality metrics, and best practice recommendations. A written personalized care plan for preventive services as well as general preventive health recommendations were provided to patient.     Ofilia Neas, LPN   06/23/1600   Nurse Notes: Patient had some issues with Short term memory on cognitive screening with address recall.

## 2020-07-20 NOTE — Patient Instructions (Signed)
[name] , Thank you for taking time to come for your Medicare Wellness Visit. I appreciate your ongoing commitment to your health goals. Please review the following plan we discussed and let me know if I can assist you in the future.   Screening recommendations/referrals: Colonoscopy: No longer required Recommended yearly ophthalmology/optometry visit for glaucoma screening and checkup Recommended yearly dental visit for hygiene and checkup  Vaccinations: Influenza vaccine: Up to date, next due 07/2020 Pneumococcal vaccine: Completed series  Tdap vaccine: up to date, next due 08/19/2027 Shingles vaccine: Currently due, please contact your pharmacy to discuss cost and to receive     Advanced directives: Copies on file   Conditions/risks identified: None   Next appointment: 11/08/2020 @ 9:30 am with Dr. Elease Hashimoto  Preventive Care 65 Years and Older, Male Preventive care refers to lifestyle choices and visits with your health care provider that can promote health and wellness. What does preventive care include?  A yearly physical exam. This is also called an annual well check.  Dental exams once or twice a year.  Routine eye exams. Ask your health care provider how often you should have your eyes checked.  Personal lifestyle choices, including:  Daily care of your teeth and gums.  Regular physical activity.  Eating a healthy diet.  Avoiding tobacco and drug use.  Limiting alcohol use.  Practicing safe sex.  Taking low doses of aspirin every day.  Taking vitamin and mineral supplements as recommended by your health care provider. What happens during an annual well check? The services and screenings done by your health care provider during your annual well check will depend on your age, overall health, lifestyle risk factors, and family history of disease. Counseling  Your health care provider may ask you questions about your:  Alcohol use.  Tobacco use.  Drug  use.  Emotional well-being.  Home and relationship well-being.  Sexual activity.  Eating habits.  History of falls.  Memory and ability to understand (cognition).  Work and work Statistician. Screening  You may have the following tests or measurements:  Height, weight, and BMI.  Blood pressure.  Lipid and cholesterol levels. These may be checked every 5 years, or more frequently if you are over 43 years old.  Skin check.  Lung cancer screening. You may have this screening every year starting at age 79 if you have a 30-pack-year history of smoking and currently smoke or have quit within the past 15 years.  Fecal occult blood test (FOBT) of the stool. You may have this test every year starting at age 46.  Flexible sigmoidoscopy or colonoscopy. You may have a sigmoidoscopy every 5 years or a colonoscopy every 10 years starting at age 26.  Prostate cancer screening. Recommendations will vary depending on your family history and other risks.  Hepatitis C blood test.  Hepatitis B blood test.  Sexually transmitted disease (STD) testing.  Diabetes screening. This is done by checking your blood sugar (glucose) after you have not eaten for a while (fasting). You may have this done every 1-3 years.  Abdominal aortic aneurysm (AAA) screening. You may need this if you are a current or former smoker.  Osteoporosis. You may be screened starting at age 84 if you are at high risk. Talk with your health care provider about your test results, treatment options, and if necessary, the need for more tests. Vaccines  Your health care provider may recommend certain vaccines, such as:  Influenza vaccine. This is recommended every year.  Tetanus, diphtheria, and acellular pertussis (Tdap, Td) vaccine. You may need a Td booster every 10 years.  Zoster vaccine. You may need this after age 11.  Pneumococcal 13-valent conjugate (PCV13) vaccine. One dose is recommended after age  6.  Pneumococcal polysaccharide (PPSV23) vaccine. One dose is recommended after age 69. Talk to your health care provider about which screenings and vaccines you need and how often you need them. This information is not intended to replace advice given to you by your health care provider. Make sure you discuss any questions you have with your health care provider. Document Released: 01/07/2016 Document Revised: 08/30/2016 Document Reviewed: 10/12/2015 Elsevier Interactive Patient Education  2017 [place] Prevention in the Home Falls can cause injuries. They can happen to people of all ages. There are many things you can do to make your home safe and to help prevent falls. What can I do on the outside of my home?  Regularly fix the edges of walkways and driveways and fix any cracks.  Remove anything that might make you trip as you walk through a door, such as a raised step or threshold.  Trim any bushes or trees on the path to your home.  Use bright outdoor lighting.  Clear any walking paths of anything that might make someone trip, such as rocks or tools.  Regularly check to see if handrails are loose or broken. Make sure that both sides of any steps have handrails.  Any raised decks and porches should have guardrails on the edges.  Have any leaves, snow, or ice cleared regularly.  Use sand or salt on walking paths during winter.  Clean up any spills in your garage right away. This includes oil or grease spills. What can I do in the bathroom?  Use night lights.  Install grab bars by the toilet and in the tub and shower. Do not use towel bars as grab bars.  Use non-skid mats or decals in the tub or shower.  If you need to sit down in the shower, use a plastic, non-slip stool.  Keep the floor dry. Clean up any water that spills on the floor as soon as it happens.  Remove soap buildup in the tub or shower regularly.  Attach bath mats securely with double-sided  non-slip rug tape.  Do not have throw rugs and other things on the floor that can make you trip. What can I do in the bedroom?  Use night lights.  Make sure that you have a light by your bed that is easy to reach.  Do not use any sheets or blankets that are too big for your bed. They should not hang down onto the floor.  Have a firm chair that has side arms. You can use this for support while you get dressed.  Do not have throw rugs and other things on the floor that can make you trip. What can I do in the kitchen?  Clean up any spills right away.  Avoid walking on wet floors.  Keep items that you use a lot in easy-to-reach places.  If you need to reach something above you, use a strong step stool that has a grab bar.  Keep electrical cords out of the way.  Do not use floor polish or wax that makes floors slippery. If you must use wax, use non-skid floor wax.  Do not have throw rugs and other things on the floor that can make you trip. What can I do  with my stairs?  Do not leave any items on the stairs.  Make sure that there are handrails on both sides of the stairs and use them. Fix handrails that are broken or loose. Make sure that handrails are as long as the stairways.  Check any carpeting to make sure that it is firmly attached to the stairs. Fix any carpet that is loose or worn.  Avoid having throw rugs at the top or bottom of the stairs. If you do have throw rugs, attach them to the floor with carpet tape.  Make sure that you have a light switch at the top of the stairs and the bottom of the stairs. If you do not have them, ask someone to add them for you. What else can I do to help prevent falls?  Wear shoes that:  Do not have high heels.  Have rubber bottoms.  Are comfortable and fit you well.  Are closed at the toe. Do not wear sandals.  If you use a stepladder:  Make sure that it is fully opened. Do not climb a closed stepladder.  Make sure that both  sides of the stepladder are locked into place.  Ask someone to hold it for you, if possible.  Clearly mark and make sure that you can see:  Any grab bars or handrails.  First and last steps.  Where the edge of each step is.  Use tools that help you move around (mobility aids) if they are needed. These include:  Canes.  Walkers.  Scooters.  Crutches.  Turn on the lights when you go into a dark area. Replace any light bulbs as soon as they burn out.  Set up your furniture so you have a clear path. Avoid moving your furniture around.  If any of your floors are uneven, fix them.  If there are any pets around you, be aware of where they are.  Review your medicines with your doctor. Some medicines can make you feel dizzy. This can increase your chance of falling. Ask your doctor what other things that you can do to help prevent falls. This information is not intended to replace advice given to you by your health care provider. Make sure you discuss any questions you have with your health care provider. Document Released: 10/07/2009 Document Revised: 05/18/2016 Document Reviewed: 01/15/2015 Elsevier Interactive Patient Education  2017 [employer].

## 2020-08-22 ENCOUNTER — Other Ambulatory Visit: Payer: Self-pay | Admitting: Cardiovascular Disease

## 2020-08-30 ENCOUNTER — Other Ambulatory Visit: Payer: Self-pay | Admitting: Cardiovascular Disease

## 2020-09-08 ENCOUNTER — Ambulatory Visit (INDEPENDENT_AMBULATORY_CARE_PROVIDER_SITE_OTHER): Payer: Medicare Other | Admitting: *Deleted

## 2020-09-08 DIAGNOSIS — R55 Syncope and collapse: Secondary | ICD-10-CM

## 2020-09-08 LAB — CUP PACEART REMOTE DEVICE CHECK
Battery Remaining Longevity: 104 mo
Battery Voltage: 2.99 V
Brady Statistic AP VP Percent: 70.2 %
Brady Statistic AP VS Percent: 22.15 %
Brady Statistic AS VP Percent: 1.83 %
Brady Statistic AS VS Percent: 5.82 %
Brady Statistic RA Percent Paced: 92.73 %
Brady Statistic RV Percent Paced: 72.04 %
Date Time Interrogation Session: 20210915005635
Implantable Lead Implant Date: 20181205
Implantable Lead Implant Date: 20181205
Implantable Lead Location: 753859
Implantable Lead Location: 753860
Implantable Lead Model: 5076
Implantable Lead Model: 5076
Implantable Pulse Generator Implant Date: 20181205
Lead Channel Impedance Value: 266 Ohm
Lead Channel Impedance Value: 285 Ohm
Lead Channel Impedance Value: 418 Ohm
Lead Channel Impedance Value: 418 Ohm
Lead Channel Pacing Threshold Amplitude: 0.625 V
Lead Channel Pacing Threshold Amplitude: 1.125 V
Lead Channel Pacing Threshold Pulse Width: 0.4 ms
Lead Channel Pacing Threshold Pulse Width: 0.4 ms
Lead Channel Sensing Intrinsic Amplitude: 1.25 mV
Lead Channel Sensing Intrinsic Amplitude: 1.25 mV
Lead Channel Sensing Intrinsic Amplitude: 5.75 mV
Lead Channel Sensing Intrinsic Amplitude: 5.75 mV
Lead Channel Setting Pacing Amplitude: 1.5 V
Lead Channel Setting Pacing Amplitude: 2.5 V
Lead Channel Setting Pacing Pulse Width: 0.4 ms
Lead Channel Setting Sensing Sensitivity: 0.9 mV

## 2020-09-09 NOTE — Progress Notes (Signed)
Remote pacemaker transmission.   

## 2020-09-10 MED ORDER — SIMVASTATIN 20 MG PO TABS: ORAL_TABLET | ORAL | 3 refills | Status: DC

## 2020-10-14 ENCOUNTER — Other Ambulatory Visit: Payer: Self-pay | Admitting: Cardiovascular Disease

## 2020-10-29 ENCOUNTER — Ambulatory Visit: Payer: Medicare Other | Admitting: Family Medicine

## 2020-10-30 ENCOUNTER — Other Ambulatory Visit: Payer: Self-pay | Admitting: Family Medicine

## 2020-11-01 NOTE — Telephone Encounter (Signed)
Refill for 3 months. 

## 2020-11-14 ENCOUNTER — Other Ambulatory Visit: Payer: Self-pay | Admitting: Adult Health

## 2020-11-14 ENCOUNTER — Other Ambulatory Visit: Payer: Self-pay | Admitting: Family Medicine

## 2020-11-16 ENCOUNTER — Telehealth: Payer: Self-pay | Admitting: Family Medicine

## 2020-11-16 NOTE — Telephone Encounter (Signed)
Left a detailed message for pt daughter explaining that pharmacy is filling pt prescription

## 2020-11-16 NOTE — Telephone Encounter (Signed)
Spoke with pt pharmacy state that pt has enough refills and that they would notify pt when  refill is ready for pick up

## 2020-11-16 NOTE — Telephone Encounter (Signed)
Patient daughter is calling and requesting a refill for KLOR-CON M20 20 MEQ tablet sent to [id number] [address], [address] [place] [address]  Phone:  [phone] Fax:  [phone]

## 2020-12-08 ENCOUNTER — Ambulatory Visit (INDEPENDENT_AMBULATORY_CARE_PROVIDER_SITE_OTHER): Payer: Medicare Other

## 2020-12-08 DIAGNOSIS — I495 Sick sinus syndrome: Secondary | ICD-10-CM | POA: Diagnosis not present

## 2020-12-08 LAB — CUP PACEART REMOTE DEVICE CHECK
Battery Remaining Longevity: 99 mo
Battery Voltage: 2.99 V
Brady Statistic AP VP Percent: 74.03 %
Brady Statistic AP VS Percent: 20.01 %
Brady Statistic AS VP Percent: 1.69 %
Brady Statistic AS VS Percent: 4.27 %
Brady Statistic RA Percent Paced: 94.28 %
Brady Statistic RV Percent Paced: 75.72 %
Date Time Interrogation Session: 20211214235628
Implantable Lead Implant Date: 20181205
Implantable Lead Implant Date: 20181205
Implantable Lead Location: 753859
Implantable Lead Location: 753860
Implantable Lead Model: 5076
Implantable Lead Model: 5076
Implantable Pulse Generator Implant Date: 20181205
Lead Channel Impedance Value: 247 Ohm
Lead Channel Impedance Value: 266 Ohm
Lead Channel Impedance Value: 380 Ohm
Lead Channel Impedance Value: 418 Ohm
Lead Channel Pacing Threshold Amplitude: 0.625 V
Lead Channel Pacing Threshold Amplitude: 1.125 V
Lead Channel Pacing Threshold Pulse Width: 0.4 ms
Lead Channel Pacing Threshold Pulse Width: 0.4 ms
Lead Channel Sensing Intrinsic Amplitude: 1.375 mV
Lead Channel Sensing Intrinsic Amplitude: 1.375 mV
Lead Channel Sensing Intrinsic Amplitude: 6 mV
Lead Channel Sensing Intrinsic Amplitude: 6 mV
Lead Channel Setting Pacing Amplitude: 1.5 V
Lead Channel Setting Pacing Amplitude: 2.5 V
Lead Channel Setting Pacing Pulse Width: 0.4 ms
Lead Channel Setting Sensing Sensitivity: 0.9 mV

## 2020-12-09 DIAGNOSIS — Z23 Encounter for immunization: Secondary | ICD-10-CM | POA: Diagnosis not present

## 2020-12-15 ENCOUNTER — Other Ambulatory Visit: Payer: Self-pay | Admitting: Family Medicine

## 2020-12-22 NOTE — Progress Notes (Signed)
Remote pacemaker transmission.   

## 2021-02-05 ENCOUNTER — Other Ambulatory Visit: Payer: Self-pay | Admitting: Family Medicine

## 2021-03-09 ENCOUNTER — Ambulatory Visit (INDEPENDENT_AMBULATORY_CARE_PROVIDER_SITE_OTHER): Payer: Medicare Other

## 2021-03-09 DIAGNOSIS — I495 Sick sinus syndrome: Secondary | ICD-10-CM

## 2021-03-10 LAB — CUP PACEART REMOTE DEVICE CHECK
Battery Remaining Longevity: 94 mo
Battery Voltage: 2.99 V
Brady Statistic AP VP Percent: 73.26 %
Brady Statistic AP VS Percent: 22.92 %
Brady Statistic AS VP Percent: 0.84 %
Brady Statistic AS VS Percent: 2.98 %
Brady Statistic RA Percent Paced: 96.2 %
Brady Statistic RV Percent Paced: 74.1 %
Date Time Interrogation Session: 20220316005637
Implantable Lead Implant Date: 20181205
Implantable Lead Implant Date: 20181205
Implantable Lead Location: 753859
Implantable Lead Location: 753860
Implantable Lead Model: 5076
Implantable Lead Model: 5076
Implantable Pulse Generator Implant Date: 20181205
Lead Channel Impedance Value: 266 Ohm
Lead Channel Impedance Value: 266 Ohm
Lead Channel Impedance Value: 361 Ohm
Lead Channel Impedance Value: 380 Ohm
Lead Channel Pacing Threshold Amplitude: 0.75 V
Lead Channel Pacing Threshold Amplitude: 1.125 V
Lead Channel Pacing Threshold Pulse Width: 0.4 ms
Lead Channel Pacing Threshold Pulse Width: 0.4 ms
Lead Channel Sensing Intrinsic Amplitude: 1.125 mV
Lead Channel Sensing Intrinsic Amplitude: 1.125 mV
Lead Channel Sensing Intrinsic Amplitude: 5.375 mV
Lead Channel Sensing Intrinsic Amplitude: 5.375 mV
Lead Channel Setting Pacing Amplitude: 1.5 V
Lead Channel Setting Pacing Amplitude: 2.5 V
Lead Channel Setting Pacing Pulse Width: 0.4 ms
Lead Channel Setting Sensing Sensitivity: 0.9 mV

## 2021-03-17 NOTE — Progress Notes (Signed)
Remote pacemaker transmission.   

## 2021-04-17 ENCOUNTER — Other Ambulatory Visit: Payer: Self-pay | Admitting: Cardiovascular Disease

## 2021-04-17 ENCOUNTER — Other Ambulatory Visit: Payer: Self-pay | Admitting: Family Medicine

## 2021-06-08 ENCOUNTER — Ambulatory Visit (INDEPENDENT_AMBULATORY_CARE_PROVIDER_SITE_OTHER): Payer: Medicare Other

## 2021-06-08 DIAGNOSIS — I495 Sick sinus syndrome: Secondary | ICD-10-CM | POA: Diagnosis not present

## 2021-06-09 LAB — CUP PACEART REMOTE DEVICE CHECK
Battery Remaining Longevity: 90 mo
Battery Voltage: 2.98 V
Brady Statistic AP VP Percent: 71.63 %
Brady Statistic AP VS Percent: 22.71 %
Brady Statistic AS VP Percent: 0.92 %
Brady Statistic AS VS Percent: 4.74 %
Brady Statistic RA Percent Paced: 94.27 %
Brady Statistic RV Percent Paced: 72.55 %
Date Time Interrogation Session: 20220615005444
Implantable Lead Implant Date: 20181205
Implantable Lead Implant Date: 20181205
Implantable Lead Location: 753859
Implantable Lead Location: 753860
Implantable Lead Model: 5076
Implantable Lead Model: 5076
Implantable Pulse Generator Implant Date: 20181205
Lead Channel Impedance Value: 247 Ohm
Lead Channel Impedance Value: 266 Ohm
Lead Channel Impedance Value: 342 Ohm
Lead Channel Impedance Value: 399 Ohm
Lead Channel Pacing Threshold Amplitude: 0.625 V
Lead Channel Pacing Threshold Amplitude: 1 V
Lead Channel Pacing Threshold Pulse Width: 0.4 ms
Lead Channel Pacing Threshold Pulse Width: 0.4 ms
Lead Channel Sensing Intrinsic Amplitude: 1 mV
Lead Channel Sensing Intrinsic Amplitude: 1 mV
Lead Channel Sensing Intrinsic Amplitude: 5.5 mV
Lead Channel Sensing Intrinsic Amplitude: 5.5 mV
Lead Channel Setting Pacing Amplitude: 1.5 V
Lead Channel Setting Pacing Amplitude: 2.5 V
Lead Channel Setting Pacing Pulse Width: 0.4 ms
Lead Channel Setting Sensing Sensitivity: 0.9 mV

## 2021-06-10 ENCOUNTER — Other Ambulatory Visit: Payer: Self-pay | Admitting: Family Medicine

## 2021-06-29 NOTE — Progress Notes (Signed)
Remote pacemaker transmission.   

## 2021-07-17 ENCOUNTER — Other Ambulatory Visit: Payer: Self-pay | Admitting: Cardiovascular Disease

## 2021-07-17 ENCOUNTER — Other Ambulatory Visit: Payer: Self-pay | Admitting: Family Medicine

## 2021-07-22 ENCOUNTER — Encounter: Payer: Self-pay | Admitting: Family Medicine

## 2021-07-22 ENCOUNTER — Ambulatory Visit (INDEPENDENT_AMBULATORY_CARE_PROVIDER_SITE_OTHER): Payer: Medicare Other | Admitting: Family Medicine

## 2021-07-22 ENCOUNTER — Other Ambulatory Visit: Payer: Self-pay

## 2021-07-22 ENCOUNTER — Ambulatory Visit (INDEPENDENT_AMBULATORY_CARE_PROVIDER_SITE_OTHER): Payer: Medicare Other

## 2021-07-22 VITALS — BP 138/80 | HR 90 | Temp 98.4°F | Ht 65.0 in | Wt 138.0 lb

## 2021-07-22 DIAGNOSIS — E7800 Pure hypercholesterolemia, unspecified: Secondary | ICD-10-CM

## 2021-07-22 DIAGNOSIS — Z0000 Encounter for general adult medical examination without abnormal findings: Secondary | ICD-10-CM

## 2021-07-22 DIAGNOSIS — Z79899 Other long term (current) drug therapy: Secondary | ICD-10-CM

## 2021-07-22 DIAGNOSIS — I10 Essential (primary) hypertension: Secondary | ICD-10-CM

## 2021-07-22 DIAGNOSIS — I495 Sick sinus syndrome: Secondary | ICD-10-CM

## 2021-07-22 LAB — BASIC METABOLIC PANEL
BUN: 18 mg/dL (ref 6–23)
CO2: 31 mEq/L (ref 19–32)
Calcium: 9.6 mg/dL (ref 8.4–10.5)
Chloride: 105 mEq/L (ref 96–112)
Creatinine, Ser: 1.74 mg/dL — ABNORMAL HIGH (ref 0.40–1.50)
GFR: 33.16 mL/min — ABNORMAL LOW (ref >60.00)
Glucose, Bld: 103 mg/dL — ABNORMAL HIGH (ref 70–99)
Potassium: 4.5 mEq/L (ref 3.5–5.1)
Sodium: 143 mEq/L (ref 135–145)

## 2021-07-22 LAB — HEPATIC FUNCTION PANEL
ALT: 7 U/L (ref 0–53)
AST: 13 U/L (ref 0–37)
Albumin: 4.6 g/dL (ref 3.5–5.2)
Alkaline Phosphatase: 59 U/L (ref 39–117)
Bilirubin, Direct: 0.2 mg/dL (ref 0.0–0.3)
Total Bilirubin: 0.7 mg/dL (ref 0.2–1.2)
Total Protein: 7.6 g/dL (ref 6.0–8.3)

## 2021-07-22 LAB — CBC WITH DIFFERENTIAL/PLATELET
Basophils Absolute: 0 10*3/uL (ref 0.0–0.1)
Basophils Relative: 0.7 % (ref 0.0–3.0)
Eosinophils Absolute: 0 10*3/uL (ref 0.0–0.7)
Eosinophils Relative: 1 % (ref 0.0–5.0)
HCT: 29.3 % — ABNORMAL LOW (ref 39.0–52.0)
Hemoglobin: 9.8 g/dL — ABNORMAL LOW (ref 13.0–17.0)
Lymphocytes Relative: 39.1 % (ref 12.0–46.0)
Lymphs Abs: 1.7 10*3/uL (ref 0.7–4.0)
MCHC: 33.4 g/dL (ref 30.0–36.0)
MCV: 88.3 fl (ref 78.0–100.0)
Monocytes Absolute: 0.5 10*3/uL (ref 0.1–1.0)
Monocytes Relative: 11.4 % (ref 3.0–12.0)
Neutro Abs: 2.1 10*3/uL (ref 1.4–7.7)
Neutrophils Relative %: 47.8 % (ref 43.0–77.0)
Platelets: 76 10*3/uL — ABNORMAL LOW (ref 150.0–400.0)
RBC: 3.32 Mil/uL — ABNORMAL LOW (ref 4.22–5.81)
RDW: 15.3 % (ref 11.5–15.5)
WBC: 4.5 10*3/uL (ref 4.0–10.5)

## 2021-07-22 LAB — LIPID PANEL
Cholesterol: 130 mg/dL (ref 0–200)
HDL: 38.9 mg/dL — ABNORMAL LOW (ref >39.00)
LDL Cholesterol: 69 mg/dL (ref 0–99)
NonHDL: 90.97
Total CHOL/HDL Ratio: 3
Triglycerides: 111 mg/dL (ref 0.0–149.0)
VLDL: 22.2 mg/dL (ref 0.0–40.0)

## 2021-07-22 NOTE — Addendum Note (Signed)
Addended by: Amanda Cockayne on: 07/22/2021 02:36 PM   Modules accepted: Orders

## 2021-07-22 NOTE — Progress Notes (Signed)
Established Patient Office Visit  Subjective:  Patient ID: Joshua Burns, male    DOB: 21-Jun-1925  Age: 87 y.o. MRN: [id number]  CC:  Chief Complaint  Patient presents with   Follow-up    HPI Joshua Burns presents for yearly medical follow-up.  He actually was here today with one of our nurses for Medicare wellness visit.  He has history of hypertension, CAD, A. fib, tachycardia-bradycardia syndrome, cognitive impairment, hyperlipidemia.  He has pacemaker.  Denies any recent dizziness, syncope, chest pains, or dyspnea.  His weight is unchanged from last year.  He states his appetite is stable.  Denies any recent falls.  Denies any depression symptoms. He has chronic normocytic anemia with baseline hemoglobin low 10 range.  Renal function was slightly worse last year with GFR around 49.  This needs to be repeated.  His daughter and son live with him and help with cooking and transportation needs.  His current medications include K-Lor 20 mill equivalents daily, HCTZ 12.5 mg daily, simvastatin 20 mg daily, Aricept 10 mg daily, metoprolol 25 mg twice daily, Eliquis 2.5 mg twice daily.  Last labs were over a year ago.  Past Medical History:  Diagnosis Date   Alzheimer disease (Joshua Burns)    Arthritis    "knees" (11/28/2017)   BPH (benign prostatic hyperplasia) 01/28/2010   Qualifier: Diagnosis of  By: Joshua Burns     CAD (coronary artery disease)    Cognitive impairment 12/05/2016   Glaucoma, both eyes    HYPERLIPIDEMIA 01/28/2010   HYPERTENSION 01/28/2010   HYPERTROPHY PROSTATE W/UR OBST & OTH LUTS 01/28/2010   Paroxysmal atrial fibrillation ([place]) 11/02/2017   Presence of permanent cardiac pacemaker 11/28/2017   Second degree Mobitz I AV block    Sick sinus syndrome (Joshua Burns) 08/18/2017   Syncope    Tachycardia-bradycardia syndrome ([place]) 11/28/2017    Past Surgical History:  Procedure Laterality Date   ANTERIOR CERVICAL DECOMP/DISCECTOMY FUSION  02/2003   Joshua Burns 05/08/2011   BRAIN SURGERY      CARDIOVASCULAR STRESS TEST  08-04-2008   EF 0%   CATARACT EXTRACTION W/ INTRAOCULAR LENS  IMPLANT, BILATERAL Bilateral    CORONARY ANGIOPLASTY  01/2003   Joshua Burns 05/08/2011   INGUINAL HERNIA REPAIR Right 03/2004   Joshua Burns 05/08/2011   INSERT / REPLACE / REMOVE PACEMAKER  11/28/2017   KNEE ARTHROSCOPY Left 09/2011   Joshua Burns 09/26/2011   LOOP RECORDER INSERTION N/A 08/20/2017   Procedure: LOOP RECORDER INSERTION;  Surgeon: Joshua Klein, MD;  Location: [facility];  Service: Cardiovascular;  Laterality: N/A;   LOOP RECORDER REMOVAL  11/28/2017   LOOP RECORDER REMOVAL N/A 11/28/2017   Procedure: LOOP RECORDER REMOVAL;  Surgeon: Joshua Klein, MD;  Location: [facility];  Service: Cardiovascular;  Laterality: N/A;   PACEMAKER IMPLANT N/A 11/28/2017   Procedure: PACEMAKER IMPLANT - DUAL CHAMBER;  Surgeon: Joshua Klein, MD;  Location: [facility];  Service: Cardiovascular;  Laterality: N/A;   PROSTATE BIOPSY  03/2008   US ECHOCARDIOGRAPHY  08-04-2008   Est EF 55-60%    Family History  Problem Relation Age of Onset   Heart disease Mother    Heart disease Father    Aneurysm Brother     Social History   Socioeconomic History   Marital status: Widowed    Spouse name: Not on file   Number of children: 3   Years of education: Not on file   Highest education level: Not on file  Occupational History  Employer: [employer]  Tobacco Use   Smoking status: Former    Packs/day: 1.00    Years: 38.00    Pack years: 38.00    Types: Cigarettes    Quit date: 12/25/1978    Years since quitting: 42.6   Smokeless tobacco: Never  Vaping Use   Vaping Use: Never used  Substance and Sexual Activity   Alcohol use: No    Comment: does not    Drug use: No   Sexual activity: Not on file  Other Topics Concern   Not on file  Social History Narrative   Not on file   Social Determinants of Health   Financial Resource Strain: Low Risk    Difficulty of Paying Living  Expenses: Not hard at all  Food Insecurity: No Food Insecurity   Worried About Charity fundraiser in the Last Year: Never true   Ran Out of Food in the Last Year: Never true  Transportation Needs: No Transportation Needs   Lack of Transportation (Medical): No   Lack of Transportation (Non-Medical): No  Physical Activity: Inactive   Days of Exercise per Week: 0 days   Minutes of Exercise per Session: 0 min  Stress: No Stress Concern Present   Feeling of Stress : Not at all  Social Connections: Socially Isolated   Frequency of Communication with Friends and Family: Twice a week   Frequency of Social Gatherings with Friends and Family: Twice a week   Attends Religious Services: Never   Marine scientist or Organizations: No   Attends Archivist Meetings: Never   Marital Status: Widowed  Human resources officer Violence: Not At Risk   Fear of Current or Ex-Partner: No   Emotionally Abused: No   Physically Abused: No   Sexually Abused: No    Outpatient Medications Prior to Visit  Medication Sig Dispense Refill   donepezil (ARICEPT) 10 MG tablet TAKE 1 TABLET BY MOUTH EVERYDAY AT BEDTIME 90 tablet 1   ELIQUIS 2.5 MG TABS tablet TAKE 1 TABLET BY MOUTH TWICE A DAY 180 tablet 3   hydrochlorothiazide (HYDRODIURIL) 12.5 MG tablet TAKE 1 TABLET BY MOUTH EVERY DAY 90 tablet 3   KLOR-CON M20 20 MEQ tablet TAKE 1 TABLET BY MOUTH DAILY. NEEDS OFFICE VISIT FOR FURTHER REFILLS CALL [phone] 90 tablet 0   metoprolol tartrate (LOPRESSOR) 25 MG tablet TAKE 1 TABLET BY MOUTH TWICE A DAY 180 tablet 0   Multiple Vitamin (MULTIVITAMIN WITH MINERALS) TABS tablet Take 1 tablet by mouth daily.     simvastatin (ZOCOR) 20 MG tablet TAKE 1 TABLET BY MOUTH EVERY DAY AT NIGHT TIME 90 tablet 3   No facility-administered medications prior to visit.    Allergies  Allergen Reactions   Ace Inhibitors Swelling   Diltiazem Swelling and Palpitations    ROS Review of Systems  Constitutional:   Negative for fatigue and unexpected weight change.  Eyes:  Negative for visual disturbance.  Respiratory:  Negative for cough, chest tightness and shortness of breath.   Cardiovascular:  Negative for chest pain, palpitations and leg swelling.  Gastrointestinal:  Negative for abdominal pain.  Endocrine: Negative for polydipsia and polyuria.  Genitourinary:  Negative for difficulty urinating and dysuria.  Neurological:  Negative for dizziness, syncope, weakness, light-headedness and headaches.     Objective:    Physical Exam Constitutional:      Appearance: He is well-developed.  HENT:     Right Ear: External ear normal.  Left Ear: External ear normal.  Eyes:     Pupils: Pupils are equal, round, and reactive to light.  Neck:     Thyroid: No thyromegaly.  Cardiovascular:     Rate and Rhythm: Normal rate and regular rhythm.  Pulmonary:     Effort: Pulmonary effort is normal. No respiratory distress.     Breath sounds: Normal breath sounds. No wheezing or rales.  Musculoskeletal:     Cervical back: Neck supple.  Neurological:     General: No focal deficit present.     Mental Status: He is alert.    There were no vitals taken for this visit. Wt Readings from Last 3 Encounters:  07/22/21 138 lb (62.6 kg)  06/21/20 139 lb 9.6 oz (63.3 kg)  04/27/20 138 lb (62.6 kg)     Health Maintenance Due  Topic Date Due   Zoster Vaccines- Shingrix (1 of 2) Never done   COVID-19 Vaccine (2 - Pfizer risk series) 12/30/2020    There are no preventive care reminders to display for this patient.  Lab Results  Component Value Date   TSH 1.276 08/18/2017   Lab Results  Component Value Date   WBC 4.6 04/27/2020   HGB 10.2 (L) 04/27/2020   HCT 29.9 (L) 04/27/2020   MCV 88.6 04/27/2020   PLT 86.0 (L) 04/27/2020   Lab Results  Component Value Date   NA 140 04/27/2020   K 4.0 04/27/2020   CO2 32 04/27/2020   GLUCOSE 121 (H) 04/27/2020   BUN 16 04/27/2020   CREATININE 1.60 (H)  04/27/2020   BILITOT 0.6 04/27/2020   ALKPHOS 63 04/27/2020   AST 15 04/27/2020   ALT 8 04/27/2020   PROT 7.4 04/27/2020   ALBUMIN 4.6 04/27/2020   CALCIUM 9.6 04/27/2020   ANIONGAP 10 11/28/2017   GFR 49.03 (L) 04/27/2020   Lab Results  Component Value Date   CHOL 132 04/27/2020   Lab Results  Component Value Date   HDL 39.60 04/27/2020   Lab Results  Component Value Date   LDLCALC 78 04/27/2020   Lab Results  Component Value Date   TRIG 71.0 04/27/2020   Lab Results  Component Value Date   CHOLHDL 3 04/27/2020   Lab Results  Component Value Date   HGBA1C 4.9 08/18/2017      Assessment & Plan:   #1 hyperlipidemia.  Patient on simvastatin. -Check lipid and hepatic panel  #2 history of atrial fibrillation.  Patient on low-dose metoprolol and Eliquis -Check CBC and basic metabolic panel  #3 hypertension stable -Check basic metabolic panel  #4 history of tachycardia-bradycardia syndrome.  Currently stable.  No recent dizziness or syncope. -Continue close follow-up with cardiology   No orders of the defined types were placed in this encounter.   Follow-up: No follow-ups on file.    Carolann Littler, MD

## 2021-07-22 NOTE — Progress Notes (Signed)
Subjective:   Joshua Burns is a 87 y.o. male who presents for an Subsequent Medicare Annual Wellness Visit.  Review of Systems    N/a       Objective:    There were no vitals filed for this visit. There is no height or weight on file to calculate BMI.  Advanced Directives 07/20/2020 12/26/2017 11/28/2017 11/28/2017 08/18/2017 01/27/2017 09/02/2015  Does Patient Have a Medical Advance Directive? Yes Yes Yes Yes No No No  Type of Paramedic of [place];Living will - [place];Living will [place];Living will - - -  Does patient want to make changes to medical advance directive? No - Patient declined - - No - Patient declined - - -  Copy of [place] in Chart? Yes - validated most recent copy scanned in chart (See row information) - No - copy requested No - copy requested - - -  Would patient like information on creating a medical advance directive? - - - - No - Patient declined - No - patient declined information    Current Medications (verified) Outpatient Encounter Medications as of 07/22/2021  Medication Sig   donepezil (ARICEPT) 10 MG tablet TAKE 1 TABLET BY MOUTH EVERYDAY AT BEDTIME   ELIQUIS 2.5 MG TABS tablet TAKE 1 TABLET BY MOUTH TWICE A DAY   hydrochlorothiazide (HYDRODIURIL) 12.5 MG tablet TAKE 1 TABLET BY MOUTH EVERY DAY   KLOR-CON M20 20 MEQ tablet TAKE 1 TABLET BY MOUTH DAILY. NEEDS OFFICE VISIT FOR FURTHER REFILLS CALL [phone]   metoprolol tartrate (LOPRESSOR) 25 MG tablet TAKE 1 TABLET BY MOUTH TWICE A DAY   Multiple Vitamin (MULTIVITAMIN WITH MINERALS) TABS tablet Take 1 tablet by mouth daily.   simvastatin (ZOCOR) 20 MG tablet TAKE 1 TABLET BY MOUTH EVERY DAY AT NIGHT TIME   No facility-administered encounter medications on file as of 07/22/2021.    Allergies (verified) Ace inhibitors and Diltiazem   History: Past Medical History:  Diagnosis Date   Alzheimer disease ([place])     Arthritis    "knees" (11/28/2017)   BPH (benign prostatic hyperplasia) 01/28/2010   Qualifier: Diagnosis of  By: Joyce Gross     CAD (coronary artery disease)    Cognitive impairment 12/05/2016   Glaucoma, both eyes    HYPERLIPIDEMIA 01/28/2010   HYPERTENSION 01/28/2010   HYPERTROPHY PROSTATE W/UR OBST & OTH LUTS 01/28/2010   Paroxysmal atrial fibrillation ([place]) 11/02/2017   Presence of permanent cardiac pacemaker 11/28/2017   Second degree Mobitz I AV block    Sick sinus syndrome ([place]) 08/18/2017   Syncope    Tachycardia-bradycardia syndrome ([place]) 11/28/2017   Past Surgical History:  Procedure Laterality Date   ANTERIOR CERVICAL DECOMP/DISCECTOMY FUSION  02/2003   Archie Endo 05/08/2011   BRAIN SURGERY     CARDIOVASCULAR STRESS TEST  08-04-2008   EF 0%   CATARACT EXTRACTION W/ INTRAOCULAR LENS  IMPLANT, BILATERAL Bilateral    CORONARY ANGIOPLASTY  01/2003   Archie Endo 05/08/2011   INGUINAL HERNIA REPAIR Right 03/2004   Archie Endo 05/08/2011   INSERT / REPLACE / REMOVE PACEMAKER  11/28/2017   KNEE ARTHROSCOPY Left 09/2011   Archie Endo 09/26/2011   LOOP RECORDER INSERTION N/A 08/20/2017   Procedure: LOOP RECORDER INSERTION;  Surgeon: Sanda Klein, MD;  Location: [facility];  Service: Cardiovascular;  Laterality: N/A;   LOOP RECORDER REMOVAL  11/28/2017   LOOP RECORDER REMOVAL N/A 11/28/2017   Procedure: LOOP RECORDER REMOVAL;  Surgeon: Sanda Klein,  MD;  Location: [facility];  Service: Cardiovascular;  Laterality: N/A;   PACEMAKER IMPLANT N/A 11/28/2017   Procedure: PACEMAKER IMPLANT - DUAL CHAMBER;  Surgeon: Sanda Klein, MD;  Location: [facility];  Service: Cardiovascular;  Laterality: N/A;   PROSTATE BIOPSY  03/2008   US ECHOCARDIOGRAPHY  08-04-2008   Est EF 55-60%   Family History  Problem Relation Age of Onset   Heart disease Mother    Heart disease Father    Aneurysm Brother    Social History   Socioeconomic History   Marital status: Widowed    Spouse name:  Not on file   Number of children: 3   Years of education: Not on file   Highest education level: Not on file  Occupational History    Employer: [employer]  Tobacco Use   Smoking status: Former    Packs/day: 1.00    Years: 38.00    Pack years: 38.00    Types: Cigarettes    Quit date: 12/25/1978    Years since quitting: 42.6   Smokeless tobacco: Never  Vaping Use   Vaping Use: Never used  Substance and Sexual Activity   Alcohol use: No    Comment: does not    Drug use: No   Sexual activity: Not on file  Other Topics Concern   Not on file  Social History Narrative   Not on file   Social Determinants of Health   Financial Resource Strain: Not on file  Food Insecurity: Not on file  Transportation Needs: Not on file  Physical Activity: Not on file  Stress: Not on file  Social Connections: Not on file    Tobacco Counseling Counseling given: Not Answered   Clinical Intake:                 Diabetic?no         Activities of Daily Living No flowsheet data found.  Patient Care Team: Eulas Post, MD as PCP - General [place], Peter M, MD (Cardiology)  Indicate any recent Medical Services you may have received from other than [facility] providers in the past year (date may be approximate).     Assessment:   This is a routine wellness examination for Joshua Burns.  Hearing/Vision screen No results found.  Dietary issues and exercise activities discussed:     Goals Addressed   None    Depression Screen PHQ 2/9 Scores 07/20/2020 12/26/2017 10/31/2016 07/15/2015 01/14/2015 01/14/2015 11/06/2013  PHQ - 2 Score 0 0 0 0 0 0 0  PHQ- 9 Score 0 - - - - - -    Fall Risk Fall Risk  07/20/2020 04/27/2020 11/18/2019 12/26/2017 11/29/2017  Falls in the past year? 0 0 0 Yes No  Comment - - Emmi Telephone Survey: data to providers prior to load - Emmi Telephone Survey: data to providers prior to load  Number falls in past yr: 0 0 - 1 -  Injury with Fall? 0 - - Yes -   Comment - - - Head injury -  Risk for fall due to : Medication side effect;No Fall Risks - - Other (Comment) -  Risk for fall due to: Comment - - - needed a pace maker -  Follow up Falls evaluation completed;Falls prevention discussed Education provided - - -    FALL RISK PREVENTION PERTAINING TO THE HOME:  Any stairs in or around the home? Yes  If so, are there any without handrails? No  Home  free of loose throw rugs in walkways, pet beds, electrical cords, etc? Yes  Adequate lighting in your home to reduce risk of falls? Yes   ASSISTIVE DEVICES UTILIZED TO PREVENT FALLS:  Life alert? No  Use of a cane, walker or w/c? No  Grab bars in the bathroom? No  Shower chair or bench in shower? No  Elevated toilet seat or a handicapped toilet? No   TIMED UP AND GO:  Was the test performed? Yes .  Length of time to ambulate 10 feet: 12 sec.   Gait slow and steady without use of assistive device  Cognitive Function: Normal cognitive status assessed by direct observation by this Nurse Health Advisor. No abnormalities found.   MMSE - Mini Mental State Exam 12/26/2017  Orientation to time 2  Orientation to time comments missed day, year and date  Orientation to Place 3  Orientation to Place-comments could not think of Dr.  Elease Hashimoto name, did not know county  Registration 3  Attention/ Calculation 3  Recall 0  Language- name 2 objects 2  Language- repeat 1  Language- follow 3 step command 3  Language- read & follow direction 1  Write a sentence 1  Copy design 1  Total score 20     6CIT Screen 07/20/2020  What Year? 0 points  What month? 0 points  What time? 0 points  Count back from 20 0 points  Months in reverse 2 points  Repeat phrase 10 points  Total Score 12    Immunizations Immunization History  Administered Date(s) Administered   Fluad Quad(high Dose 65+) 09/30/2019   Influenza Split 09/19/2012   Influenza, High Dose Seasonal PF 09/20/2018   Influenza, Seasonal,  Injecte, Preservative Fre 09/29/2013, 09/30/2014   Influenza,inj,Quad PF,6+ Mos 09/09/2015   Influenza-Unspecified 09/17/2016, 10/01/2017   Pneumococcal Conjugate-13 12/05/2016   Pneumococcal Polysaccharide-23 06/13/2012   Tdap 08/18/2017   Zoster, Live 07/15/2012    TDAP status: Up to date  Flu Vaccine status: Up to date  Pneumococcal vaccine status: Up to date  Covid-19 vaccine status: Completed vaccines  Qualifies for Shingles Vaccine? Yes   Zostavax completed No   Shingrix Completed?: No.    Education has been provided regarding the importance of this vaccine. Patient has been advised to call insurance company to determine out of pocket expense if they have not yet received this vaccine. Advised may also receive vaccine at local pharmacy or Health Dept. Verbalized acceptance and understanding.  Screening Tests Health Maintenance  Topic Date Due   Zoster Vaccines- Shingrix (1 of 2) Never done   COVID-19 Vaccine (2 - Pfizer risk series) 12/30/2020   INFLUENZA VACCINE  07/25/2021   TETANUS/TDAP  08/19/2027   PNA vac Low Risk Adult  Completed   HPV VACCINES  Aged Out    Health Maintenance  Health Maintenance Due  Topic Date Due   Zoster Vaccines- Shingrix (1 of 2) Never done   COVID-19 Vaccine (2 - Pfizer risk series) 12/30/2020    Colorectal cancer screening: No longer required.   Lung Cancer Screening: (Low Dose CT Chest recommended if Age 41-80 years, 30 pack-year currently smoking OR have quit w/in 15years.) does not qualify.   Lung Cancer Screening Referral: n/a  Additional Screening:  Hepatitis C Screening: does not qualify;  Vision Screening: Recommended annual ophthalmology exams for early detection of glaucoma and other disorders of the eye. Is the patient up to date with their annual eye exam?  Yes  Who is the provider or  what is the name of the office in which the patient attends annual eye exams? [facility] cannot recal Dr. Name  If pt is not established  with a provider, would they like to be referred to a provider to establish care? No .   Dental Screening: Recommended annual dental exams for proper oral hygiene  Community Resource Referral / Chronic Care Management: CRR required this visit?  No   CCM required this visit?  No      Plan:     I have personally reviewed and noted the following in the patient's chart:   Medical and social history Use of alcohol, tobacco or illicit drugs  Current medications and supplements including opioid prescriptions. Patient is not currently taking opioid prescriptions. Functional ability and status Nutritional status Physical activity Advanced directives List of other physicians Hospitalizations, surgeries, and ER visits in previous 12 months Vitals Screenings to include cognitive, depression, and falls Referrals and appointments  In addition, I have reviewed and discussed with patient certain preventive protocols, quality metrics, and best practice recommendations. A written personalized care plan for preventive services as well as general preventive health recommendations were provided to patient.     Randel Pigg, LPN   [id number]   Nurse Notes: none

## 2021-07-22 NOTE — Patient Instructions (Signed)
[name] , Thank you for taking time to come for your Medicare Wellness Visit. I appreciate your ongoing commitment to your health goals. Please review the following plan we discussed and let me know if I can assist you in the future.   Screening recommendations/referrals: Colonoscopy: no longer required  Recommended yearly ophthalmology/optometry visit for glaucoma screening and checkup Recommended yearly dental visit for hygiene and checkup  Vaccinations: Influenza vaccine: due fall 2022  Pneumococcal vaccine: completed series  Tdap vaccine: 08/18/2017  due 2028 Shingles vaccine: will consider     Advanced directives: will provide copies   Conditions/risks identified: none   Next appointment: none   Preventive Care 65 Years and Older, Male Preventive care refers to lifestyle choices and visits with your health care provider that can promote health and wellness. What does preventive care include? A yearly physical exam. This is also called an annual well check. Dental exams once or twice a year. Routine eye exams. Ask your health care provider how often you should have your eyes checked. Personal lifestyle choices, including: Daily care of your teeth and gums. Regular physical activity. Eating a healthy diet. Avoiding tobacco and drug use. Limiting alcohol use. Practicing safe sex. Taking low doses of aspirin every day. Taking vitamin and mineral supplements as recommended by your health care provider. What happens during an annual well check? The services and screenings done by your health care provider during your annual well check will depend on your age, overall health, lifestyle risk factors, and family history of disease. Counseling  Your health care provider may ask you questions about your: Alcohol use. Tobacco use. Drug use. Emotional well-being. Home and relationship well-being. Sexual activity. Eating habits. History of falls. Memory and ability to  understand (cognition). Work and work Statistician. Screening  You may have the following tests or measurements: Height, weight, and BMI. Blood pressure. Lipid and cholesterol levels. These may be checked every 5 years, or more frequently if you are over 2 years old. Skin check. Lung cancer screening. You may have this screening every year starting at age 37 if you have a 30-pack-year history of smoking and currently smoke or have quit within the past 15 years. Fecal occult blood test (FOBT) of the stool. You may have this test every year starting at age 30. Flexible sigmoidoscopy or colonoscopy. You may have a sigmoidoscopy every 5 years or a colonoscopy every 10 years starting at age 17. Prostate cancer screening. Recommendations will vary depending on your family history and other risks. Hepatitis C blood test. Hepatitis B blood test. Sexually transmitted disease (STD) testing. Diabetes screening. This is done by checking your blood sugar (glucose) after you have not eaten for a while (fasting). You may have this done every 1-3 years. Abdominal aortic aneurysm (AAA) screening. You may need this if you are a current or former smoker. Osteoporosis. You may be screened starting at age 80 if you are at high risk. Talk with your health care provider about your test results, treatment options, and if necessary, the need for more tests. Vaccines  Your health care provider may recommend certain vaccines, such as: Influenza vaccine. This is recommended every year. Tetanus, diphtheria, and acellular pertussis (Tdap, Td) vaccine. You may need a Td booster every 10 years. Zoster vaccine. You may need this after age 42. Pneumococcal 13-valent conjugate (PCV13) vaccine. One dose is recommended after age 24. Pneumococcal polysaccharide (PPSV23) vaccine. One dose is recommended after age 73. Talk to your health care provider  about which screenings and vaccines you need and how often you need them. This  information is not intended to replace advice given to you by your health care provider. Make sure you discuss any questions you have with your health care provider. Document Released: 01/07/2016 Document Revised: 08/30/2016 Document Reviewed: 10/12/2015 Elsevier Interactive Patient Education  2017 [place] Prevention in the Home Falls can cause injuries. They can happen to people of all ages. There are many things you can do to make your home safe and to help prevent falls. What can I do on the outside of my home? Regularly fix the edges of walkways and driveways and fix any cracks. Remove anything that might make you trip as you walk through a door, such as a raised step or threshold. Trim any bushes or trees on the path to your home. Use bright outdoor lighting. Clear any walking paths of anything that might make someone trip, such as rocks or tools. Regularly check to see if handrails are loose or broken. Make sure that both sides of any steps have handrails. Any raised decks and porches should have guardrails on the edges. Have any leaves, snow, or ice cleared regularly. Use sand or salt on walking paths during winter. Clean up any spills in your garage right away. This includes oil or grease spills. What can I do in the bathroom? Use night lights. Install grab bars by the toilet and in the tub and shower. Do not use towel bars as grab bars. Use non-skid mats or decals in the tub or shower. If you need to sit down in the shower, use a plastic, non-slip stool. Keep the floor dry. Clean up any water that spills on the floor as soon as it happens. Remove soap buildup in the tub or shower regularly. Attach bath mats securely with double-sided non-slip rug tape. Do not have throw rugs and other things on the floor that can make you trip. What can I do in the bedroom? Use night lights. Make sure that you have a light by your bed that is easy to reach. Do not use any sheets or  blankets that are too big for your bed. They should not hang down onto the floor. Have a firm chair that has side arms. You can use this for support while you get dressed. Do not have throw rugs and other things on the floor that can make you trip. What can I do in the kitchen? Clean up any spills right away. Avoid walking on wet floors. Keep items that you use a lot in easy-to-reach places. If you need to reach something above you, use a strong step stool that has a grab bar. Keep electrical cords out of the way. Do not use floor polish or wax that makes floors slippery. If you must use wax, use non-skid floor wax. Do not have throw rugs and other things on the floor that can make you trip. What can I do with my stairs? Do not leave any items on the stairs. Make sure that there are handrails on both sides of the stairs and use them. Fix handrails that are broken or loose. Make sure that handrails are as long as the stairways. Check any carpeting to make sure that it is firmly attached to the stairs. Fix any carpet that is loose or worn. Avoid having throw rugs at the top or bottom of the stairs. If you do have throw rugs, attach them to the floor  with carpet tape. Make sure that you have a light switch at the top of the stairs and the bottom of the stairs. If you do not have them, ask someone to add them for you. What else can I do to help prevent falls? Wear shoes that: Do not have high heels. Have rubber bottoms. Are comfortable and fit you well. Are closed at the toe. Do not wear sandals. If you use a stepladder: Make sure that it is fully opened. Do not climb a closed stepladder. Make sure that both sides of the stepladder are locked into place. Ask someone to hold it for you, if possible. Clearly mark and make sure that you can see: Any grab bars or handrails. First and last steps. Where the edge of each step is. Use tools that help you move around (mobility aids) if they are  needed. These include: Canes. Walkers. Scooters. Crutches. Turn on the lights when you go into a dark area. Replace any light bulbs as soon as they burn out. Set up your furniture so you have a clear path. Avoid moving your furniture around. If any of your floors are uneven, fix them. If there are any pets around you, be aware of where they are. Review your medicines with your doctor. Some medicines can make you feel dizzy. This can increase your chance of falling. Ask your doctor what other things that you can do to help prevent falls. This information is not intended to replace advice given to you by your health care provider. Make sure you discuss any questions you have with your health care provider. Document Released: 10/07/2009 Document Revised: 05/18/2016 Document Reviewed: 01/15/2015 Elsevier Interactive Patient Education  2017 [employer].

## 2021-08-22 ENCOUNTER — Encounter: Payer: Medicare Other | Admitting: Cardiovascular Disease

## 2021-09-07 ENCOUNTER — Ambulatory Visit (INDEPENDENT_AMBULATORY_CARE_PROVIDER_SITE_OTHER): Payer: Medicare Other

## 2021-09-07 DIAGNOSIS — I495 Sick sinus syndrome: Secondary | ICD-10-CM | POA: Diagnosis not present

## 2021-09-07 LAB — CUP PACEART REMOTE DEVICE CHECK
Battery Remaining Longevity: 87 mo
Battery Voltage: 2.98 V
Brady Statistic AP VP Percent: 74.79 %
Brady Statistic AP VS Percent: 19.79 %
Brady Statistic AS VP Percent: 1.21 %
Brady Statistic AS VS Percent: 4.2 %
Brady Statistic RA Percent Paced: 94.63 %
Brady Statistic RV Percent Paced: 76.01 %
Date Time Interrogation Session: 20220914005419
Implantable Lead Implant Date: 20181205
Implantable Lead Implant Date: 20181205
Implantable Lead Location: 753859
Implantable Lead Location: 753860
Implantable Lead Model: 5076
Implantable Lead Model: 5076
Implantable Pulse Generator Implant Date: 20181205
Lead Channel Impedance Value: 266 Ohm
Lead Channel Impedance Value: 285 Ohm
Lead Channel Impedance Value: 342 Ohm
Lead Channel Impedance Value: 399 Ohm
Lead Channel Pacing Threshold Amplitude: 0.625 V
Lead Channel Pacing Threshold Amplitude: 1.125 V
Lead Channel Pacing Threshold Pulse Width: 0.4 ms
Lead Channel Pacing Threshold Pulse Width: 0.4 ms
Lead Channel Sensing Intrinsic Amplitude: 1 mV
Lead Channel Sensing Intrinsic Amplitude: 1 mV
Lead Channel Sensing Intrinsic Amplitude: 7.125 mV
Lead Channel Sensing Intrinsic Amplitude: 7.125 mV
Lead Channel Setting Pacing Amplitude: 1.5 V
Lead Channel Setting Pacing Amplitude: 2.5 V
Lead Channel Setting Pacing Pulse Width: 0.4 ms
Lead Channel Setting Sensing Sensitivity: 0.9 mV

## 2021-09-14 NOTE — Progress Notes (Signed)
Remote pacemaker transmission.   

## 2021-09-17 ENCOUNTER — Encounter: Payer: Self-pay | Admitting: *Deleted

## 2021-09-19 ENCOUNTER — Other Ambulatory Visit: Payer: Self-pay | Admitting: Cardiovascular Disease

## 2021-09-19 ENCOUNTER — Encounter: Payer: Medicare Other | Admitting: Cardiovascular Disease

## 2021-10-10 ENCOUNTER — Other Ambulatory Visit: Payer: Self-pay

## 2021-10-10 ENCOUNTER — Encounter: Payer: Self-pay | Admitting: Cardiovascular Disease

## 2021-10-10 ENCOUNTER — Ambulatory Visit (INDEPENDENT_AMBULATORY_CARE_PROVIDER_SITE_OTHER): Payer: Medicare Other | Admitting: Cardiovascular Disease

## 2021-10-10 ENCOUNTER — Encounter: Payer: Medicare Other | Admitting: Cardiovascular Disease

## 2021-10-10 VITALS — BP 122/79 | HR 78 | Ht 64.0 in | Wt 135.4 lb

## 2021-10-10 DIAGNOSIS — I10 Essential (primary) hypertension: Secondary | ICD-10-CM

## 2021-10-10 DIAGNOSIS — I4729 Other ventricular tachycardia: Secondary | ICD-10-CM

## 2021-10-10 DIAGNOSIS — Z950 Presence of cardiac pacemaker: Secondary | ICD-10-CM | POA: Diagnosis not present

## 2021-10-10 DIAGNOSIS — I495 Sick sinus syndrome: Secondary | ICD-10-CM | POA: Diagnosis not present

## 2021-10-10 DIAGNOSIS — I471 Supraventricular tachycardia: Secondary | ICD-10-CM

## 2021-10-10 DIAGNOSIS — D649 Anemia, unspecified: Secondary | ICD-10-CM | POA: Diagnosis not present

## 2021-10-10 DIAGNOSIS — Z7901 Long term (current) use of anticoagulants: Secondary | ICD-10-CM | POA: Diagnosis not present

## 2021-10-10 DIAGNOSIS — E7800 Pure hypercholesterolemia, unspecified: Secondary | ICD-10-CM | POA: Diagnosis not present

## 2021-10-10 DIAGNOSIS — I441 Atrioventricular block, second degree: Secondary | ICD-10-CM

## 2021-10-10 DIAGNOSIS — I4719 Other supraventricular tachycardia: Secondary | ICD-10-CM

## 2021-10-10 LAB — PACEMAKER DEVICE OBSERVATION

## 2021-10-10 NOTE — Progress Notes (Signed)
Cardiology Office Note:    Date:  10/12/2021   ID:  Joshua Burns, DOB 1925-03-12, MRN [id number]  PCP:  Eulas Post, MD  Cardiologist:  Sanda Klein, MD    Referring MD: Eulas Post, MD   Chief Complaint  Patient presents with   Atrial Fibrillation     History of Present Illness:    Joshua Burns is a 87 y.o. male with a hx of syncope due to sinus pauses,  paroxysmal atrial fibrillation with rapid ventricular response s/p dual-chamber permanent pacemaker implantation in December 2018 ([employer]tr[employer] Azure MRI conditional).  He has not had any syncope since pacemaker implantation.  He has never had stroke/TIA or other embolic events.    He's done quite well since his last appointment without any cardiovascular complaints. The patient specifically denies any chest pain at rest exertion, dyspnea at rest or with exertion, orthopnea, paroxysmal nocturnal dyspnea, syncope, palpitations, focal neurological deficits, intermittent claudication, lower extremity edema, unexplained weight gain, cough, hemoptysis or wheezing.  He has not had any falls or bleeding problems.  Pacemaker interrogation shows normal device function.  Estimated generator longevity is 7 years he is not pacemaker dependent: He has underlying severe sinus bradycardia in the 30s.  He has 94% atrial pacing and 74% ventricular pacing.  The device has recorded relatively frequent episodes of high ventricular rate, the vast majority of which are paroxysmal atrial tachycardia with 1: 1 conduction.  He did have one 3-second (14 beat) episode of nonsustained VT on September 25.  As far as I can tell all of these episodes are asymptomatic.  Activity level is approximately an hour per day, stable.  Heart rate histograms appear appropriate.  He has treated hypertension and hyperlipidemia.  He has normal left ventricular systolic function and had a normal stress test in the remote past.  He has progressive but still mild dementia.   His blood pressure is often elevated when first checked in the office, improves if he is allowed to relax for a while.  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    Past Surgical History:  Procedure Laterality Date   ANTERIOR CERVICAL DECOMP/DISCECTOMY FUSION  02/2003   Archie Endo 05/08/2011   BRAIN SURGERY     CARDIOVASCULAR STRESS TEST  08-04-2008   EF 0%   CATARACT EXTRACTION W/ INTRAOCULAR LENS  IMPLANT, BILATERAL Bilateral    CORONARY ANGIOPLASTY  01/2003   Archie Endo 05/08/2011   INGUINAL HERNIA REPAIR Right 03/2004   Archie Endo 05/08/2011   INSERT / REPLACE / REMOVE PACEMAKER  11/28/2017   KNEE ARTHROSCOPY Left 09/2011   Archie Endo 09/26/2011   LOOP RECORDER INSERTION N/A 08/20/2017   Procedure: LOOP RECORDER INSERTION;  Surgeon: Sanda Klein, MD;  Location: [facility];  Service: Cardiovascular;  Laterality: N/A;   LOOP RECORDER REMOVAL  11/28/2017   LOOP RECORDER REMOVAL N/A 11/28/2017  Procedure: LOOP RECORDER REMOVAL;  Surgeon: Sanda Klein, MD;  Location: [facility];  Service: Cardiovascular;  Laterality: N/A;   PACEMAKER IMPLANT N/A 11/28/2017   Procedure: PACEMAKER IMPLANT - DUAL CHAMBER;  Surgeon: Sanda Klein, MD;  Location: [facility];  Service: Cardiovascular;  Laterality: N/A;   PROSTATE BIOPSY  03/2008   US ECHOCARDIOGRAPHY  08-04-2008   Est EF 55-60%    Current  Medications: Current Meds  Medication Sig   donepezil (ARICEPT) 10 MG tablet TAKE 1 TABLET BY MOUTH EVERYDAY AT BEDTIME   ELIQUIS 2.5 MG TABS tablet TAKE 1 TABLET BY MOUTH TWICE A DAY   hydrochlorothiazide (HYDRODIURIL) 12.5 MG tablet TAKE 1 TABLET BY MOUTH EVERY DAY   KLOR-CON M20 20 MEQ tablet TAKE 1 TABLET BY MOUTH DAILY. NEEDS OFFICE VISIT FOR FURTHER REFILLS CALL [phone]   metoprolol tartrate (LOPRESSOR) 25 MG tablet TAKE 1 TABLET BY MOUTH TWICE A DAY   Multiple Vitamin (MULTIVITAMIN WITH MINERALS) TABS tablet Take 1 tablet by mouth daily.   simvastatin (ZOCOR) 20 MG tablet TAKE 1 TABLET BY MOUTH EVERY DAY AT NIGHT TIME     Allergies:   Ace inhibitors and Diltiazem   Social History   Socioeconomic History   Marital status: Widowed    Spouse name: Not on file   Number of children: 3   Years of education: Not on file   Highest education level: Not on file  Occupational History    Employer: [employer]  Tobacco Use   Smoking status: Former    Packs/day: 1.00    Years: 38.00    Pack years: 38.00    Types: Cigarettes    Quit date: 12/25/1978    Years since quitting: 42.8   Smokeless tobacco: Never  Vaping Use   Vaping Use: Never used  Substance and Sexual Activity   Alcohol use: No    Comment: does not    Drug use: No   Sexual activity: Not on file  Other Topics Concern   Not on file  Social History Narrative   Not on file   Social Determinants of Health   Financial Resource Strain: Low Risk    Difficulty of Paying Living Expenses: Not hard at all  Food Insecurity: No Food Insecurity   Worried About Charity fundraiser in the Last Year: Never true   [place] in the Last Year: Never true  Transportation Needs: No Transportation Needs   Lack of Transportation (Medical): No   Lack of Transportation (Non-Medical): No  Physical Activity: Inactive   Days of Exercise per Week: 0 days   Minutes of Exercise per Session: 0 min  Stress: No Stress Concern  Present   Feeling of Stress : Not at all  Social Connections: Socially Isolated   Frequency of Communication with Friends and Family: Twice a week   Frequency of Social Gatherings with Friends and Family: Twice a week   Attends Religious Services: Never   Marine scientist or Organizations: No   Attends Archivist Meetings: Never   Marital Status: Widowed     Family History: The patient's family history includes Aneurysm in his brother; Heart disease in his father and mother. ROS:   Please see the history of present illness.    All other systems are reviewed and are negative  EKGs/Labs/Other Studies Reviewed:    EKG:  EKG is ordered today.  Personally reviewed, shows intermittent atrial paced/atrial sensed rhythm, consistently ventricular  sensed on current tracing, left ventricular hypertrophy with secondary ST-T changes most obvious in leads V3-V6, QTC normal at 430 ms  Recent Labs: 07/22/2021: ALT 7; BUN 18; Creatinine, Ser 1.74; Hemoglobin 9.8; Platelets 76.0; Potassium 4.5; Sodium 143  Recent Lipid Panel    Component Value Date/Time   CHOL 130 07/22/2021 1436   TRIG 111.0 07/22/2021 1436   HDL 38.90 (L) 07/22/2021 1436   CHOLHDL 3 07/22/2021 1436   VLDL 22.2 07/22/2021 1436   LDLCALC 69 07/22/2021 1436    Physical Exam:    VS:  BP 122/79 (BP Location: Left Arm, Patient Position: Sitting, Cuff Size: Normal)   Pulse 78   Ht 5\' 4"  (1.626 m)   Wt 135 lb 6.4 oz (61.4 kg)   SpO2 99%   BMI 23.24 kg/m     Wt Readings from Last 3 Encounters:  10/10/21 135 lb 6.4 oz (61.4 kg)  07/22/21 138 lb (62.6 kg)  06/21/20 139 lb 9.6 oz (63.3 kg)      General: Alert, oriented x3, no distress, appears healthy and fit, younger than stated age, well-healed left subclavian pacemaker site. Head: no evidence of trauma, PERRL, EOMI, no exophtalmos or lid lag, no myxedema, no xanthelasma; normal ears, nose and oropharynx Neck: normal jugular venous pulsations and no  hepatojugular reflux; brisk carotid pulses without delay and no carotid bruits Chest: clear to auscultation, no signs of consolidation by percussion or palpation, normal fremitus, symmetrical and full respiratory excursions Cardiovascular: normal position and quality of the apical impulse, regular rhythm, normal first and second heart sounds, no murmurs, rubs or gallops Abdomen: no tenderness or distention, no masses by palpation, no abnormal pulsatility or arterial bruits, normal bowel sounds, no hepatosplenomegaly Extremities: no clubbing, cyanosis or edema; 2+ radial, ulnar and brachial pulses bilaterally; 2+ right femoral, posterior tibial and dorsalis pedis pulses; 2+ left femoral, posterior tibial and dorsalis pedis pulses; no subclavian or femoral bruits Neurological: grossly nonfocal Psych: Normal mood and affect    ASSESSMENT:    1. PAT (paroxysmal atrial tachycardia) ([place])   2. Sick sinus syndrome ([place])   3. Second degree Mobitz I AV block   4. Pacemaker   5. NSVT (nonsustained ventricular tachycardia)   6. Hypercholesterolemia   7. Essential hypertension   8. Long term (current) use of anticoagulants   9. Anemia, unspecified type     PLAN:    In order of problems listed above:  PAT: Atrial fibrillation was suspected based on implantable loop recorder tracings, but since pacemaker implantation we have not seen true atrial fibrillation.  Frequent PAT, sometimes w block is seen. CHADSVasc 3 (age 55, HTN).  I would have a low threshold for discontinuing his anticoagulation if he has any bleeding complications. SSS/Tachy-brady: Histograms suggest appropriate sensor settings for activity level. 2nd deg AVB: His burden of ventricular pacing has increased again, but after increasing the paced AV delay to 300 ms (with extension to 400 ms) he had native AV conduction, at least while I was monitoring him during the office. PPM: Normal device function.  Only change today was extending the  AV delay.  Programmed MVP (R). NSVT: rare and asymptomatic HLP: LDL 69 on recent labs. Continue statin. HTN: Blood pressure is high in the office, but reportedly systolic blood pressure consistently in the 130s at home.  No changes made to his medications. Eliquis: denies falls/bleeding. Anemia: stable, mild, associated w thrombocytopenia, normal MCV.  Patient Instructions  Medication Instructions:  No changes *If you need a  refill on your cardiac medications before your next appointment, please call your pharmacy*   Lab Work: None ordered If you have labs (blood work) drawn today and your tests are completely normal, you will receive your results only by: [place] (if you have MyChart) OR A paper copy in the mail If you have any lab test that is abnormal or we need to change your treatment, we will call you to review the results.   Testing/Procedures: None ordered   Follow-Up: At [facility], you and your health needs are our priority.  As part of our continuing mission to provide you with exceptional heart care, we have created designated Provider Care Teams.  These Care Teams include your primary Cardiologist (physician) and Advanced Practice Providers (APPs -  Physician Assistants and Nurse Practitioners) who all work together to provide you with the care you need, when you need it.  We recommend signing up for the patient portal called "MyChart".  Sign up information is provided on this After Visit Summary.  MyChart is used to connect with patients for Virtual Visits (Telemedicine).  Patients are able to view lab/test results, encounter notes, upcoming appointments, etc.  Non-urgent messages can be sent to your provider as well.   To learn more about what you can do with MyChart, go to [email or web address].    Your next appointment:   12 month(s)  The format for your next appointment:   In Person  Provider:   Sanda Klein, MD     Medication  Adjustments/Labs and Tests Ordered: Current medicines are reviewed at length with the patient today.  Concerns regarding medicines are outlined above.  Orders Placed This Encounter  Procedures   EKG 12-Lead    No orders of the defined types were placed in this encounter.   Signed, Sanda Klein, MD  10/12/2021 8:38 AM    [facility]

## 2021-10-10 NOTE — Patient Instructions (Signed)

## 2021-10-12 ENCOUNTER — Encounter: Payer: Self-pay | Admitting: Cardiovascular Disease

## 2021-10-13 ENCOUNTER — Telehealth: Payer: Self-pay | Admitting: Family Medicine

## 2021-10-13 NOTE — Telephone Encounter (Signed)
Patient had a shingles vaccine on 07/15/2012. Spoke with the patient's daughter and she would like to know if he can go ahead and get the second vaccine or does he need to have both vaccines done since it has been a while since he received the first vaccine.

## 2021-10-13 NOTE — Telephone Encounter (Signed)
Pt daughter [name] is calling and would like to see if her dad had shingle vaccine she check his mychart and did not see shingle listed. Please advise

## 2021-10-14 ENCOUNTER — Other Ambulatory Visit: Payer: Self-pay | Admitting: Family Medicine

## 2021-10-14 ENCOUNTER — Other Ambulatory Visit: Payer: Self-pay | Admitting: Cardiovascular Disease

## 2021-10-14 DIAGNOSIS — Z23 Encounter for immunization: Secondary | ICD-10-CM | POA: Diagnosis not present

## 2021-10-14 NOTE — Telephone Encounter (Signed)
Left message for patient's daughter to call back. 

## 2021-10-17 NOTE — Telephone Encounter (Signed)
Left message for patient to call back  

## 2021-10-18 NOTE — Telephone Encounter (Signed)
Spoke with the patient's son. He stated they have already gone to [facility] and gotten this vaccine.

## 2021-11-15 ENCOUNTER — Other Ambulatory Visit: Payer: Self-pay | Admitting: Cardiovascular Disease

## 2021-12-07 ENCOUNTER — Ambulatory Visit (INDEPENDENT_AMBULATORY_CARE_PROVIDER_SITE_OTHER): Payer: Medicare Other

## 2021-12-07 DIAGNOSIS — I495 Sick sinus syndrome: Secondary | ICD-10-CM

## 2021-12-07 LAB — CUP PACEART REMOTE DEVICE CHECK
Battery Remaining Longevity: 85 mo
Battery Voltage: 2.98 V
Brady Statistic AP VP Percent: 68.18 %
Brady Statistic AP VS Percent: 20.08 %
Brady Statistic AS VP Percent: 3.77 %
Brady Statistic AS VS Percent: 7.97 %
Brady Statistic RA Percent Paced: 88.07 %
Brady Statistic RV Percent Paced: 71.95 %
Date Time Interrogation Session: 20221213235534
Implantable Lead Implant Date: 20181205
Implantable Lead Implant Date: 20181205
Implantable Lead Location: 753859
Implantable Lead Location: 753860
Implantable Lead Model: 5076
Implantable Lead Model: 5076
Implantable Pulse Generator Implant Date: 20181205
Lead Channel Impedance Value: 266 Ohm
Lead Channel Impedance Value: 266 Ohm
Lead Channel Impedance Value: 361 Ohm
Lead Channel Impedance Value: 399 Ohm
Lead Channel Pacing Threshold Amplitude: 0.75 V
Lead Channel Pacing Threshold Amplitude: 1.125 V
Lead Channel Pacing Threshold Pulse Width: 0.4 ms
Lead Channel Pacing Threshold Pulse Width: 0.4 ms
Lead Channel Sensing Intrinsic Amplitude: 0.875 mV
Lead Channel Sensing Intrinsic Amplitude: 0.875 mV
Lead Channel Sensing Intrinsic Amplitude: 5.75 mV
Lead Channel Sensing Intrinsic Amplitude: 5.75 mV
Lead Channel Setting Pacing Amplitude: 1.5 V
Lead Channel Setting Pacing Amplitude: 2.5 V
Lead Channel Setting Pacing Pulse Width: 0.4 ms
Lead Channel Setting Sensing Sensitivity: 0.9 mV

## 2021-12-15 ENCOUNTER — Other Ambulatory Visit: Payer: Self-pay | Admitting: Family Medicine

## 2021-12-16 ENCOUNTER — Encounter: Payer: Self-pay | Admitting: Family Medicine

## 2021-12-16 NOTE — Progress Notes (Signed)
Remote pacemaker transmission.   

## 2022-01-11 ENCOUNTER — Other Ambulatory Visit: Payer: Self-pay | Admitting: Family Medicine

## 2022-01-12 ENCOUNTER — Other Ambulatory Visit: Payer: Self-pay | Admitting: Family Medicine

## 2022-01-12 ENCOUNTER — Other Ambulatory Visit: Payer: Self-pay | Admitting: Cardiovascular Disease

## 2022-03-08 ENCOUNTER — Ambulatory Visit (INDEPENDENT_AMBULATORY_CARE_PROVIDER_SITE_OTHER): Payer: Medicare Other

## 2022-03-08 DIAGNOSIS — I495 Sick sinus syndrome: Secondary | ICD-10-CM | POA: Diagnosis not present

## 2022-03-08 LAB — CUP PACEART REMOTE DEVICE CHECK
Battery Remaining Longevity: 81 mo
Battery Voltage: 2.98 V
Brady Statistic AP VP Percent: 67.61 %
Brady Statistic AP VS Percent: 22.43 %
Brady Statistic AS VP Percent: 4.05 %
Brady Statistic AS VS Percent: 5.9 %
Brady Statistic RA Percent Paced: 89.61 %
Brady Statistic RV Percent Paced: 71.67 %
Date Time Interrogation Session: 20230315005552
Implantable Lead Implant Date: 20181205
Implantable Lead Implant Date: 20181205
Implantable Lead Location: 753859
Implantable Lead Location: 753860
Implantable Lead Model: 5076
Implantable Lead Model: 5076
Implantable Pulse Generator Implant Date: 20181205
Lead Channel Impedance Value: 247 Ohm
Lead Channel Impedance Value: 266 Ohm
Lead Channel Impedance Value: 342 Ohm
Lead Channel Impedance Value: 399 Ohm
Lead Channel Pacing Threshold Amplitude: 0.75 V
Lead Channel Pacing Threshold Amplitude: 1 V
Lead Channel Pacing Threshold Pulse Width: 0.4 ms
Lead Channel Pacing Threshold Pulse Width: 0.4 ms
Lead Channel Sensing Intrinsic Amplitude: 1.5 mV
Lead Channel Sensing Intrinsic Amplitude: 1.5 mV
Lead Channel Sensing Intrinsic Amplitude: 6 mV
Lead Channel Sensing Intrinsic Amplitude: 6 mV
Lead Channel Setting Pacing Amplitude: 1.5 V
Lead Channel Setting Pacing Amplitude: 2.5 V
Lead Channel Setting Pacing Pulse Width: 0.4 ms
Lead Channel Setting Sensing Sensitivity: 0.9 mV

## 2022-03-20 NOTE — Progress Notes (Signed)
Remote pacemaker transmission.   

## 2022-05-06 ENCOUNTER — Other Ambulatory Visit: Payer: Self-pay | Admitting: Family Medicine

## 2022-06-07 ENCOUNTER — Ambulatory Visit (INDEPENDENT_AMBULATORY_CARE_PROVIDER_SITE_OTHER): Payer: Medicare Other

## 2022-06-07 DIAGNOSIS — I495 Sick sinus syndrome: Secondary | ICD-10-CM

## 2022-06-09 LAB — CUP PACEART REMOTE DEVICE CHECK
Battery Remaining Longevity: 77 mo
Battery Voltage: 2.97 V
Brady Statistic AP VP Percent: 64.78 %
Brady Statistic AP VS Percent: 24.99 %
Brady Statistic AS VP Percent: 2.63 %
Brady Statistic AS VS Percent: 7.59 %
Brady Statistic RA Percent Paced: 89.52 %
Brady Statistic RV Percent Paced: 67.42 %
Date Time Interrogation Session: 20230614005502
Implantable Lead Implant Date: 20181205
Implantable Lead Implant Date: 20181205
Implantable Lead Location: 753859
Implantable Lead Location: 753860
Implantable Lead Model: 5076
Implantable Lead Model: 5076
Implantable Pulse Generator Implant Date: 20181205
Lead Channel Impedance Value: 266 Ohm
Lead Channel Impedance Value: 266 Ohm
Lead Channel Impedance Value: 342 Ohm
Lead Channel Impedance Value: 418 Ohm
Lead Channel Pacing Threshold Amplitude: 0.625 V
Lead Channel Pacing Threshold Amplitude: 1 V
Lead Channel Pacing Threshold Pulse Width: 0.4 ms
Lead Channel Pacing Threshold Pulse Width: 0.4 ms
Lead Channel Sensing Intrinsic Amplitude: 1 mV
Lead Channel Sensing Intrinsic Amplitude: 1 mV
Lead Channel Sensing Intrinsic Amplitude: 6.25 mV
Lead Channel Sensing Intrinsic Amplitude: 6.25 mV
Lead Channel Setting Pacing Amplitude: 1.5 V
Lead Channel Setting Pacing Amplitude: 2.5 V
Lead Channel Setting Pacing Pulse Width: 0.4 ms
Lead Channel Setting Sensing Sensitivity: 0.9 mV

## 2022-06-19 NOTE — Progress Notes (Signed)
Remote pacemaker transmission.   

## 2022-07-18 ENCOUNTER — Other Ambulatory Visit: Payer: Self-pay | Admitting: Family Medicine

## 2022-08-14 ENCOUNTER — Ambulatory Visit (INDEPENDENT_AMBULATORY_CARE_PROVIDER_SITE_OTHER): Payer: Medicare Other

## 2022-08-14 VITALS — BP 134/60 | HR 79 | Temp 98.3°F | Ht 64.0 in | Wt 132.0 lb

## 2022-08-14 DIAGNOSIS — Z0000 Encounter for general adult medical examination without abnormal findings: Secondary | ICD-10-CM | POA: Diagnosis not present

## 2022-08-14 NOTE — Progress Notes (Signed)
Subjective:   Joshua Burns is a 88 y.o. male who presents for Medicare Annual/Subsequent preventive examination.  Review of Systems      Cardiac Risk Factors include: advanced age (>79mn, >>40women);hypertension;male gender;Other (see comment), Risk factor comments: CAD     Objective:    Today's Vitals   08/14/22 1054  BP: 134/60  Pulse: 79  Temp: 98.3 F (36.8 C)  TempSrc: Oral  SpO2: 98%  Weight: 132 lb (59.9 kg)  Height: '5\' 4"'$  (1.626 m)   Body mass index is 22.66 kg/m.     08/14/2022   11:04 AM 07/22/2021    2:01 PM 07/20/2020    2:50 PM 12/26/2017   10:37 AM 11/28/2017    8:58 PM 11/28/2017    1:01 PM 08/18/2017   12:32 AM  Advanced Directives  Does Patient Have a Medical Advance Directive? Yes Yes Yes Yes Yes Yes No  Type of AParamedicof AWagenerLiving will HExcelsior EstatesLiving will HBaratariaLiving will  HSparksLiving will HSchertzLiving will   Does patient want to make changes to medical advance directive? No - Patient declined  No - Patient declined   No - Patient declined   Copy of [place]airyin Chart? Yes - validated most recent copy scanned in chart (See row information) No - copy requested Yes - validated most recent copy scanned in chart (See row information)  No - copy requested No - copy requested   Would patient like information on creating a medical advance directive?       No - Patient declined    Current Medications (verified) Outpatient Encounter Medications as of 08/14/2022  Medication Sig   donepezil (ARICEPT) 10 MG tablet TAKE 1 TABLET BY MOUTH EVERYDAY AT BEDTIME   ELIQUIS 2.5 MG TABS tablet TAKE 1 TABLET BY MOUTH TWICE A DAY   hydrochlorothiazide (HYDRODIURIL) 12.5 MG tablet TAKE 1 TABLET BY MOUTH EVERY DAY   KLOR-CON M20 20 MEQ tablet TAKE 1 TABLET BY MOUTH DAILY. NEEDS OFFICE VISIT FOR FURTHER REFILLS CALL [phone]   metoprolol tartrate (LOPRESSOR) 25 MG tablet TAKE 1 TABLET BY MOUTH TWICE A DAY   Multiple Vitamin (MULTIVITAMIN WITH MINERALS) TABS tablet Take 1 tablet by mouth daily.   simvastatin (ZOCOR) 20 MG tablet TAKE 1 TABLET BY MOUTH EVERY DAY AT NIGHT TIME   No facility-administered encounter medications on file as of 08/14/2022.    Allergies (verified) Ace inhibitors and Diltiazem   History: Past Medical History:  Diagnosis Date   Alzheimer disease ([place]    Arthritis    "knees" (11/28/2017)   BPH (benign prostatic hyperplasia) 01/28/2010   Qualifier: Diagnosis of  By: TJoyce Gross    CAD (coronary artery disease)    Cognitive impairment 12/05/2016   Glaucoma, both eyes    HYPERLIPIDEMIA 01/28/2010   HYPERTENSION 01/28/2010   HYPERTROPHY PROSTATE W/UR OBST & OTH LUTS 01/28/2010   Paroxysmal atrial fibrillation ([place] 11/02/2017   Presence of permanent cardiac pacemaker 11/28/2017   Second degree Mobitz I AV block    Sick sinus syndrome ([place] 08/18/2017   Syncope    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   Past Surgical History:  Procedure Laterality Date   ANTERIOR CERVICAL DECOMP/DISCECTOMY FUSION  02/2003   /Archie Endo5/14/2012   BRAIN SURGERY     CARDIOVASCULAR STRESS TEST  08-04-2008   EF 0%   CATARACT EXTRACTION W/ INTRAOCULAR LENS  IMPLANT, BILATERAL Bilateral  CORONARY ANGIOPLASTY  01/2003   Archie Endo 05/08/2011   INGUINAL HERNIA REPAIR Right 03/2004   Archie Endo 05/08/2011   INSERT / REPLACE / REMOVE PACEMAKER  11/28/2017   KNEE ARTHROSCOPY Left 09/2011   Archie Endo 09/26/2011   LOOP RECORDER INSERTION N/A 08/20/2017   Procedure: LOOP RECORDER INSERTION;  Surgeon: Sanda Klein, MD;  [facility];  Service: Cardiovascular;  Laterality: N/A;   LOOP RECORDER REMOVAL  11/28/2017   LOOP RECORDER REMOVAL N/A 11/28/2017   Procedure: LOOP RECORDER REMOVAL;  Surgeon: Sanda Klein, MD;  Location: [facility];  Service: Cardiovascular;  Laterality: N/A;    PACEMAKER IMPLANT N/A 11/28/2017   Procedure: PACEMAKER IMPLANT - DUAL CHAMBER;  Surgeon: Sanda Klein, MD;  Location: [facility];  Service: Cardiovascular;  Laterality: N/A;   PROSTATE BIOPSY  03/2008   US ECHOCARDIOGRAPHY  08-04-2008   Est EF 55-60%   Family History  Problem Relation Age of Onset   Heart disease Mother    Heart disease Father    Aneurysm Brother    Social History   Socioeconomic History   Marital status: Widowed    Spouse name: Not on file   Number of children: 3   Years of education: Not on file   Highest education level: Not on file  Occupational History    Employer: [employer]  Tobacco Use   Smoking status: Former    Packs/day: 1.00    Years: 38.00    Total pack years: 38.00    Types: Cigarettes    Quit date: 12/25/1978    Years since quitting: 43.6   Smokeless tobacco: Never  Vaping Use   Vaping Use: Never used  Substance and Sexual Activity   Alcohol use: No    Comment: does not    Drug use: No   Sexual activity: Not on file  Other Topics Concern   Not on file  Social History Narrative   Not on file   Social Determinants of Health   Financial Resource Strain: Low Risk  (08/14/2022)   Overall Financial Resource Strain (CARDIA)    Difficulty of Paying Living Expenses: Not hard at all  Food Insecurity: No Food Insecurity (08/14/2022)   Hunger Vital Sign    Worried About Running Out of Food in the Last Year: Never true    Ran Out of Food in the Last Year: Never true  Transportation Needs: No Transportation Needs (08/14/2022)   PRAPARE - Hydrologist (Medical): No    Lack of Transportation (Non-Medical): No  Physical Activity: Inactive (08/14/2022)   Exercise Vital Sign    Days of Exercise per Week: 0 days    Minutes of Exercise per Session: 0 min  Stress: No Stress Concern Present (08/14/2022)   Drain    Feeling of Stress : Not at  all  Social Connections: Moderately Integrated (08/14/2022)   Social Connection and Isolation Panel [NHANES]    Frequency of Communication with Friends and Family: More than three times a week    Frequency of Social Gatherings with Friends and Family: More than three times a week    Attends Religious Services: More than 4 times per year    Active Member of [employer] or Organizations: Yes    Attends Archivist Meetings: More than 4 times per year    Marital Status: Widowed     Clinical Intake:  Diabetic?  No  Activities of Daily Living    08/14/2022   11:02 AM  In your present state of health, do you have any difficulty performing the following activities:  Hearing? 0  Vision? 0  Difficulty concentrating or making decisions? 0  Walking or climbing stairs? 0  Dressing or bathing? 0  Doing errands, shopping? 0  Preparing Food and eating ? N  Using the Toilet? N  In the past six months, have you accidently leaked urine? N  Do you have problems with loss of bowel control? N  Managing your Medications? N  Managing your Finances? N  Housekeeping or managing your Housekeeping? N    Patient Care Team: Eulas Post, MD as PCP - General Martinique, Peter M, MD (Cardiology)  Indicate any recent Medical Services you may have received from other than [facility] providers in the past year (date may be approximate).     Assessment:   This is a routine wellness examination for Gwendolyn.  Hearing/Vision screen Hearing Screening - Comments:: No hearing difficulty Vision Screening - Comments:: Wears glasses.  Dietary issues and exercise activities discussed: Exercise limited by: None identified   Goals Addressed               This Visit's Progress     Stay Healthy (pt-stated)         Depression Screen    08/14/2022   11:00 AM 07/22/2021    2:04 PM 07/22/2021    2:02 PM 07/22/2021    2:00 PM 07/20/2020    2:52 PM 12/26/2017    9:13 AM 10/31/2016   11:53 AM  PHQ 2/9 Scores   PHQ - 2 Score 0 0 0 0 0 0 0  PHQ- 9 Score     0      Fall Risk    08/14/2022   11:03 AM 07/22/2021    2:02 PM 07/20/2020    2:51 PM 04/27/2020   11:40 AM 11/18/2019    2:49 PM  Horace in the past year? 0 0 0 0 0  Comment     Emmi Telephone Survey: data to providers prior to load  Number falls in past yr: 0 0 0 0   Injury with Fall? 0 0 0    Risk for fall due to : No Fall Risks  Medication side effect;No Fall Risks    Follow up   Falls evaluation completed;Falls prevention discussed Education provided     FALL RISK PREVENTION PERTAINING TO THE HOME:  Any stairs in or around the home? No  If so, are there any without handrails? No  Home free of loose throw rugs in walkways, pet beds, electrical cords, etc? Yes  Adequate lighting in your home to reduce risk of falls? Yes   ASSISTIVE DEVICES UTILIZED TO PREVENT FALLS:  Life alert? No  Use of a cane, walker or w/c? No  Grab bars in the bathroom? No  Shower chair or bench in shower? No  Elevated toilet seat or a handicapped toilet? No   TIMED UP AND GO:  Was the test performed? Yes .  Length of time to ambulate 10 feet: 10 sec.   Gait steady and fast without use of assistive device  Cognitive Function:    12/26/2017   10:38 AM  MMSE - Mini Mental State Exam  Orientation to time 2  Orientation to time comments missed day, year and date  Orientation to Place 3  Orientation to Place-comments could not think of  Dr.  Elease Hashimoto name, did not know county  Registration 3  Attention/ Calculation 3  Recall 0  Language- name 2 objects 2  Language- repeat 1  Language- follow 3 step command 3  Language- read & follow direction 1  Write a sentence 1  Copy design 1  Total score 20        08/14/2022   11:04 AM 07/20/2020    2:53 PM  6CIT Screen  What Year? 4 points 0 points  What month? 3 points 0 points  What time? 3 points 0 points  Count back from 20 0 points 0 points  Months in reverse 0 points 2 points   Repeat phrase 2 points 10 points  Total Score 12 points 12 points    Immunizations Immunization History  Administered Date(s) Administered   Fluad Quad(high Dose 65+) 09/30/2019, 10/14/2021   Influenza Split 09/19/2012   Influenza, High Dose Seasonal PF 09/20/2018   Influenza, Seasonal, Injecte, Preservative Fre 09/29/2013, 09/30/2014   Influenza,inj,Quad PF,6+ Mos 09/09/2015   Influenza-Unspecified 09/17/2016, 10/01/2017, 10/14/2021   PFIZER(Purple Top)SARS-COV-2 Vaccination 12/09/2020, 12/15/2021   Pfizer Covid-19 Vaccine Bivalent Booster 1yr & up 12/15/2021   Pneumococcal Conjugate-13 12/05/2016   Pneumococcal Polysaccharide-23 06/13/2012   Tdap 08/18/2017   Zoster Recombinat (Shingrix) 10/14/2021   Zoster, Live 07/15/2012    TDAP status: Up to date  Flu Vaccine status: Up to date  Pneumococcal vaccine status: Up to date  Covid-19 vaccine status: Completed vaccines  Qualifies for Shingles Vaccine? Yes   Zostavax completed No   Shingrix Completed?: No.    Education has been provided regarding the importance of this vaccine. Patient has been advised to call insurance company to determine out of pocket expense if they have not yet received this vaccine. Advised may also receive vaccine at local pharmacy or Health Dept. Verbalized acceptance and understanding.  Screening Tests Health Maintenance  Topic Date Due   INFLUENZA VACCINE  07/25/2022   COVID-19 Vaccine (3 - Pfizer risk series) 08/30/2022 (Originally 01/12/2022)   Zoster Vaccines- Shingrix (2 of 2) 11/14/2022 (Originally 12/09/2021)   TETANUS/TDAP  08/19/2027   Pneumonia Vaccine 88 Years old  Completed   HPV VACCINES  Aged Out    Health Maintenance  Health Maintenance Due  Topic Date Due   INFLUENZA VACCINE  07/25/2022    Colorectal cancer screening: No longer required.   Lung Cancer Screening: (Low Dose CT Chest recommended if Age 86-80years, 30 pack-year currently smoking OR have quit w/in 15years.)  does not qualify.     Additional Screening:  Hepatitis C Screening: does not qualify; Completed   Vision Screening: Recommended annual ophthalmology exams for early detection of glaucoma and other disorders of the eye. Is the patient up to date with their annual eye exam?  No  Who is the provider or what is the name of the office in which the patient attends annual eye exams? Patient deferred If pt is not established with a provider, would they like to be referred to a provider to establish care? No .   Dental Screening: Recommended annual dental exams for proper oral hygiene  Community Resource Referral / Chronic Care Management:  CRR required this visit?  No   CCM required this visit?  No      Plan:     I have personally reviewed and noted the following in the patient's chart:   Medical and social history Use of alcohol, tobacco or illicit drugs  Current medications and supplements including opioid  prescriptions. Patient is not currently taking opioid prescriptions. Functional ability and status Nutritional status Physical activity Advanced directives List of other physicians Hospitalizations, surgeries, and ER visits in previous 12 months Vitals Screenings to include cognitive, depression, and falls Referrals and appointments  In addition, I have reviewed and discussed with patient certain preventive protocols, quality metrics, and best practice recommendations. A written personalized care plan for preventive services as well as general preventive health recommendations were provided to patient.     Criselda Peaches, LPN   [id number]   Nurse Notes: None

## 2022-08-14 NOTE — Patient Instructions (Addendum)
Mr. Iannello , Thank you for taking time to come for your Medicare Wellness Visit. I appreciate your ongoing commitment to your health goals. Please review the following plan we discussed and let me know if I can assist you in the future.   These are the goals we discussed:  Goals       Patient Stated      Stay active and keep playing cards       Stay Healthy (pt-stated)        This is a list of the screening recommended for you and due dates:  Health Maintenance  Topic Date Due   Flu Shot  07/25/2022   COVID-19 Vaccine (3 - Pfizer risk series) 08/30/2022*   Zoster (Shingles) Vaccine (2 of 2) 11/14/2022*   Tetanus Vaccine  08/19/2027   Pneumonia Vaccine  Completed   HPV Vaccine  Aged Out  *Topic was postponed. The date shown is not the original due date.    Advanced directives: Yes  Conditions/risks identified: None  Next appointment: Follow up in one year for your annual wellness visit.    Preventive Care 1 Years and Older, Male Preventive care refers to lifestyle choices and visits with your health care provider that can promote health and wellness. What does preventive care include? A yearly physical exam. This is also called an annual well check. Dental exams once or twice a year. Routine eye exams. Ask your health care provider how often you should have your eyes checked. Personal lifestyle choices, including: Daily care of your teeth and gums. Regular physical activity. Eating a healthy diet. Avoiding tobacco and drug use. Limiting alcohol use. Practicing safe sex. Taking low doses of aspirin every day. Taking vitamin and mineral supplements as recommended by your health care provider. What happens during an annual well check? The services and screenings done by your health care provider during your annual well check will depend on your age, overall health, lifestyle risk factors, and family history of disease. Counseling  Your health care provider may ask  you questions about your: Alcohol use. Tobacco use. Drug use. Emotional well-being. Home and relationship well-being. Sexual activity. Eating habits. History of falls. Memory and ability to understand (cognition). Work and work Statistician. Screening  You may have the following tests or measurements: Height, weight, and BMI. Blood pressure. Lipid and cholesterol levels. These may be checked every 5 years, or more frequently if you are over 54 years old. Skin check. Lung cancer screening. You may have this screening every year starting at age 60 if you have a 30-pack-year history of smoking and currently smoke or have quit within the past 15 years. Fecal occult blood test (FOBT) of the stool. You may have this test every year starting at age 43. Flexible sigmoidoscopy or colonoscopy. You may have a sigmoidoscopy every 5 years or a colonoscopy every 10 years starting at age 45. Prostate cancer screening. Recommendations will vary depending on your family history and other risks. Hepatitis C blood test. Hepatitis B blood test. Sexually transmitted disease (STD) testing. Diabetes screening. This is done by checking your blood sugar (glucose) after you have not eaten for a while (fasting). You may have this done every 1-3 years. Abdominal aortic aneurysm (AAA) screening. You may need this if you are a current or former smoker. Osteoporosis. You may be screened starting at age 65 if you are at high risk. Talk with your health care provider about your test results, treatment options, and if necessary, the  need for more tests. Vaccines  Your health care provider may recommend certain vaccines, such as: Influenza vaccine. This is recommended every year. Tetanus, diphtheria, and acellular pertussis (Tdap, Td) vaccine. You may need a Td booster every 10 years. Zoster vaccine. You may need this after age 7. Pneumococcal 13-valent conjugate (PCV13) vaccine. One dose is recommended after age  18. Pneumococcal polysaccharide (PPSV23) vaccine. One dose is recommended after age 61. Talk to your health care provider about which screenings and vaccines you need and how often you need them. This information is not intended to replace advice given to you by your health care provider. Make sure you discuss any questions you have with your health care provider. Document Released: 01/07/2016 Document Revised: 08/30/2016 Document Reviewed: 10/12/2015 Elsevier Interactive Patient Education  2017 [place] Prevention in the Home Falls can cause injuries. They can happen to people of all ages. There are many things you can do to make your home safe and to help prevent falls. What can I do on the outside of my home? Regularly fix the edges of walkways and driveways and fix any cracks. Remove anything that might make you trip as you walk through a door, such as a raised step or threshold. Trim any bushes or trees on the path to your home. Use bright outdoor lighting. Clear any walking paths of anything that might make someone trip, such as rocks or tools. Regularly check to see if handrails are loose or broken. Make sure that both sides of any steps have handrails. Any raised decks and porches should have guardrails on the edges. Have any leaves, snow, or ice cleared regularly. Use sand or salt on walking paths during winter. Clean up any spills in your garage right away. This includes oil or grease spills. What can I do in the bathroom? Use night lights. Install grab bars by the toilet and in the tub and shower. Do not use towel bars as grab bars. Use non-skid mats or decals in the tub or shower. If you need to sit down in the shower, use a plastic, non-slip stool. Keep the floor dry. Clean up any water that spills on the floor as soon as it happens. Remove soap buildup in the tub or shower regularly. Attach bath mats securely with double-sided non-slip rug tape. Do not have throw  rugs and other things on the floor that can make you trip. What can I do in the bedroom? Use night lights. Make sure that you have a light by your bed that is easy to reach. Do not use any sheets or blankets that are too big for your bed. They should not hang down onto the floor. Have a firm chair that has side arms. You can use this for support while you get dressed. Do not have throw rugs and other things on the floor that can make you trip. What can I do in the kitchen? Clean up any spills right away. Avoid walking on wet floors. Keep items that you use a lot in easy-to-reach places. If you need to reach something above you, use a strong step stool that has a grab bar. Keep electrical cords out of the way. Do not use floor polish or wax that makes floors slippery. If you must use wax, use non-skid floor wax. Do not have throw rugs and other things on the floor that can make you trip. What can I do with my stairs? Do not leave any items on the  stairs. Make sure that there are handrails on both sides of the stairs and use them. Fix handrails that are broken or loose. Make sure that handrails are as long as the stairways. Check any carpeting to make sure that it is firmly attached to the stairs. Fix any carpet that is loose or worn. Avoid having throw rugs at the top or bottom of the stairs. If you do have throw rugs, attach them to the floor with carpet tape. Make sure that you have a light switch at the top of the stairs and the bottom of the stairs. If you do not have them, ask someone to add them for you. What else can I do to help prevent falls? Wear shoes that: Do not have high heels. Have rubber bottoms. Are comfortable and fit you well. Are closed at the toe. Do not wear sandals. If you use a stepladder: Make sure that it is fully opened. Do not climb a closed stepladder. Make sure that both sides of the stepladder are locked into place. Ask someone to hold it for you, if  possible. Clearly mark and make sure that you can see: Any grab bars or handrails. First and last steps. Where the edge of each step is. Use tools that help you move around (mobility aids) if they are needed. These include: Canes. Walkers. Scooters. Crutches. Turn on the lights when you go into a dark area. Replace any light bulbs as soon as they burn out. Set up your furniture so you have a clear path. Avoid moving your furniture around. If any of your floors are uneven, fix them. If there are any pets around you, be aware of where they are. Review your medicines with your doctor. Some medicines can make you feel dizzy. This can increase your chance of falling. Ask your doctor what other things that you can do to help prevent falls. This information is not intended to replace advice given to you by your health care provider. Make sure you discuss any questions you have with your health care provider. Document Released: 10/07/2009 Document Revised: 05/18/2016 Document Reviewed: 01/15/2015 Elsevier Interactive Patient Education  2017 [employer].

## 2022-09-05 DIAGNOSIS — Z23 Encounter for immunization: Secondary | ICD-10-CM | POA: Diagnosis not present

## 2022-09-06 ENCOUNTER — Ambulatory Visit (INDEPENDENT_AMBULATORY_CARE_PROVIDER_SITE_OTHER): Payer: Medicare Other

## 2022-09-06 DIAGNOSIS — I495 Sick sinus syndrome: Secondary | ICD-10-CM

## 2022-09-07 LAB — CUP PACEART REMOTE DEVICE CHECK
Battery Remaining Longevity: 71 mo
Battery Voltage: 2.97 V
Brady Statistic AP VP Percent: 71.6 %
Brady Statistic AP VS Percent: 20 %
Brady Statistic AS VP Percent: 2.08 %
Brady Statistic AS VS Percent: 6.32 %
Brady Statistic RA Percent Paced: 91.42 %
Brady Statistic RV Percent Paced: 73.68 %
Date Time Interrogation Session: 20230913003900
Implantable Lead Implant Date: 20181205
Implantable Lead Implant Date: 20181205
Implantable Lead Location: 753859
Implantable Lead Location: 753860
Implantable Lead Model: 5076
Implantable Lead Model: 5076
Implantable Pulse Generator Implant Date: 20181205
Lead Channel Impedance Value: 247 Ohm
Lead Channel Impedance Value: 247 Ohm
Lead Channel Impedance Value: 323 Ohm
Lead Channel Impedance Value: 399 Ohm
Lead Channel Pacing Threshold Amplitude: 0.625 V
Lead Channel Pacing Threshold Amplitude: 1 V
Lead Channel Pacing Threshold Pulse Width: 0.4 ms
Lead Channel Pacing Threshold Pulse Width: 0.4 ms
Lead Channel Sensing Intrinsic Amplitude: 1.125 mV
Lead Channel Sensing Intrinsic Amplitude: 1.125 mV
Lead Channel Sensing Intrinsic Amplitude: 6 mV
Lead Channel Sensing Intrinsic Amplitude: 6 mV
Lead Channel Setting Pacing Amplitude: 1.5 V
Lead Channel Setting Pacing Amplitude: 2.5 V
Lead Channel Setting Pacing Pulse Width: 0.4 ms
Lead Channel Setting Sensing Sensitivity: 0.9 mV

## 2022-09-21 NOTE — Progress Notes (Signed)
Remote pacemaker transmission.   

## 2022-09-25 DIAGNOSIS — Z23 Encounter for immunization: Secondary | ICD-10-CM | POA: Diagnosis not present

## 2022-10-29 ENCOUNTER — Other Ambulatory Visit: Payer: Self-pay | Admitting: Family Medicine

## 2022-10-29 ENCOUNTER — Other Ambulatory Visit: Payer: Self-pay | Admitting: Cardiovascular Disease

## 2022-12-06 ENCOUNTER — Ambulatory Visit (INDEPENDENT_AMBULATORY_CARE_PROVIDER_SITE_OTHER): Payer: Medicare Other

## 2022-12-06 ENCOUNTER — Telehealth: Payer: Self-pay

## 2022-12-06 DIAGNOSIS — I495 Sick sinus syndrome: Secondary | ICD-10-CM

## 2022-12-06 LAB — CUP PACEART REMOTE DEVICE CHECK
Battery Remaining Longevity: 67 mo
Battery Voltage: 2.97 V
Brady Statistic AP VP Percent: 68.06 %
Brady Statistic AP VS Percent: 21.26 %
Brady Statistic AS VP Percent: 3.27 %
Brady Statistic AS VS Percent: 7.41 %
Brady Statistic RA Percent Paced: 89.04 %
Brady Statistic RV Percent Paced: 71.33 %
Date Time Interrogation Session: 20231212235411
Implantable Lead Connection Status: 753985
Implantable Lead Connection Status: 753985
Implantable Lead Implant Date: 20181205
Implantable Lead Implant Date: 20181205
Implantable Lead Location: 753859
Implantable Lead Location: 753860
Implantable Lead Model: 5076
Implantable Lead Model: 5076
Implantable Pulse Generator Implant Date: 20181205
Lead Channel Impedance Value: 247 Ohm
Lead Channel Impedance Value: 266 Ohm
Lead Channel Impedance Value: 342 Ohm
Lead Channel Impedance Value: 399 Ohm
Lead Channel Pacing Threshold Amplitude: 0.5 V
Lead Channel Pacing Threshold Amplitude: 1.125 V
Lead Channel Pacing Threshold Pulse Width: 0.4 ms
Lead Channel Pacing Threshold Pulse Width: 0.4 ms
Lead Channel Sensing Intrinsic Amplitude: 1.5 mV
Lead Channel Sensing Intrinsic Amplitude: 1.5 mV
Lead Channel Sensing Intrinsic Amplitude: 6.5 mV
Lead Channel Sensing Intrinsic Amplitude: 6.5 mV
Lead Channel Setting Pacing Amplitude: 1.5 V
Lead Channel Setting Pacing Amplitude: 2.5 V
Lead Channel Setting Pacing Pulse Width: 0.4 ms
Lead Channel Setting Sensing Sensitivity: 0.9 mV
Zone Setting Status: 755011
Zone Setting Status: 755011

## 2022-12-06 NOTE — Telephone Encounter (Signed)
Pt son states he fell Monday morning around 9:30-10 am. I let him speak with Leigh, rn.

## 2022-12-06 NOTE — Telephone Encounter (Signed)
Patients son called and reports patient fell monday am 12/04/22. Does not have information in regard to fall.   Remote transmission received and no episodes noted for Monday 12/04/22 in am time.   Recommended patient following up with PCP in regard to call. Son voiced understanding and agreeable to plan.

## 2022-12-10 ENCOUNTER — Other Ambulatory Visit: Payer: Self-pay | Admitting: Family Medicine

## 2022-12-10 ENCOUNTER — Other Ambulatory Visit: Payer: Self-pay | Admitting: Cardiovascular Disease

## 2023-01-02 NOTE — Progress Notes (Signed)
Remote pacemaker transmission.   

## 2023-01-08 ENCOUNTER — Other Ambulatory Visit: Payer: Self-pay | Admitting: Family Medicine

## 2023-01-09 ENCOUNTER — Encounter: Payer: Self-pay | Admitting: Family Medicine

## 2023-01-09 ENCOUNTER — Ambulatory Visit (INDEPENDENT_AMBULATORY_CARE_PROVIDER_SITE_OTHER): Payer: Medicare Other | Admitting: Family Medicine

## 2023-01-09 VITALS — BP 156/68 | HR 72 | Temp 97.5°F | Ht 64.0 in | Wt 133.6 lb

## 2023-01-09 DIAGNOSIS — Z79899 Other long term (current) drug therapy: Secondary | ICD-10-CM

## 2023-01-09 DIAGNOSIS — E7800 Pure hypercholesterolemia, unspecified: Secondary | ICD-10-CM | POA: Diagnosis not present

## 2023-01-09 DIAGNOSIS — I10 Essential (primary) hypertension: Secondary | ICD-10-CM

## 2023-01-09 DIAGNOSIS — I480 Paroxysmal atrial fibrillation: Secondary | ICD-10-CM | POA: Diagnosis not present

## 2023-01-09 LAB — LIPID PANEL
Cholesterol: 109 mg/dL (ref 0–200)
HDL: 34.5 mg/dL — ABNORMAL LOW (ref >39.00)
LDL Cholesterol: 62 mg/dL (ref 0–99)
NonHDL: 74.7
Total CHOL/HDL Ratio: 3
Triglycerides: 66 mg/dL (ref 0.0–149.0)
VLDL: 13.2 mg/dL (ref 0.0–40.0)

## 2023-01-09 LAB — CBC WITH DIFFERENTIAL/PLATELET
Basophils Absolute: 0 10*3/uL (ref 0.0–0.1)
Basophils Relative: 0.8 % (ref 0.0–3.0)
Eosinophils Absolute: 0.1 10*3/uL (ref 0.0–0.7)
Eosinophils Relative: 1 % (ref 0.0–5.0)
HCT: 27.3 % — ABNORMAL LOW (ref 39.0–52.0)
Hemoglobin: 9.2 g/dL — ABNORMAL LOW (ref 13.0–17.0)
Lymphocytes Relative: 20.4 % (ref 12.0–46.0)
Lymphs Abs: 1.3 10*3/uL (ref 0.7–4.0)
MCHC: 33.7 g/dL (ref 30.0–36.0)
MCV: 87.1 fl (ref 78.0–100.0)
Monocytes Absolute: 1 10*3/uL (ref 0.1–1.0)
Monocytes Relative: 16.4 % — ABNORMAL HIGH (ref 3.0–12.0)
Neutro Abs: 3.8 10*3/uL (ref 1.4–7.7)
Neutrophils Relative %: 61.4 % (ref 43.0–77.0)
Platelets: 117 10*3/uL — ABNORMAL LOW (ref 150.0–400.0)
RBC: 3.14 Mil/uL — ABNORMAL LOW (ref 4.22–5.81)
RDW: 15.1 % (ref 11.5–15.5)
WBC: 6.3 10*3/uL (ref 4.0–10.5)

## 2023-01-09 LAB — HEPATIC FUNCTION PANEL
ALT: 9 U/L (ref 0–53)
AST: 16 U/L (ref 0–37)
Albumin: 4.5 g/dL (ref 3.5–5.2)
Alkaline Phosphatase: 63 U/L (ref 39–117)
Bilirubin, Direct: 0.1 mg/dL (ref 0.0–0.3)
Total Bilirubin: 0.5 mg/dL (ref 0.2–1.2)
Total Protein: 7.7 g/dL (ref 6.0–8.3)

## 2023-01-09 LAB — BASIC METABOLIC PANEL WITH GFR
BUN: 13 mg/dL (ref 6–23)
CO2: 30 meq/L (ref 19–32)
Calcium: 9.9 mg/dL (ref 8.4–10.5)
Chloride: 103 meq/L (ref 96–112)
Creatinine, Ser: 1.53 mg/dL — ABNORMAL HIGH (ref 0.40–1.50)
GFR: 38.3 mL/min — ABNORMAL LOW
Glucose, Bld: 122 mg/dL — ABNORMAL HIGH (ref 70–99)
Potassium: 4.6 meq/L (ref 3.5–5.1)
Sodium: 142 meq/L (ref 135–145)

## 2023-01-09 MED ORDER — POTASSIUM CHLORIDE CRYS ER 20 MEQ PO TBCR: EXTENDED_RELEASE_TABLET | ORAL | 3 refills | Status: DC

## 2023-01-09 NOTE — Patient Instructions (Signed)
Set up follow up in about one month and bring in your cuff to compare with ours at follow up.Marland Kitchen

## 2023-01-09 NOTE — Progress Notes (Signed)
Established Patient Office Visit  Subjective   Patient ID: Joshua Burns, male    DOB: [birth year]  Age: 89 y.o. MRN: [id number]  Chief Complaint  Patient presents with   Follow-up    HPI   Joshua Burns is seen today for medical follow-up.  He is almost 89 years old.  Very supportive family and daughter is with him today.  They have no specific complaints.  He has good appetite.  No recent falls.  His chronic problems include hypertension, history of CAD, atrial fibrillation, history of second-degree Mobitz 1 AV block, sick sinus syndrome, BPH, hyperlipidemia, glaucoma, cognitive impairment.  Current medications include Aricept, potassium, HCTZ, simvastatin, Eliquis, and metoprolol.  He had previous intolerance of ACE inhibitors and apparently diltiazem.  Home blood pressures have been consistently around 993 systolic and diastolics 71I to 96V.  He denies any recent chest pains or dizziness.  No recent peripheral edema.  Past Medical History:  Diagnosis Date   Alzheimer disease ([place])    Arthritis    "knees" (11/28/2017)   BPH (benign prostatic hyperplasia) 01/28/2010   Qualifier: Diagnosis of  By: Joyce Gross     CAD (coronary artery disease)    Cognitive impairment 12/05/2016   Glaucoma, both eyes    HYPERLIPIDEMIA 01/28/2010   HYPERTENSION 01/28/2010   HYPERTROPHY PROSTATE W/UR OBST & OTH LUTS 01/28/2010   Paroxysmal atrial fibrillation ([place]) 11/02/2017   Presence of permanent cardiac pacemaker 11/28/2017   Second degree Mobitz I AV block    Sick sinus syndrome ([place]) 08/18/2017   Syncope    Tachycardia-bradycardia syndrome ([place]) 11/28/2017   Past Surgical History:  Procedure Laterality Date   ANTERIOR CERVICAL DECOMP/DISCECTOMY FUSION  02/2003   Archie Endo 05/08/2011   BRAIN SURGERY     CARDIOVASCULAR STRESS TEST  08-04-2008   EF 0%   CATARACT EXTRACTION W/ INTRAOCULAR LENS  IMPLANT, BILATERAL Bilateral    CORONARY ANGIOPLASTY  01/2003   Archie Endo 05/08/2011   INGUINAL HERNIA REPAIR  Right 03/2004   Archie Endo 05/08/2011   INSERT / REPLACE / REMOVE PACEMAKER  11/28/2017   KNEE ARTHROSCOPY Left 09/2011   Archie Endo 09/26/2011   LOOP RECORDER INSERTION N/A 08/20/2017   Procedure: LOOP RECORDER INSERTION;  Surgeon: Sanda Klein, MD;  Location: [facility];  Service: Cardiovascular;  Laterality: N/A;   LOOP RECORDER REMOVAL  11/28/2017   LOOP RECORDER REMOVAL N/A 11/28/2017   Procedure: LOOP RECORDER REMOVAL;  Surgeon: Sanda Klein, MD;  Location: [facility];  Service: Cardiovascular;  Laterality: N/A;   PACEMAKER IMPLANT N/A 11/28/2017   Procedure: PACEMAKER IMPLANT - DUAL CHAMBER;  Surgeon: Sanda Klein, MD;  Location: [facility];  Service: Cardiovascular;  Laterality: N/A;   PROSTATE BIOPSY  03/2008   US ECHOCARDIOGRAPHY  08-04-2008   Est EF 55-60%    reports that he quit smoking about 44 years ago. His smoking use included cigarettes. He has a 38.00 pack-year smoking history. He has never used smokeless tobacco. He reports that he does not drink alcohol and does not use drugs. family history includes Aneurysm in his brother; Heart disease in his father and mother. Allergies  Allergen Reactions   Ace Inhibitors Swelling   Diltiazem Swelling and Palpitations    Review of Systems  Constitutional:  Negative for malaise/fatigue.  Eyes:  Negative for blurred vision.  Respiratory:  Negative for shortness of breath.   Cardiovascular:  Negative for chest pain.  Gastrointestinal:  Negative for abdominal pain.  Genitourinary:  Negative for  dysuria.  Neurological:  Negative for dizziness, weakness and headaches.      Objective:     BP (!) 156/68 (BP Location: Left Arm, Cuff Size: Normal)   Pulse 72   Temp (!) 97.5 F (36.4 C) (Oral)   Ht '5\' 4"'$  (1.626 m)   Wt 133 lb 9.6 oz (60.6 kg)   SpO2 99%   BMI 22.93 kg/m  BP Readings from Last 3 Encounters:  01/09/23 (!) 156/68  08/14/22 134/60  10/10/21 122/79   Wt Readings from Last 3 Encounters:   01/09/23 133 lb 9.6 oz (60.6 kg)  08/14/22 132 lb (59.9 kg)  10/10/21 135 lb 6.4 oz (61.4 kg)      Physical Exam Vitals reviewed.  Constitutional:      General: He is not in acute distress.    Appearance: Normal appearance.  Cardiovascular:     Rate and Rhythm: Normal rate and regular rhythm.  Pulmonary:     Effort: Pulmonary effort is normal.     Breath sounds: Normal breath sounds. No wheezing or rales.  Musculoskeletal:     Right lower leg: No edema.     Left lower leg: No edema.  Neurological:     Mental Status: He is alert.      No results found for any visits on 01/09/23.    The ASCVD Risk score (Arnett DK, et al., 2019) failed to calculate for the following reasons:   The 2019 ASCVD risk score is only valid for ages 87 to 87    Assessment & Plan:   Problem List Items Addressed This Visit       Unprioritized   Paroxysmal atrial fibrillation (HCC)   Hyperlipidemia   Relevant Orders   Lipid panel   Hepatic function panel   Essential hypertension - Primary   Relevant Orders   Basic metabolic panel   Other Visit Diagnoses     High risk medication use       Relevant Orders   CBC with Differential/Platelet     -Needs follow-up labs and will go ahead with CBC, basic metabolic panel, lipid panel, hepatic panel -Refill K-Lor for 1 year -Watch sodium intake closely and set up 1 month follow-up and bring their cuff in for comparison with ours. -Flu vaccine already given -He has follow-up scheduled with cardiology next month  Return in about 1 month (around 02/09/2023).    Carolann Littler, MD

## 2023-02-01 ENCOUNTER — Ambulatory Visit (INDEPENDENT_AMBULATORY_CARE_PROVIDER_SITE_OTHER): Payer: Medicare Other | Admitting: Cardiovascular Disease

## 2023-02-01 ENCOUNTER — Encounter: Payer: Self-pay | Admitting: Cardiovascular Disease

## 2023-02-01 ENCOUNTER — Other Ambulatory Visit: Payer: Self-pay

## 2023-02-01 ENCOUNTER — Emergency Department (HOSPITAL_BASED_OUTPATIENT_CLINIC_OR_DEPARTMENT_OTHER)
Admission: EM | Admit: 2023-02-01 | Discharge: 2023-02-02 | Disposition: A | Discharge status: Home / Self Care | Payer: Medicare Other | Attending: Emergency Medicine | Admitting: Emergency Medicine

## 2023-02-01 ENCOUNTER — Encounter (HOSPITAL_BASED_OUTPATIENT_CLINIC_OR_DEPARTMENT_OTHER): Payer: Self-pay | Admitting: Emergency Medicine

## 2023-02-01 VITALS — BP 164/74 | HR 70 | Ht 65.0 in | Wt 133.8 lb

## 2023-02-01 DIAGNOSIS — D696 Thrombocytopenia, unspecified: Secondary | ICD-10-CM | POA: Insufficient documentation

## 2023-02-01 DIAGNOSIS — I4729 Other ventricular tachycardia: Secondary | ICD-10-CM | POA: Insufficient documentation

## 2023-02-01 DIAGNOSIS — I495 Sick sinus syndrome: Secondary | ICD-10-CM | POA: Diagnosis not present

## 2023-02-01 DIAGNOSIS — Z950 Presence of cardiac pacemaker: Secondary | ICD-10-CM | POA: Insufficient documentation

## 2023-02-01 DIAGNOSIS — I129 Hypertensive chronic kidney disease with stage 1 through stage 4 chronic kidney disease, or unspecified chronic kidney disease: Secondary | ICD-10-CM | POA: Insufficient documentation

## 2023-02-01 DIAGNOSIS — D6869 Other thrombophilia: Secondary | ICD-10-CM

## 2023-02-01 DIAGNOSIS — N189 Chronic kidney disease, unspecified: Secondary | ICD-10-CM | POA: Diagnosis not present

## 2023-02-01 DIAGNOSIS — Z7901 Long term (current) use of anticoagulants: Secondary | ICD-10-CM | POA: Insufficient documentation

## 2023-02-01 DIAGNOSIS — I959 Hypotension, unspecified: Secondary | ICD-10-CM | POA: Diagnosis not present

## 2023-02-01 DIAGNOSIS — M545 Low back pain, unspecified: Secondary | ICD-10-CM | POA: Diagnosis not present

## 2023-02-01 DIAGNOSIS — E785 Hyperlipidemia, unspecified: Secondary | ICD-10-CM | POA: Diagnosis not present

## 2023-02-01 DIAGNOSIS — M5441 Lumbago with sciatica, right side: Secondary | ICD-10-CM

## 2023-02-01 DIAGNOSIS — I10 Essential (primary) hypertension: Secondary | ICD-10-CM

## 2023-02-01 DIAGNOSIS — M549 Dorsalgia, unspecified: Secondary | ICD-10-CM | POA: Diagnosis not present

## 2023-02-01 DIAGNOSIS — I4719 Other supraventricular tachycardia: Secondary | ICD-10-CM | POA: Diagnosis not present

## 2023-02-01 DIAGNOSIS — I441 Atrioventricular block, second degree: Secondary | ICD-10-CM

## 2023-02-01 DIAGNOSIS — D649 Anemia, unspecified: Secondary | ICD-10-CM

## 2023-02-01 NOTE — Progress Notes (Signed)
Cardiology Office Note:    Date:  02/01/2023   ID:  Joshua Burns, DOB Dec 20, 1925, MRN [id number]  PCP:  Joshua Post, MD  Cardiologist:  Joshua Klein, MD    Referring MD: Joshua Post, MD   Chief Complaint  Patient presents with   Pacemaker Check   Irregular Heart Beat     History of Present Illness:    Joshua Burns is a 89 y.o. male with a hx of syncope due to sinus pauses,  paroxysmal atrial fibrillation with rapid ventricular response s/p dual-chamber permanent pacemaker implantation in December 2018 ([employer] Azure MRI conditional).  He has not had any syncope since pacemaker implantation.  He has never had stroke/TIA or other embolic events.    He is accompanied today by his son, they live in the same household.  He has been feeling quite well. The patient specifically denies any chest pain at rest exertion, dyspnea at rest or with exertion, orthopnea, paroxysmal nocturnal dyspnea, syncope, palpitations, focal neurological deficits, intermittent claudication, lower extremity edema, unexplained weight gain, cough, hemoptysis or wheezing.  He has not had any falls or any overt bleeding problems.  Pacemaker interrogation shows normal device function.  Estimated generator longevity is over 5 years.  He has 90% atrial pacing and 70% ventricular pacing.  He has rare and brief episodes of nonsustained ventricular tachycardia.  He has frequent, often daily episodes of paroxysmal atrial tachycardia which are sometimes sustained, but rarely last more than a minute.  The overall burden of atrial arrhythmia is less than 1%.  Interestingly, most of them do not look like atrial fibrillation, but rather ectopic atrial tachycardia.  Some episodes suggest atypical atrial flutter.  Activity level is stable at approximately 1 hour/day.  The heart rate histogram distribution is appropriate.  As always, his blood pressure was a little high when checked initially, gets a little better after  he relaxes in the office for a while and is always lower when checked at home.  He has treated hypertension and hyperlipidemia.  He has normal left ventricular systolic function and had a normal stress test in the remote past.  He has progressive but still mild dementia.    He has had thrombocytopenia, without bleeding problems dates back at least to 2013.  His platelet count reached a nadir around 50,000 in 2018 and has slowly improved so that is now up to 117,000.  He is also had varying degrees of anemia for similar amount of time, but his hemoglobin seems to be steadily worsening and is now down to 9.2.  He has normocytic normochromic parameters.  Studies for nutritional deficiency in 2018 showed normal iron, folic acid, J88 levels, but have not been rechecked since.  He is on chronic anticoagulation with Eliquis, dose adjusted for age and small body habitus.  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  Past Surgical History:  Procedure Laterality Date   ANTERIOR CERVICAL DECOMP/DISCECTOMY FUSION  02/2003   Joshua Burns 05/08/2011   BRAIN SURGERY     CARDIOVASCULAR STRESS TEST  08-04-2008   EF 0%   CATARACT EXTRACTION W/ INTRAOCULAR LENS  IMPLANT, BILATERAL Bilateral    CORONARY ANGIOPLASTY  01/2003   Joshua Burns 05/08/2011   INGUINAL HERNIA REPAIR Right 03/2004   Joshua Burns 05/08/2011   INSERT / REPLACE / REMOVE  PACEMAKER  11/28/2017   KNEE ARTHROSCOPY Left 09/2011   Joshua Burns 09/26/2011   LOOP RECORDER INSERTION N/A 08/20/2017   Procedure: LOOP RECORDER INSERTION;  Surgeon: Joshua Klein, MD;  Location: [facility];  Service: Cardiovascular;  Laterality: N/A;   LOOP RECORDER REMOVAL  11/28/2017   LOOP RECORDER REMOVAL N/A 11/28/2017   Procedure: LOOP RECORDER REMOVAL;  Surgeon: Joshua Klein, MD;  Location: [facility];  Service: Cardiovascular;  Laterality: N/A;   PACEMAKER IMPLANT N/A 11/28/2017   Procedure: PACEMAKER IMPLANT - DUAL CHAMBER;  Surgeon: Joshua Klein, MD;  Location: [facility];  Service: Cardiovascular;  Laterality: N/A;   PROSTATE BIOPSY  03/2008   US ECHOCARDIOGRAPHY  08-04-2008   Est EF 55-60%    Current Medications: Current Meds  Medication Sig   donepezil (ARICEPT) 10 MG tablet TAKE 1 TABLET BY MOUTH EVERYDAY AT BEDTIME   ELIQUIS 2.5 MG TABS tablet TAKE 1 TABLET BY MOUTH TWICE A DAY   hydrochlorothiazide (HYDRODIURIL) 12.5 MG tablet TAKE 1 TABLET BY MOUTH EVERY DAY   metoprolol tartrate (LOPRESSOR) 25 MG tablet TAKE 1 TABLET BY MOUTH TWICE A DAY   Multiple Vitamin (MULTIVITAMIN WITH MINERALS) TABS tablet Take 1 tablet by mouth daily.   potassium chloride SA (KLOR-CON M20) 20 MEQ tablet TAKE 1 TABLET BY MOUTH DAILY. NEEDS OFFICE VISIT FOR FURTHER REFILLS CALL [phone]   simvastatin (ZOCOR) 20 MG tablet TAKE 1 TABLET BY MOUTH EVERY DAY AT NIGHT TIME     Allergies:   Ace inhibitors and Diltiazem   Social History   Socioeconomic History   Marital status: Widowed    Spouse name: Not on file   Number of children: 3   Years of education: Not on file   Highest education level: Not on file  Occupational History    Employer: [employer]  Tobacco Use   Smoking status: Former    Packs/day: 1.00    Years: 38.00    Total pack years: 38.00    Types: Cigarettes    Quit date: 12/25/1978    Years since quitting: 44.1   Smokeless tobacco: Never   Vaping Use   Vaping Use: Never used  Substance and Sexual Activity   Alcohol use: No    Comment: does not    Drug use: No   Sexual activity: Not on file  Other Topics Concern   Not on file  Social History Narrative   Not on file   Social Determinants of Health   Financial Resource Strain: Low Risk  (08/14/2022)   Overall Financial Resource Strain (CARDIA)    Difficulty of Paying Living Expenses: Not hard at all  Food Insecurity: No Food Insecurity (08/14/2022)   Hunger Vital Sign    Worried About Running Out of Food in the Last Year: Never true    Ran Out of Food in the Last Year: Never true  Transportation Needs: No Transportation Needs (08/14/2022)   PRAPARE - Hydrologist (Medical): No    Lack of Transportation (  Non-Medical): No  Physical Activity: Inactive (08/14/2022)   Exercise Vital Sign    Days of Exercise per Week: 0 days    Minutes of Exercise per Session: 0 min  Stress: No Stress Concern Present (08/14/2022)   [place]    Feeling of Stress : Not at all  Social Connections: Moderately Integrated (08/14/2022)   Social Connection and Isolation Panel [NHANES]    Frequency of Communication with Friends and Family: More than three times a week    Frequency of Social Gatherings with Friends and Family: More than three times a week    Attends Religious Services: More than 4 times per year    Active Member of [employer] or Organizations: Yes    Attends Archivist Meetings: More than 4 times per year    Marital Status: Widowed     Family History: The patient's family history includes Aneurysm in his brother; Heart disease in his father and mother. ROS:   Please see the history of present illness.    All other systems are reviewed and are negative  EKGs/Labs/Other Studies Reviewed:    EKG:  EKG is ordered today.  Personally reviewed, shows intermittent atrial paced/atrial  sensed rhythm, consistently ventricular sensed on current tracing, left ventricular hypertrophy with secondary ST-T changes most obvious in leads V3-V6, QTC normal at 430 ms  Recent Labs: 01/09/2023: ALT 9; BUN 13; Creatinine, Ser 1.53; Hemoglobin 9.2; Platelets 117.0; Potassium 4.6; Sodium 142  Recent Lipid Panel    Component Value Date/Time   CHOL 109 01/09/2023 1119   TRIG 66.0 01/09/2023 1119   HDL 34.50 (L) 01/09/2023 1119   CHOLHDL 3 01/09/2023 1119   VLDL 13.2 01/09/2023 1119   LDLCALC 62 01/09/2023 1119    Physical Exam:    VS:  BP (!) 164/74 (BP Location: Left Arm, Patient Position: Sitting, Cuff Size: Normal)   Pulse 70   Ht '5\' 5"'$  (1.651 m)   Wt 133 lb 12.8 oz (60.7 kg)   SpO2 96%   BMI 22.27 kg/m     Wt Readings from Last 3 Encounters:  02/01/23 133 lb 12.8 oz (60.7 kg)  01/09/23 133 lb 9.6 oz (60.6 kg)  08/14/22 132 lb (59.9 kg)      General: Alert, oriented x3, no distress, appears healthy and fit, younger than stated age, well-healed left subclavian pacemaker site. Head: no evidence of trauma, PERRL, EOMI, no exophtalmos or lid lag, no myxedema, no xanthelasma; normal ears, nose and oropharynx Neck: normal jugular venous pulsations and no hepatojugular reflux; brisk carotid pulses without delay and no carotid bruits Chest: clear to auscultation, no signs of consolidation by percussion or palpation, normal fremitus, symmetrical and full respiratory excursions Cardiovascular: normal position and quality of the apical impulse, regular rhythm, normal first and second heart sounds, no murmurs, rubs or gallops Abdomen: no tenderness or distention, no masses by palpation, no abnormal pulsatility or arterial bruits, normal bowel sounds, no hepatosplenomegaly Extremities: no clubbing, cyanosis or edema; 2+ radial, ulnar and brachial pulses bilaterally; 2+ right femoral, posterior tibial and dorsalis pedis pulses; 2+ left femoral, posterior tibial and dorsalis pedis pulses;  no subclavian or femoral bruits Neurological: grossly nonfocal Psych: Normal mood and affect    ASSESSMENT:    1. PAT (paroxysmal atrial tachycardia)   2. Acquired thrombophilia ([place])   3. Sick sinus syndrome ([place])   4. Pacemaker   5. Second degree Mobitz I AV block   6. Nonsustained ventricular  tachycardia ([place])   7. Dyslipidemia (high LDL; low HDL)   8. Essential hypertension   9. Anemia, unspecified type      PLAN:    In order of problems listed above:  PAT: Episodes are very frequent, but are also quite brief and appear to be asymptomatic.  Atrial fibrillation was suspected based on implantable loop recorder tracings, but since pacemaker implantation we have not convincing evidence of true atrial fibrillation although he may be having brief episodes of atypical atrial flutter.  CHADSVasc 3 (age 76, HTN).   Anticoagulation: No bleeding issues.  Anemia is likely not related to chronic blood loss.  I would have a low threshold for discontinuing his anticoagulation if he has any bleeding complications. SSS/Tachy-brady: Heart rate histogram distribution suggests appropriate atrial pacing sensor based rates. 2nd deg AVB: He continues to require relatively frequent ventricular pacing at 70%, fortunately without development of heart failure. PPM: Normal device function.  No changes today.  He is programmed AAIR-DDDR.  Continue remote downloads every 3 months. NSVT: rare and asymptomatic HLP: Excellent LDL, chronically low HDL.  He is quite lean. HTN: Blood pressure is always lower at home.  No changes made to his medications. Anemia: Hemoglobin is drifting downwards and erythrocyte parameters continue to be normal chronic, normocytic.  Paradoxically his thrombocytopenia has improved.  Recheck iron studies.  Consider referral to hematology.  Patient Instructions  Medication Instructions:  Your physician recommends that you continue on your current medications as directed. Please refer  to the Current Medication list given to you today.  *If you need a refill on your cardiac medications before your next appointment, please call your pharmacy*  Labs:  Today (iron studies, folate, B12)  Follow-Up: At [facility], you and your health needs are our priority.  As part of our continuing mission to provide you with exceptional heart care, we have created designated Provider Care Teams.  These Care Teams include your primary Cardiologist (physician) and Advanced Practice Providers (APPs -  Physician Assistants and Nurse Practitioners) who all work together to provide you with the care you need, when you need it.  We recommend signing up for the patient portal called "MyChart".  Sign up information is provided on this After Visit Summary.  MyChart is used to connect with patients for Virtual Visits (Telemedicine).  Patients are able to view lab/test results, encounter notes, upcoming appointments, etc.  Non-urgent messages can be sent to your provider as well.   To learn more about what you can do with MyChart, go to [email or web address].    Your next appointment:   12 month(s)  Provider:   Dr. Sallyanne Kuster    Medication Adjustments/Labs and Tests Ordered: Current medicines are reviewed at length with the patient today.  Concerns regarding medicines are outlined above.  Orders Placed This Encounter  Procedures   Iron, TIBC and Ferritin Panel   Folate   Vitamin B12   EKG 12-Lead    No orders of the defined types were placed in this encounter.    Signed, Joshua Klein, MD  02/01/2023 3:47 PM    [place]

## 2023-02-01 NOTE — Patient Instructions (Addendum)
Medication Instructions:  Your physician recommends that you continue on your current medications as directed. Please refer to the Current Medication list given to you today.  *If you need a refill on your cardiac medications before your next appointment, please call your pharmacy*  Labs:  Today (iron studies, folate, B12)  Follow-Up: At [facility], you and your health needs are our priority.  As part of our continuing mission to provide you with exceptional heart care, we have created designated Provider Care Teams.  These Care Teams include your primary Cardiologist (physician) and Advanced Practice Providers (APPs -  Physician Assistants and Nurse Practitioners) who all work together to provide you with the care you need, when you need it.  We recommend signing up for the patient portal called "MyChart".  Sign up information is provided on this After Visit Summary.  MyChart is used to connect with patients for Virtual Visits (Telemedicine).  Patients are able to view lab/test results, encounter notes, upcoming appointments, etc.  Non-urgent messages can be sent to your provider as well.   To learn more about what you can do with MyChart, go to [email or web address].    Your next appointment:   12 month(s)  Provider:   Dr. Sallyanne Kuster

## 2023-02-01 NOTE — ED Triage Notes (Signed)
  Patient BIB EMS for lower back pain that started around 2230.  Patient has hx of herniated disk and denies any new injury.  Patient was ambulating from bathroom to front porch and felt it tighten up.  Patient given 25 mcg of fentanyl via EMS and has no pain at this time.

## 2023-02-02 ENCOUNTER — Encounter: Payer: Self-pay | Admitting: Family Medicine

## 2023-02-02 ENCOUNTER — Other Ambulatory Visit: Payer: Self-pay | Admitting: *Deleted

## 2023-02-02 DIAGNOSIS — M549 Dorsalgia, unspecified: Secondary | ICD-10-CM

## 2023-02-02 DIAGNOSIS — D649 Anemia, unspecified: Secondary | ICD-10-CM

## 2023-02-02 DIAGNOSIS — M5441 Lumbago with sciatica, right side: Secondary | ICD-10-CM | POA: Diagnosis not present

## 2023-02-02 LAB — CBC WITH DIFFERENTIAL/PLATELET
Abs Immature Granulocytes: 0.04 10*3/uL (ref 0.00–0.07)
Basophils Absolute: 0 10*3/uL (ref 0.0–0.1)
Basophils Relative: 1 %
Eosinophils Absolute: 0.1 10*3/uL (ref 0.0–0.5)
Eosinophils Relative: 1 %
HCT: 26.8 % — ABNORMAL LOW (ref 39.0–52.0)
Hemoglobin: 8.6 g/dL — ABNORMAL LOW (ref 13.0–17.0)
Immature Granulocytes: 1 %
Lymphocytes Relative: 16 %
Lymphs Abs: 1 10*3/uL (ref 0.7–4.0)
MCH: 29.1 pg (ref 26.0–34.0)
MCHC: 32.1 g/dL (ref 30.0–36.0)
MCV: 90.5 fL (ref 80.0–100.0)
Monocytes Absolute: 0.7 10*3/uL (ref 0.1–1.0)
Monocytes Relative: 11 %
Neutro Abs: 4.6 10*3/uL (ref 1.7–7.7)
Neutrophils Relative %: 70 %
Platelets: 103 10*3/uL — ABNORMAL LOW (ref 150–400)
RBC: 2.96 MIL/uL — ABNORMAL LOW (ref 4.22–5.81)
RDW: 15.4 % (ref 11.5–15.5)
WBC: 6.5 10*3/uL (ref 4.0–10.5)
nRBC: 0 % (ref 0.0–0.2)

## 2023-02-02 LAB — IRON,TIBC AND FERRITIN PANEL
Ferritin: 222 ng/mL (ref 30–400)
Iron Saturation: 21 % (ref 15–55)
Iron: 51 ug/dL (ref 38–169)
Total Iron Binding Capacity: 247 ug/dL — ABNORMAL LOW (ref 250–450)
UIBC: 196 ug/dL (ref 111–343)

## 2023-02-02 LAB — COMPREHENSIVE METABOLIC PANEL
ALT: 9 U/L (ref 0–44)
AST: 15 U/L (ref 15–41)
Albumin: 4.5 g/dL (ref 3.5–5.0)
Alkaline Phosphatase: 58 U/L (ref 38–126)
Anion gap: 10 (ref 5–15)
BUN: 13 mg/dL (ref 8–23)
CO2: 28 mmol/L (ref 22–32)
Calcium: 9.7 mg/dL (ref 8.9–10.3)
Chloride: 101 mmol/L (ref 98–111)
Creatinine, Ser: 1.49 mg/dL — ABNORMAL HIGH (ref 0.61–1.24)
GFR, Estimated: 43 mL/min — ABNORMAL LOW (ref >60)
Glucose, Bld: 113 mg/dL — ABNORMAL HIGH (ref 70–99)
Potassium: 4 mmol/L (ref 3.5–5.1)
Sodium: 139 mmol/L (ref 135–145)
Total Bilirubin: 0.5 mg/dL (ref 0.3–1.2)
Total Protein: 7.7 g/dL (ref 6.5–8.1)

## 2023-02-02 LAB — FOLATE: Folate: >20 ng/mL (ref >3.0)

## 2023-02-02 LAB — VITAMIN B12: Vitamin B-12: >2000 pg/mL — ABNORMAL HIGH (ref 232–1245)

## 2023-02-02 MED ORDER — METHOCARBAMOL 500 MG PO TABS
500.0000 mg | ORAL_TABLET | Freq: Once | ORAL | Status: AC
  Administered 2023-02-02: 500 mg via ORAL
  Filled 2023-02-02: qty 1

## 2023-02-02 MED ORDER — METHOCARBAMOL 500 MG PO TABS: 500.0000 mg | ORAL_TABLET | Freq: Two times a day (BID) | ORAL | 0 refills | Status: DC

## 2023-02-02 MED ORDER — 1ST MEDX-PATCH/ LIDOCAINE 4-0.025-5-20 % EX PTCH: 1.0000 | MEDICATED_PATCH | Freq: Every day | CUTANEOUS | 0 refills | Status: AC | PRN

## 2023-02-02 MED ORDER — PREDNISONE 20 MG PO TABS: ORAL_TABLET | ORAL | 0 refills | Status: DC

## 2023-02-02 MED ORDER — OXYCODONE-ACETAMINOPHEN 5-325 MG PO TABS
1.0000 | ORAL_TABLET | Freq: Once | ORAL | Status: AC
  Administered 2023-02-02: 1 via ORAL
  Filled 2023-02-02: qty 1

## 2023-02-02 MED ORDER — LIDOCAINE 5 % EX PTCH
1.0000 | MEDICATED_PATCH | CUTANEOUS | Status: DC
  Administered 2023-02-02: 1 via TRANSDERMAL
  Filled 2023-02-02: qty 1

## 2023-02-02 NOTE — ED Provider Notes (Signed)
[facility] Provider Note   CSN: [id number] Arrival date & time: 02/01/23  2348     History  Chief Complaint  Patient presents with   Back Pain    Joshua Burns is a 89 y.o. male.  89 year old male with multiple medical problems to include atrial fibrillation status post pacemaker on anticoagulation, hypertension, chronic back pain, chronic kidney disease who presents the ER today with an acute exacerbation of his right lower back pain.  Sounds like tonight the patient was walking to the bathroom and his back "locked up".  He states that it was sharp and shooting.  Did not seem to radiate much.  Was better with rest.  Later in the night after walking again to start hurting again but this time it hurt worse even when he was sitting.  He called his daughter who brought him here for further evaluation.  No urinary or bowel incontinence.  No numbness weakness or tingling in his legs.  At rest it seems to be better but worse when he twists.  No urinary symptoms.  No abdominal, chest, neck pain.  No headaches.  No shortness of breath.  No falls.  No recent illnesses.   Back Pain      Home Medications Prior to Admission medications   Medication Sig Start Date End Date Taking? Authorizing Provider  Lido-Capsaicin-Men-Methyl Sal (1ST MEDX-PATCH/ LIDOCAINE) 4-0.025-5-20 % [facility] Apply 1 patch topically daily as needed for up to 10 days (pain). 02/02/23 02/12/23 Yes Zeven Kocak, Corene Cornea, MD  methocarbamol (ROBAXIN) 500 MG tablet Take 1 tablet (500 mg total) by mouth 2 (two) times daily. 02/02/23  Yes Nyliah Nierenberg, Corene Cornea, MD  predniSONE (DELTASONE) 20 MG tablet 3 tabs po daily x 3 days, then 2 tabs x 3 days, then 1.5 tabs x 3 days, then 1 tab x 3 days, then 0.5 tabs x 3 days 02/02/23  Yes Brynden Thune, Corene Cornea, MD  donepezil (ARICEPT) 10 MG tablet TAKE 1 TABLET BY MOUTH EVERYDAY AT BEDTIME 01/08/23   Burchette, Alinda Sierras, MD  ELIQUIS 2.5 MG TABS tablet TAKE 1 TABLET BY MOUTH TWICE A DAY  12/15/21   Burchette, Alinda Sierras, MD  hydrochlorothiazide (HYDRODIURIL) 12.5 MG tablet TAKE 1 TABLET BY MOUTH EVERY DAY 12/11/22   Croitoru, Mihai, MD  metoprolol tartrate (LOPRESSOR) 25 MG tablet TAKE 1 TABLET BY MOUTH TWICE A DAY 12/11/22   Croitoru, Mihai, MD  Multiple Vitamin (MULTIVITAMIN WITH MINERALS) TABS tablet Take 1 tablet by mouth daily.    [provider]  potassium chloride SA (KLOR-CON M20) 20 MEQ tablet TAKE 1 TABLET BY MOUTH DAILY. NEEDS OFFICE VISIT FOR FURTHER REFILLS CALL [phone] 01/09/23   Eulas Post, MD  simvastatin (ZOCOR) 20 MG tablet TAKE 1 TABLET BY MOUTH EVERY DAY AT NIGHT TIME 10/30/22   Croitoru, Mihai, MD      Allergies    Ace inhibitors and Diltiazem    Review of Systems   Review of Systems  Musculoskeletal:  Positive for back pain.    Physical Exam Updated Vital Signs BP (!) 181/81 (BP Location: Right Arm)   Pulse 62   Temp 98.7 F (37.1 C) (Oral)   Resp 16   Ht 5' 5"$  (1.651 m)   Wt 60.3 kg   SpO2 96%   BMI 22.13 kg/m  Physical Exam Vitals and nursing note reviewed.  Constitutional:      Appearance: He is well-developed.  HENT:     Head: Normocephalic and atraumatic.  Eyes:  Pupils: Pupils are equal, round, and reactive to light.  Cardiovascular:     Rate and Rhythm: Normal rate.  Pulmonary:     Effort: Pulmonary effort is normal. No respiratory distress.  Abdominal:     General: Abdomen is flat. There is no distension.  Musculoskeletal:        General: Tenderness (Right lumbar paraspinal) present. Normal range of motion.     Cervical back: Normal range of motion.  Skin:    General: Skin is warm and dry.  Neurological:     General: No focal deficit present.     Mental Status: He is alert.     ED Results / Procedures / Treatments   Labs (all labs ordered are listed, but only abnormal results are displayed) Labs Reviewed  CBC WITH DIFFERENTIAL/PLATELET - Abnormal; Notable for the following components:       Result Value   RBC 2.96 (*)    Hemoglobin 8.6 (*)    HCT 26.8 (*)    Platelets 103 (*)    All other components within normal limits  COMPREHENSIVE METABOLIC PANEL - Abnormal; Notable for the following components:   Glucose, Bld 113 (*)    Creatinine, Ser 1.49 (*)    GFR, Estimated 43 (*)    All other components within normal limits    EKG None  Radiology No results found.  Procedures Procedures    Medications Ordered in ED Medications  lidocaine (LIDODERM) 5 % 1 patch (1 patch Transdermal Patch Applied 02/02/23 0009)  methocarbamol (ROBAXIN) tablet 500 mg (500 mg Oral Given 02/02/23 0009)  oxyCODONE-acetaminophen (PERCOCET/ROXICET) 5-325 MG per tablet 1 tablet (1 tablet Oral Given 02/02/23 0009)    ED Course/ Medical Decision Making/ A&P                             Medical Decision Making Amount and/or Complexity of Data Reviewed Labs: ordered.  Risk OTC drugs. Prescription drug management.   Patient with progressively worsening anemia.  His cardiologist did iron studies a couple days ago that showed low total iron binding capacity and the others are pending.  He also has thrombocytopenia.  On that these are related to his back pain today I think his back pain is from sciatica.  Will refer him to hematology for further workup to make sure this is not something multiple myeloma or other blood-borne cancer although is likely related to his kidneys.  His blood pressure was initially high and I considered aortic aneurysm however the pain was all in the back with a benign abdominal exam and his blood pressure improved significantly with rest and subsequently also with symptomatic treatment.  Patient was able to ambulate with much less difficulty although pain is still somewhat present.  Will treat him for muscular pain and sciatica.  Will refer to hematology.  Final Clinical Impression(s) / ED Diagnoses Final diagnoses:  Acute right-sided low back pain with right-sided sciatica   Anemia, unspecified type    Rx / DC Orders ED Discharge Orders          Ordered    Ambulatory referral to Hematology / Oncology       Comments: For workup of worsening anemia and thrombocytopenia   02/02/23 0224    predniSONE (DELTASONE) 20 MG tablet        02/02/23 0224    methocarbamol (ROBAXIN) 500 MG tablet  2 times daily        02/02/23  0224    Lido-Capsaicin-Men-Methyl Sal (1ST MEDX-PATCH/ LIDOCAINE) 4-0.025-5-20 % [facility]  Daily PRN        02/02/23 0224              Jakobe Blau, Corene Cornea, MD 02/02/23 [phone]

## 2023-02-09 ENCOUNTER — Ambulatory Visit: Payer: Medicare Other | Admitting: Family Medicine

## 2023-02-11 ENCOUNTER — Other Ambulatory Visit: Payer: Self-pay | Admitting: Family Medicine

## 2023-02-20 NOTE — Therapy (Addendum)
 OUTPATIENT PHYSICAL THERAPY THORACOLUMBAR EVALUATION/DC   Patient Name: Joshua Burns MRN: [id number] DOB:03-31-1925, 89 y.o., male Today's Date: 02/22/2023  END OF SESSION:  PT End of Session - 02/22/23 0625     Visit Number 1    Number of Visits 1    Authorization Type MEDICARE PART A AND B    PT Start Time 1104    PT Stop Time 1150    PT Time Calculation (min) 46 min    Activity Tolerance Patient tolerated treatment well    Behavior During Therapy WFL for tasks assessed/performed             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   Past Surgical History:  Procedure Laterality Date   ANTERIOR CERVICAL DECOMP/DISCECTOMY FUSION  02/2003   [facility]bert 05/08/2011   BRAIN SURGERY     CARDIOVASCULAR STRESS TEST  08-04-2008   EF 0%   CATARACT EXTRACTION W/ INTRAOCULAR LENS  IMPLANT, BILATERAL Bilateral    CORONARY ANGIOPLASTY  01/2003   thelbert 05/08/2011   INGUINAL HERNIA REPAIR Right 03/2004   thelbert 05/08/2011   INSERT / REPLACE / REMOVE PACEMAKER  11/28/2017   KNEE ARTHROSCOPY Left 09/2011   thelbert 09/26/2011   LOOP RECORDER INSERTION N/A 08/20/2017   Procedure: LOOP RECORDER INSERTION;  Surgeon: Francyne Headland, MD;  Location: [facility];  Service: Cardiovascular;  Laterality: N/A;   LOOP RECORDER REMOVAL  11/28/2017   LOOP RECORDER REMOVAL N/A 11/28/2017   Procedure: LOOP RECORDER  REMOVAL;  Surgeon: Francyne Headland, MD;  Location: [facility];  Service: Cardiovascular;  Laterality: N/A;   PACEMAKER IMPLANT N/A 11/28/2017   Procedure: PACEMAKER IMPLANT - DUAL CHAMBER;  Surgeon: Francyne Headland, MD;  Location: [facility];  Service: Cardiovascular;  Laterality: N/A;   PROSTATE BIOPSY  03/2008   US  ECHOCARDIOGRAPHY  08-04-2008   Est EF 55-60%   Patient Active Problem List   Diagnosis Date Noted   Fatigue 11/30/2017   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   Pacemaker 11/28/2017   Paroxysmal atrial fibrillation (HCC) 11/02/2017   Second degree Mobitz I AV block    Sick sinus syndrome (HCC) 08/18/2017   Fall    Syncope    Cognitive impairment 12/05/2016   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   Glaucoma 06/13/2012   Hyperlipidemia 01/28/2010   Essential hypertension 01/28/2010   BPH (benign prostatic hyperplasia) 01/28/2010   CAD (coronary artery disease) 08/22/2003    PCP: Pcp, No  REFERRING PROVIDER: Micheal Wolm ORN, MD  REFERRING DIAG: M54.9 (ICD-10-CM) - Back pain, unspecified back location, unspecified back pain laterality, unspecified chronicity   Rationale for Evaluation and Treatment: Rehabilitation  THERAPY DIAG:  Other low back pain  Muscle weakness (generalized)  ONSET DATE: Chronic  SUBJECTIVE:  SUBJECTIVE STATEMENT: Pt reports his back becomes stiff if he sits too long, especially when he sits on the hearth of the fireplace where he likes to sit most of the time. Pt reports he rides a stationary bike 2-3x a day.  PERTINENT HISTORY:  CAD, Presence of permanent cardiac pacemaker   PAIN:  Are you having pain? Yes: NPRS scale: 0/10 Pain location: back Pain description: stiffness Aggravating factors: early AM,  prolonged Relieving factors: moving around  PRECAUTIONS: None  WEIGHT BEARING RESTRICTIONS: No  FALLS:  Has patient fallen in last 6 months? No  LIVING ENVIRONMENT: Lives with: lives with their family Lives in: House/apartment Stairs: Yes: External: 2-3 steps; none Has following equipment at home: None  OCCUPATION: Retired  PLOF: Independent  PATIENT GOALS: To be less stiff, stronger  OBJECTIVE:   DIAGNOSTIC FINDINGS:  See Epic  PATIENT SURVEYS:  FOTO 88%  SCREENING FOR RED FLAGS: Bowel or bladder incontinence: No Cauda equina syndrome: No  COGNITION: Overall cognitive status: Within functional limits for tasks assessed     SENSATION: WFL  MUSCLE LENGTH: Hamstrings: Right WNLs deg; Left WNLs deg Debby test: Right WNLs deg; Left WNLs deg  POSTURE: rounded shoulders, forward head, increased thoracic kyphosis, decreased thoracic kyphosis, and flexed trunk   PALPATION: Not TTP  LUMBAR ROM:  Lumbar movements do not provoke pain AROM eval  Flexion Full  Extension Marked limitation  Right lateral flexion Mod limitation  Left lateral flexion Mod limitation  Right rotation Min limitation  Left rotation Min limitation   (Blank rows = not tested)  LOWER EXTREMITY ROM:    Grossly WNLs Active  Right eval Left eval  Hip flexion    Hip extension    Hip abduction    Hip adduction    Hip internal rotation    Hip external rotation    Knee flexion    Knee extension    Ankle dorsiflexion    Ankle plantarflexion    Ankle inversion    Ankle eversion     (Blank rows = not tested)  LOWER EXTREMITY MMT:    MMT Right eval Left eval  Hip flexion 4+ 4+  Hip extension 4- 4-  Hip abduction 4- 4-  Hip adduction 4+ 4+  Hip internal rotation 4+ 4+  Hip external rotation 4 4  Knee flexion 4+ 4+  Knee extension 4+ 4+  Ankle dorsiflexion    Ankle plantarflexion    Ankle inversion    Ankle eversion     (Blank rows = not tested)  LUMBAR SPECIAL TESTS:   Straight leg raise test: Negative, Slump test: Negative, SI Compression/distraction test: Negative, and FABER test: Negative  FUNCTIONAL TESTS:  5 times sit to stand: 15.1 sec s use of hands   Single leg standing: L and R 8-10 sec GAIT: Distance walked: 200' Assistive device utilized: Single point cane Level of assistance: Complete Independence Comments: Min hip abd weakness  TODAY'S TREATMENT:  [facility] Adult PT Treatment:                                                DATE: 02/21/23 Therapeutic Exercise: Developed, instructed in, and pt completed therex as noted in HEP  Self Care: Encouraged pt and his son to use other sitting options other than the fireplace hearth and to stand and move around 1-2x an hour.   PATIENT EDUCATION:  Education details: Eval findings, POC, HEP, self care  Person educated: Patient and Child(ren) Education method: Explanation, Demonstration, Tactile cues, Verbal cues, and Handouts Education comprehension: verbalized understanding, returned demonstration, verbal cues required, and tactile cues required  HOME EXERCISE PROGRAM: Access Code: [facility] URL: [email or web address] Date: 02/21/2023 Prepared by: Dasie Daft  Exercises - Sit to Stand  - 2 x daily - 7 x weekly - 1 sets - 10 reps - Standing Hip Abduction with Counter Support  - 2 x daily - 7 x weekly - 1 sets - 10-15 reps - Heel Toe Raises with Counter Support  - 2 x daily - 7 x weekly - 1 sets - 10-15 reps - Standing Single Leg Stance with Counter Support  - 2 x daily - 7 x weekly - 5 sets - 5 reps  ASSESSMENT:  CLINICAL IMPRESSION: Patient is a 89 y.o. male who was seen today for physical therapy evaluation and treatment for M54.9 (ICD-10-CM) - Back pain, unspecified back location, unspecified back pain laterality, unspecified chronicity. Pt and his son  report he is experiencing back stillness with prolonged sitting on a fireplace hearth. Pt demonstrates higher than expected strength, mobility, and function for his age. Pt was provided a HEP to help maintain hip and trunk strength. Changing sitting behavior should help address the stiffness he is experiencing. HEP was reviewed with both the pt and his son, and the pt returned proper demonstration. Pt's FOTO score indicates he perceives himself as higher functioning which is consistent with today's eval.   OBJECTIVE IMPAIRMENTS: decreased strength and pain.   ACTIVITY LIMITATIONS: sitting  PARTICIPATION LIMITATIONS: NA  PERSONAL FACTORS: Age and Past/current experiences are also affecting patient's functional outcome.   REHAB POTENTIAL: Good  CLINICAL DECISION MAKING: Stable/uncomplicated  EVALUATION COMPLEXITY: Low   GOALS:  SHORT TERM GOALS: Target date: 02/21/23  Pt will be Ind in an initial HEP  Baseline: provided Goal status: MET  2.  Pt willvoice understanding of stategies to minimize back stiffness Baseline: discussed Goal status: MET  PLAN:  PT FREQUENCY: Eval only c HEP  PT DURATION: other: Eval only with HEP  PLANNED INTERVENTIONS: NA  PLAN FOR NEXT SESSION: NA   Miquan Tandon MS, PT 02/22/23 8:57 AM   PHYSICAL THERAPY DISCHARGE SUMMARY  Visits from Start of Care: 1  Current functional level related to goals / functional outcomes: Eval   Remaining deficits: Immunologist / Equipment: HEP   Patient agrees to discharge. Patient goals were not met. Patient is being discharged due to not returning since the last visit.  Rajesh Wyss MS, PT 08/01/24 7:58 AM

## 2023-02-21 ENCOUNTER — Other Ambulatory Visit: Payer: Self-pay

## 2023-02-21 ENCOUNTER — Ambulatory Visit: Payer: Medicare Other | Attending: Family Medicine

## 2023-02-21 DIAGNOSIS — M549 Dorsalgia, unspecified: Secondary | ICD-10-CM | POA: Diagnosis not present

## 2023-02-21 DIAGNOSIS — M6281 Muscle weakness (generalized): Secondary | ICD-10-CM | POA: Insufficient documentation

## 2023-02-21 DIAGNOSIS — M5459 Other low back pain: Secondary | ICD-10-CM | POA: Insufficient documentation

## 2023-03-04 ENCOUNTER — Other Ambulatory Visit: Payer: Self-pay | Admitting: Family Medicine

## 2023-03-07 ENCOUNTER — Ambulatory Visit (INDEPENDENT_AMBULATORY_CARE_PROVIDER_SITE_OTHER): Payer: Medicare Other

## 2023-03-07 DIAGNOSIS — I495 Sick sinus syndrome: Secondary | ICD-10-CM

## 2023-03-08 LAB — CUP PACEART REMOTE DEVICE CHECK
Battery Remaining Longevity: 66 mo
Battery Voltage: 2.96 V
Brady Statistic AP VP Percent: 53.04 %
Brady Statistic AP VS Percent: 36.97 %
Brady Statistic AS VP Percent: 1.12 %
Brady Statistic AS VS Percent: 8.86 %
Brady Statistic RA Percent Paced: 89.72 %
Brady Statistic RV Percent Paced: 54.16 %
Date Time Interrogation Session: 20240313005533
Implantable Lead Connection Status: 753985
Implantable Lead Connection Status: 753985
Implantable Lead Implant Date: 20181205
Implantable Lead Implant Date: 20181205
Implantable Lead Location: 753859
Implantable Lead Location: 753860
Implantable Lead Model: 5076
Implantable Lead Model: 5076
Implantable Pulse Generator Implant Date: 20181205
Lead Channel Impedance Value: 266 Ohm
Lead Channel Impedance Value: 266 Ohm
Lead Channel Impedance Value: 361 Ohm
Lead Channel Impedance Value: 418 Ohm
Lead Channel Pacing Threshold Amplitude: 0.75 V
Lead Channel Pacing Threshold Amplitude: 1.125 V
Lead Channel Pacing Threshold Pulse Width: 0.4 ms
Lead Channel Pacing Threshold Pulse Width: 0.4 ms
Lead Channel Sensing Intrinsic Amplitude: 1.125 mV
Lead Channel Sensing Intrinsic Amplitude: 1.125 mV
Lead Channel Sensing Intrinsic Amplitude: 6.5 mV
Lead Channel Sensing Intrinsic Amplitude: 6.5 mV
Lead Channel Setting Pacing Amplitude: 1.5 V
Lead Channel Setting Pacing Amplitude: 2.5 V
Lead Channel Setting Pacing Pulse Width: 0.4 ms
Lead Channel Setting Sensing Sensitivity: 0.9 mV
Zone Setting Status: 755011
Zone Setting Status: 755011

## 2023-03-20 ENCOUNTER — Other Ambulatory Visit: Payer: Self-pay | Admitting: *Deleted

## 2023-03-20 DIAGNOSIS — D649 Anemia, unspecified: Secondary | ICD-10-CM

## 2023-03-20 NOTE — Progress Notes (Signed)
Lab orders entered  for new pt appt  

## 2023-03-26 NOTE — Progress Notes (Deleted)
New Hematology/Oncology Consult   Requesting MD: Dr. Merrily Pew  [phone]      Reason for Consult: Anemia  HPI: Joshua Burns is referred for evaluation of anemia.  He was seen in the emergency department for evaluation of back pain on 02/02/2023.  Hemoglobin returned at 8.6, MCV 90.5, white count 6.5, platelet count 103,000; creatinine 1.49, normal calcium, normal albumin.  Iron studies from 02/01/2023 showed TIBC mildly decreased at 247, UIBC 196, iron 51, percent saturation 21, ferritin 222; B12 greater than 2000; folate greater than 20.  He was evaluated by Dr. Jana Hakim 08/19/2017 for anemia and thrombocytopenia.  He was hospitalized at that time with a syncopal episode.  Admission CBC returned with a hemoglobin of 9.3, MCV 87, white count 5.1, platelet count 55,000.  Laboratory evaluation-B12 [id number]; folate normal at 10.2; ferritin normal at 133; LDH normal; multiple myeloma panel with normal immunoglobulin levels, no M spike observed, normal immunofixation pattern; peripheral blood flow cytometry showed no monoclonal B-cell population or abnormal T-cell phenotype.  Remote labs in EMR-09/16/2012 hemoglobin 12.4, white count 4.3, platelet count 123,000.     Past Medical History:  Diagnosis Date   Alzheimer disease ([place])    Arthritis    "knees" (11/28/2017)   BPH (benign prostatic hyperplasia) 01/28/2010   Qualifier: Diagnosis of  By: Joyce Gross     CAD (coronary artery disease)    Cognitive impairment 12/05/2016   Glaucoma, both eyes    HYPERLIPIDEMIA 01/28/2010   HYPERTENSION 01/28/2010   HYPERTROPHY PROSTATE W/UR OBST & OTH LUTS 01/28/2010   Paroxysmal atrial fibrillation ([place]) 11/02/2017   Presence of permanent cardiac pacemaker 11/28/2017   Second degree Mobitz I AV block    Sick sinus syndrome ([place]) 08/18/2017   Syncope    Tachycardia-bradycardia syndrome ([place]) 11/28/2017     Past Surgical History:  Procedure Laterality Date   ANTERIOR CERVICAL DECOMP/DISCECTOMY FUSION   02/2003   Archie Endo 05/08/2011   BRAIN SURGERY     CARDIOVASCULAR STRESS TEST  08-04-2008   EF 0%   CATARACT EXTRACTION W/ INTRAOCULAR LENS  IMPLANT, BILATERAL Bilateral    CORONARY ANGIOPLASTY  01/2003   Archie Endo 05/08/2011   INGUINAL HERNIA REPAIR Right 03/2004   Archie Endo 05/08/2011   INSERT / REPLACE / REMOVE PACEMAKER  11/28/2017   KNEE ARTHROSCOPY Left 09/2011   Archie Endo 09/26/2011   LOOP RECORDER INSERTION N/A 08/20/2017   Procedure: LOOP RECORDER INSERTION;  Surgeon: Sanda Klein, MD;  Location: [facility];  Service: Cardiovascular;  Laterality: N/A;   LOOP RECORDER REMOVAL  11/28/2017   LOOP RECORDER REMOVAL N/A 11/28/2017   Procedure: LOOP RECORDER REMOVAL;  Surgeon: Sanda Klein, MD;  Location: [facility];  Service: Cardiovascular;  Laterality: N/A;   PACEMAKER IMPLANT N/A 11/28/2017   Procedure: PACEMAKER IMPLANT - DUAL CHAMBER;  Surgeon: Sanda Klein, MD;  Location: [place] [facility];  Service: Cardiovascular;  Laterality: N/A;   PROSTATE BIOPSY  03/2008   US ECHOCARDIOGRAPHY  08-04-2008   Est EF 55-60%  :   Current Outpatient Medications:    donepezil (ARICEPT) 10 MG tablet, TAKE 1 TABLET BY MOUTH EVERYDAY AT BEDTIME, Disp: 90 tablet, Rfl: 0   ELIQUIS 2.5 MG TABS tablet, TAKE 1 TABLET BY MOUTH TWICE A DAY, Disp: 180 tablet, Rfl: 0   hydrochlorothiazide (HYDRODIURIL) 12.5 MG tablet, TAKE 1 TABLET BY MOUTH EVERY DAY, Disp: 90 tablet, Rfl: 3   methocarbamol (ROBAXIN) 500 MG tablet, Take 1 tablet (500 mg total) by mouth 2 (two) times  daily., Disp: 20 tablet, Rfl: 0   metoprolol tartrate (LOPRESSOR) 25 MG tablet, TAKE 1 TABLET BY MOUTH TWICE A DAY, Disp: 180 tablet, Rfl: 3   Multiple Vitamin (MULTIVITAMIN WITH MINERALS) TABS tablet, Take 1 tablet by mouth daily., Disp: , Rfl:    potassium chloride SA (KLOR-CON M20) 20 MEQ tablet, TAKE 1 TABLET BY MOUTH DAILY. NEEDS OFFICE VISIT FOR FURTHER REFILLS CALL [phone], Disp: 90 tablet, Rfl: 3   predniSONE (DELTASONE) 20  MG tablet, 3 tabs po daily x 3 days, then 2 tabs x 3 days, then 1.5 tabs x 3 days, then 1 tab x 3 days, then 0.5 tabs x 3 days, Disp: 27 tablet, Rfl: 0   simvastatin (ZOCOR) 20 MG tablet, TAKE 1 TABLET BY MOUTH EVERY DAY AT NIGHT TIME, Disp: 90 tablet, Rfl: 3:  :   Allergies  Allergen Reactions   Ace Inhibitors Swelling   Diltiazem Swelling and Palpitations  :  FH:  SOCIAL HISTORY:  Review of Systems:  Positives include:  A complete ROS was otherwise negative.   Physical Exam:  There were no vitals taken for this visit.  HEENT: *** Lungs: *** Cardiac: *** Abdomen: *** GU: ***  Vascular: *** Lymph nodes: *** Neurologic: *** Skin: *** Musculoskeletal: ***  LABS:  No results for input(s): "WBC", "HGB", "HCT", "PLT" in the last 72 hours.  No results for input(s): "NA", "K", "CL", "CO2", "GLUCOSE", "BUN", "CREATININE", "CALCIUM" in the last 72 hours.    RADIOLOGY:  CUP PACEART REMOTE DEVICE CHECK  Result Date: 03/08/2023 Scheduled remote reviewed. Normal device function.  5 NSVT, two EGM's show 7-9 beats Remaining 3 show brief atrial driven 1:1 Next remote 91 days. LA   Assessment and Plan:   ***    Ned Card, NP 03/26/2023, 4:20 PM

## 2023-03-27 ENCOUNTER — Inpatient Hospital Stay: Payer: Medicare Other

## 2023-03-27 ENCOUNTER — Inpatient Hospital Stay: Payer: Medicare Other | Admitting: Nurse Practitioner

## 2023-04-11 NOTE — Progress Notes (Signed)
Remote pacemaker transmission.   

## 2023-04-23 NOTE — Progress Notes (Unsigned)
New Hematology/Oncology Consult   Requesting MD: Dr. Marily Memos  [phone]      Reason for Consult: Anemia  HPI: [name] is a 89 year old man referred for evaluation of anemia.  He was seen in the emergency department on 02/02/2023 for evaluation of back pain.  CBC showed hemoglobin 8.6, MCV 90, white count 6.5 and platelet count 103,000; chemistry panel unremarkable except creatinine 1.49.    Labs from 02/01/2023-vitamin B12 greater than 2000, ferritin 222, folate greater than 20.  Comparison CBCs-01/09/2023 hemoglobin 9.2, platelet count 117,000; 07/22/2021 hemoglobin 9.8, platelet count 76,000; 12/09/2018 hemoglobin 10.6, platelet count 64,000; 11/28/2017 hemoglobin 9.6, platelet count 73,000; 09/16/2012 hemoglobin 12.4, white count 4.3, platelet count 123,000.  He was evaluated for anemia and thrombocytopenia by Dr. Darnelle Catalan 08/19/2017.  He was hospitalized at the time.  CBC showed hemoglobin 9.3, MCV 87, white count 5.1, platelet count 55,000; peripheral blood for flow cytometry showed no monoclonal B-cell population or abnormal T-cell phenotype; B12 1374; folate 10.2; ferritin 133; LDH normal at 165; myeloma panel unremarkable with no M spike observed and normal immunofixation.  He feels well.  Describes his energy level as "fairly good".  His daughter notes his appetite has fluctuated recently.  He is eating 2 meals a day.  Weight is stable.  No fevers or sweats.  No recent or recurrent infections.  He denies bleeding.     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     Past Surgical History:  Procedure Laterality Date   ANTERIOR CERVICAL DECOMP/DISCECTOMY FUSION  02/2003   Hattie Perch 05/08/2011   BRAIN SURGERY     CARDIOVASCULAR STRESS TEST  08-04-2008   EF 0%   CATARACT EXTRACTION W/ INTRAOCULAR LENS  IMPLANT, BILATERAL Bilateral    CORONARY ANGIOPLASTY  01/2003   Hattie Perch 05/08/2011   INGUINAL HERNIA REPAIR Right 03/2004   Hattie Perch 05/08/2011   INSERT / REPLACE / REMOVE PACEMAKER  11/28/2017   KNEE ARTHROSCOPY Left 09/2011   Hattie Perch 09/26/2011   LOOP RECORDER INSERTION N/A 08/20/2017   Procedure: LOOP RECORDER INSERTION;  Surgeon: Thurmon Fair, MD;  [facility];  Service: Cardiovascular;  Laterality: N/A;   LOOP RECORDER REMOVAL  11/28/2017   LOOP RECORDER REMOVAL N/A 11/28/2017   Procedure: LOOP RECORDER REMOVAL;  Surgeon: Thurmon Fair, MD;  [facility]: [facility];  Service: Cardiovascular;  Laterality: N/A;   PACEMAKER IMPLANT N/A 11/28/2017   Procedure: PACEMAKER IMPLANT - DUAL CHAMBER;  Surgeon: Thurmon Fair, MD;  [facility];  Service: Cardiovascular;  Laterality: N/A;   PROSTATE BIOPSY  03/2008   US ECHOCARDIOGRAPHY  08-04-2008   Est EF 55-60%  :   Current Outpatient Medications:    donepezil (ARICEPT) 10 MG tablet, TAKE 1 TABLET BY MOUTH EVERYDAY AT BEDTIME, Disp: 90 tablet, Rfl: 0   ELIQUIS 2.5 MG TABS tablet, TAKE 1  TABLET BY MOUTH TWICE A DAY, Disp: 180 tablet, Rfl: 0   hydrochlorothiazide (HYDRODIURIL) 12.5 MG tablet, TAKE 1 TABLET BY MOUTH EVERY DAY, Disp: 90 tablet, Rfl: 3   methocarbamol (ROBAXIN) 500 MG tablet, Take 1 tablet (500 mg total) by mouth 2 (two) times daily., Disp: 20 tablet, Rfl: 0   metoprolol tartrate (LOPRESSOR) 25 MG tablet, TAKE 1 TABLET BY MOUTH TWICE A DAY, Disp: 180 tablet, Rfl: 3   Multiple  Vitamin (MULTIVITAMIN WITH MINERALS) TABS tablet, Take 1 tablet by mouth daily., Disp: , Rfl:    potassium chloride SA (KLOR-CON M20) 20 MEQ tablet, TAKE 1 TABLET BY MOUTH DAILY. NEEDS OFFICE VISIT FOR FURTHER REFILLS CALL [phone], Disp: 90 tablet, Rfl: 3   predniSONE (DELTASONE) 20 MG tablet, 3 tabs po daily x 3 days, then 2 tabs x 3 days, then 1.5 tabs x 3 days, then 1 tab x 3 days, then 0.5 tabs x 3 days, Disp: 27 tablet, Rfl: 0   simvastatin (ZOCOR) 20 MG tablet, TAKE 1 TABLET BY MOUTH EVERY DAY AT NIGHT TIME, Disp: 90 tablet, Rfl: 3:    Allergies  Allergen Reactions   Ace Inhibitors Swelling   Diltiazem Swelling and Palpitations    FH: No family history of anemia.  SOCIAL HISTORY: He lives in [place] with his daughter.  He has 3 children total.  He is retired from the [employer].  He quit drinking and smoking more than 30 years ago.  Review of Systems: He denies shortness of breath.  No chest pain.  No dysphagia.  No bowel or bladder problems.  No numbness or tingling in the hands or feet.  Physical Exam:  Blood pressure (!) 150/73, pulse 100, temperature 98.2 F (36.8 C), temperature source Oral, resp. rate 20, height 5\' 5"  (1.651 m), weight 132 lb 9.6 oz (60.1 kg), SpO2 100 %.  HEENT: No thrush or ulcers. Lungs: Distant breath sounds. Cardiac: Regular with premature beats. Abdomen: No hepatosplenomegaly. Vascular: No leg edema. Lymph nodes: No palpable cervical, supraclavicular, axillary or inguinal lymph nodes. Skin: Scattered areas of hyperpigmentation mid to low back.   LABS:   Recent Labs    04/24/23 1059  WBC 5.6  HGB 10.0*  HCT 31.4*  PLT 111*  Peripheral blood smear-few ovalocytes, teardrops, acanthocytes, variation in red cell size, microcytes; white blood cell morphology unremarkable, majority of the white cells are neutrophils and lymphocytes, increased number of granular lymphocytes, no blasts or other young forms; platelets appear mildly  decreased  No results for input(s): "NA", "K", "CL", "CO2", "GLUCOSE", "BUN", "CREATININE", "CALCIUM" in the last 72 hours.    RADIOLOGY:  No results found.  Assessment and Plan:   Anemia and thrombocytopenia, longstanding Chronic kidney disease CAD Atrial fibrillation Hypertension Hypercholesterolemia Alzheimer's  Mr. Rufener has fairly longstanding anemia and thrombocytopenia.  Blood counts today are stable.  We discussed the possibility of MDS.  There may be a component of anemia of chronic renal failure.  Plan observation for now as counts are not significantly changed over many years.  We did not schedule additional follow-up in the hematology clinic.  We are available to see him in the future with a progressive decline in the hemoglobin and/or platelets, or new hematologic abnormality.  Patient seen with Dr. Truett Perna.   Lonna Cobb, NP 04/24/2023, 11:35 AM  This was a shared visit with Lonna Cobb.  Mr. Enochs was interviewed and examined.  I reviewed the peripheral blood smear.  He is referred for evaluation of anemia  and thrombocytopenia.  The hematologic findings are chronic.  He has been evaluated by hematology in the past.  The differential diagnosis includes myelodysplasia.  The anemia may be in part due to chronic renal insufficiency.  We did not perform additional diagnostic evaluation today.  He will follow-up with Dr. Caryl Never.  We are available to see him if he develops progressive anemia/thrombocytopenia or a new hematologic abnormality.  He may be a candidate for a trial of erythropoietin therapy if the hemoglobin falls.  I was present for greater than 50% of today's visit.  I performed medical decision making.  Mancel Bale, MD

## 2023-04-24 ENCOUNTER — Inpatient Hospital Stay: Payer: Medicare Other | Attending: Nurse Practitioner

## 2023-04-24 ENCOUNTER — Encounter: Payer: Self-pay | Admitting: Nurse Practitioner

## 2023-04-24 ENCOUNTER — Inpatient Hospital Stay (HOSPITAL_BASED_OUTPATIENT_CLINIC_OR_DEPARTMENT_OTHER): Payer: Medicare Other | Admitting: Nurse Practitioner

## 2023-04-24 VITALS — BP 150/73 | HR 100 | Temp 98.2°F | Resp 20 | Ht 65.0 in | Wt 132.6 lb

## 2023-04-24 DIAGNOSIS — I480 Paroxysmal atrial fibrillation: Secondary | ICD-10-CM | POA: Insufficient documentation

## 2023-04-24 DIAGNOSIS — N189 Chronic kidney disease, unspecified: Secondary | ICD-10-CM | POA: Diagnosis not present

## 2023-04-24 DIAGNOSIS — D696 Thrombocytopenia, unspecified: Secondary | ICD-10-CM | POA: Insufficient documentation

## 2023-04-24 DIAGNOSIS — G309 Alzheimer's disease, unspecified: Secondary | ICD-10-CM | POA: Diagnosis not present

## 2023-04-24 DIAGNOSIS — E7800 Pure hypercholesterolemia, unspecified: Secondary | ICD-10-CM | POA: Insufficient documentation

## 2023-04-24 DIAGNOSIS — I129 Hypertensive chronic kidney disease with stage 1 through stage 4 chronic kidney disease, or unspecified chronic kidney disease: Secondary | ICD-10-CM | POA: Insufficient documentation

## 2023-04-24 DIAGNOSIS — D649 Anemia, unspecified: Secondary | ICD-10-CM | POA: Insufficient documentation

## 2023-04-24 DIAGNOSIS — I2510 Atherosclerotic heart disease of native coronary artery without angina pectoris: Secondary | ICD-10-CM | POA: Diagnosis not present

## 2023-04-24 LAB — CBC WITH DIFFERENTIAL (CANCER CENTER ONLY)
Abs Immature Granulocytes: 0.02 10*3/uL (ref 0.00–0.07)
Basophils Absolute: 0 10*3/uL (ref 0.0–0.1)
Basophils Relative: 1 %
Eosinophils Absolute: 0 10*3/uL (ref 0.0–0.5)
Eosinophils Relative: 0 %
HCT: 31.4 % — ABNORMAL LOW (ref 39.0–52.0)
Hemoglobin: 10 g/dL — ABNORMAL LOW (ref 13.0–17.0)
Immature Granulocytes: 0 %
Lymphocytes Relative: 27 %
Lymphs Abs: 1.5 10*3/uL (ref 0.7–4.0)
MCH: 28.9 pg (ref 26.0–34.0)
MCHC: 31.8 g/dL (ref 30.0–36.0)
MCV: 90.8 fL (ref 80.0–100.0)
Monocytes Absolute: 0.7 10*3/uL (ref 0.1–1.0)
Monocytes Relative: 12 %
Neutro Abs: 3.4 10*3/uL (ref 1.7–7.7)
Neutrophils Relative %: 60 %
Platelet Count: 111 10*3/uL — ABNORMAL LOW (ref 150–400)
RBC: 3.46 MIL/uL — ABNORMAL LOW (ref 4.22–5.81)
RDW: 14.4 % (ref 11.5–15.5)
WBC Count: 5.6 10*3/uL (ref 4.0–10.5)
nRBC: 0 % (ref 0.0–0.2)

## 2023-04-24 LAB — SAVE SMEAR(SSMR), FOR PROVIDER SLIDE REVIEW

## 2023-05-13 ENCOUNTER — Other Ambulatory Visit: Payer: Self-pay | Admitting: Family Medicine

## 2023-06-06 ENCOUNTER — Ambulatory Visit (INDEPENDENT_AMBULATORY_CARE_PROVIDER_SITE_OTHER): Payer: Medicare Other

## 2023-06-06 DIAGNOSIS — I495 Sick sinus syndrome: Secondary | ICD-10-CM | POA: Diagnosis not present

## 2023-06-06 LAB — CUP PACEART REMOTE DEVICE CHECK
Battery Remaining Longevity: 57 mo
Battery Voltage: 2.96 V
Brady Statistic AP VP Percent: 55.55 %
Brady Statistic AP VS Percent: 31.02 %
Brady Statistic AS VP Percent: 1.99 %
Brady Statistic AS VS Percent: 11.44 %
Brady Statistic RA Percent Paced: 86.25 %
Brady Statistic RV Percent Paced: 57.54 %
Date Time Interrogation Session: 20240612005302
Implantable Lead Connection Status: 753985
Implantable Lead Connection Status: 753985
Implantable Lead Implant Date: 20181205
Implantable Lead Implant Date: 20181205
Implantable Lead Location: 753859
Implantable Lead Location: 753860
Implantable Lead Model: 5076
Implantable Lead Model: 5076
Implantable Pulse Generator Implant Date: 20181205
Lead Channel Impedance Value: 247 Ohm
Lead Channel Impedance Value: 266 Ohm
Lead Channel Impedance Value: 342 Ohm
Lead Channel Impedance Value: 399 Ohm
Lead Channel Pacing Threshold Amplitude: 0.75 V
Lead Channel Pacing Threshold Amplitude: 1.125 V
Lead Channel Pacing Threshold Pulse Width: 0.4 ms
Lead Channel Pacing Threshold Pulse Width: 0.4 ms
Lead Channel Sensing Intrinsic Amplitude: 1.625 mV
Lead Channel Sensing Intrinsic Amplitude: 1.625 mV
Lead Channel Sensing Intrinsic Amplitude: 5.75 mV
Lead Channel Sensing Intrinsic Amplitude: 5.75 mV
Lead Channel Setting Pacing Amplitude: 1.5 V
Lead Channel Setting Pacing Amplitude: 2.5 V
Lead Channel Setting Pacing Pulse Width: 0.4 ms
Lead Channel Setting Sensing Sensitivity: 0.9 mV
Zone Setting Status: 755011
Zone Setting Status: 755011

## 2023-07-02 NOTE — Progress Notes (Signed)
Remote pacemaker transmission.   

## 2023-07-25 ENCOUNTER — Encounter (INDEPENDENT_AMBULATORY_CARE_PROVIDER_SITE_OTHER): Payer: Self-pay

## 2023-08-10 ENCOUNTER — Other Ambulatory Visit: Payer: Self-pay | Admitting: Family Medicine

## 2023-08-17 ENCOUNTER — Ambulatory Visit (INDEPENDENT_AMBULATORY_CARE_PROVIDER_SITE_OTHER): Payer: Medicare Other

## 2023-08-17 VITALS — BP 120/62 | Ht 65.0 in | Wt 130.6 lb

## 2023-08-17 DIAGNOSIS — Z0000 Encounter for general adult medical examination without abnormal findings: Secondary | ICD-10-CM

## 2023-08-17 NOTE — Patient Instructions (Addendum)
Mr. Sposato , Thank you for taking time to come for your Medicare Wellness Visit. I appreciate your ongoing commitment to your health goals. Please review the following plan we discussed and let me know if I can assist you in the future.   Referrals/Orders/Follow-Ups/Clinician Recommendations:   This is a list of the screening recommended for you and due dates:  Health Maintenance  Topic Date Due   COVID-19 Vaccine (4 - 2023-24 season) 11/20/2022   Flu Shot  07/26/2023   Medicare Annual Wellness Visit  08/16/2024   DTaP/Tdap/Td vaccine (2 - Td or Tdap) 08/19/2027   Pneumonia Vaccine  Completed   Zoster (Shingles) Vaccine  Completed   HPV Vaccine  Aged Out    Advanced directives: (In Chart) A copy of your advanced directives are scanned into your chart should your provider ever need it.  Next Medicare Annual Wellness Visit scheduled for next year: Yes

## 2023-08-17 NOTE — Progress Notes (Signed)
Subjective:   Duriel Stranger is a 89 y.o. male who presents for Medicare Annual/Subsequent preventive examination.  Visit Complete: In person    Review of Systems      Cardiac Risk Factors include: advanced age (>54men, >39 women);male gender     Objective:    Today's Vitals   08/17/23 1126  BP: 120/62  TempSrc: Oral  Weight: 130 lb 9.6 oz (59.2 kg)  Height: 5\' 5"  (1.651 m)   Body mass index is 21.73 kg/m.     08/17/2023   11:43 AM 04/24/2023   11:34 AM 02/21/2023   11:10 AM 02/01/2023   11:53 PM 08/14/2022   11:04 AM 07/22/2021    2:01 PM 07/20/2020    2:50 PM  Advanced Directives  Does Patient Have a Medical Advance Directive? Yes Yes Yes Yes Yes Yes Yes  Type of Estate agent of [place];Living will Living will;Healthcare Power of [employer] Power of [place];Living will Living will;Healthcare Power of [employer] Power of [place];Living will Healthcare Power of [place];Living will Healthcare Power of [place];Living will  Does patient want to make changes to medical advance directive? No - Patient declined No - Patient declined No - Patient declined  No - Patient declined  No - Patient declined  Copy of Healthcare Power of Attorney in Chart? Yes - validated most recent copy scanned in chart (See row information)  No - copy requested No - copy requested Yes - validated most recent copy scanned in chart (See row information) No - copy requested Yes - validated most recent copy scanned in chart (See row information)    Current Medications (verified) Outpatient Encounter Medications as of 08/17/2023  Medication Sig   donepezil (ARICEPT) 10 MG tablet TAKE 1 TABLET BY MOUTH EVERYDAY AT BEDTIME   ELIQUIS 2.5 MG TABS tablet TAKE 1 TABLET BY MOUTH TWICE A DAY   hydrochlorothiazide (HYDRODIURIL) 12.5 MG tablet TAKE 1 TABLET BY MOUTH EVERY DAY   methocarbamol (ROBAXIN) 500 MG tablet Take 1 tablet (500 mg total) by mouth 2 (two) times daily.    metoprolol tartrate (LOPRESSOR) 25 MG tablet TAKE 1 TABLET BY MOUTH TWICE A DAY   Multiple Vitamin (MULTIVITAMIN WITH MINERALS) TABS tablet Take 1 tablet by mouth daily.   potassium chloride SA (KLOR-CON M20) 20 MEQ tablet TAKE 1 TABLET BY MOUTH DAILY. NEEDS OFFICE VISIT FOR FURTHER REFILLS CALL [phone]   predniSONE (DELTASONE) 20 MG tablet 3 tabs po daily x 3 days, then 2 tabs x 3 days, then 1.5 tabs x 3 days, then 1 tab x 3 days, then 0.5 tabs x 3 days (Patient not taking: Reported on 08/17/2023)   simvastatin (ZOCOR) 20 MG tablet TAKE 1 TABLET BY MOUTH EVERY DAY AT NIGHT TIME   No facility-administered encounter medications on file as of 08/17/2023.    Allergies (verified) Ace inhibitors and Diltiazem   History: Past Medical History:  Diagnosis Date   Alzheimer disease (HCC)    Arthritis    "knees" (11/28/2017)   BPH (benign prostatic hyperplasia) 01/28/2010   Qualifier: Diagnosis of  By: Rita Ohara     CAD (coronary artery disease)    Cognitive impairment 12/05/2016   Glaucoma, both eyes    HYPERLIPIDEMIA 01/28/2010   HYPERTENSION 01/28/2010   HYPERTROPHY PROSTATE W/UR OBST & OTH LUTS 01/28/2010   Paroxysmal atrial fibrillation (HCC) 11/02/2017   Presence of permanent cardiac pacemaker 11/28/2017   Second degree Mobitz I AV block    Sick sinus syndrome (HCC) 08/18/2017  Syncope    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   Past Surgical History:  Procedure Laterality Date   ANTERIOR CERVICAL DECOMP/DISCECTOMY FUSION  02/2003   Hattie Perch 05/08/2011   BRAIN SURGERY     CARDIOVASCULAR STRESS TEST  08-04-2008   EF 0%   CATARACT EXTRACTION W/ INTRAOCULAR LENS  IMPLANT, BILATERAL Bilateral    CORONARY ANGIOPLASTY  01/2003   Hattie Perch 05/08/2011   INGUINAL HERNIA REPAIR Right 03/2004   Hattie Perch 05/08/2011   INSERT / REPLACE / REMOVE PACEMAKER  11/28/2017   KNEE ARTHROSCOPY Left 09/2011   Hattie Perch 09/26/2011   LOOP RECORDER INSERTION N/A 08/20/2017   Procedure: LOOP RECORDER  INSERTION;  Surgeon: Thurmon Fair, MD;  [facility];  Service: Cardiovascular;  Laterality: N/A;   LOOP RECORDER REMOVAL  11/28/2017   LOOP RECORDER REMOVAL N/A 11/28/2017   Procedure: LOOP RECORDER REMOVAL;  Surgeon: Thurmon Fair, MD;  [facility];  Service: Cardiovascular;  Laterality: N/A;   PACEMAKER IMPLANT N/A 11/28/2017   Procedure: PACEMAKER IMPLANT - DUAL CHAMBER;  Surgeon: Thurmon Fair, MD;  [facility];  Service: Cardiovascular;  Laterality: N/A;   PROSTATE BIOPSY  03/2008   US ECHOCARDIOGRAPHY  08-04-2008   Est EF 55-60%   Family History  Problem Relation Age of Onset   Heart disease Mother    Heart disease Father    Aneurysm Brother    Social History   Socioeconomic History   Marital status: Widowed    Spouse name: Not on file   Number of children: 3   Years of education: Not on file   Highest education level: Not on file  Occupational History    Employer: [employer]  Tobacco Use   Smoking status: Former    Current packs/day: 0.00    Average packs/day: 1 pack/day for 38.0 years (38.0 ttl pk-yrs)    Types: Cigarettes    Start date: 12/25/1940    Quit date: 12/25/1978    Years since quitting: 44.6   Smokeless tobacco: Never  Vaping Use   Vaping status: Never Used  Substance and Sexual Activity   Alcohol use: No    Comment: does not    Drug use: No   Sexual activity: Not on file  Other Topics Concern   Not on file  Social History Narrative   Not on file   Social Determinants of Health   Financial Resource Strain: Low Risk  (08/17/2023)   Overall Financial Resource Strain (CARDIA)    Difficulty of Paying Living Expenses: Not hard at all  Food Insecurity: No Food Insecurity (08/17/2023)   Hunger Vital Sign    Worried About Running Out of Food in the Last Year: Never true    Ran Out of Food in the Last Year: Never true  Transportation Needs: No Transportation Needs (08/17/2023)   PRAPARE -  Administrator, Civil Service (Medical): No    Lack of Transportation (Non-Medical): No  Physical Activity: Inactive (08/17/2023)   Exercise Vital Sign    Days of Exercise per Week: 0 days    Minutes of Exercise per Session: 0 min  Stress: No Stress Concern Present (08/17/2023)   [employer] of Occupational Health - Occupational Stress Questionnaire    Feeling of Stress : Not at all  Social Connections: Moderately Integrated (08/17/2023)   Social Connection and Isolation Panel [NHANES]    Frequency of Communication with Friends and Family: More than three times a week  Frequency of Social Gatherings with Friends and Family: More than three times a week    Attends Religious Services: More than 4 times per year    Active Member of Clubs or Organizations: Yes    Attends Banker Meetings: More than 4 times per year    Marital Status: Widowed    Tobacco Counseling Counseling given: Not Answered   Clinical Intake:  Pre-visit preparation completed: Yes  Pain : No/denies pain     BMI - recorded: 21.73 Nutritional Status: BMI of 19-24  Normal Nutritional Risks: None Diabetes: No  How often do you need to have someone help you when you read instructions, pamphlets, or other written materials from your doctor or pharmacy?: 1 - Never  Interpreter Needed?: No  Information entered by :: Theresa Mulligan LPN   Activities of Daily Living    08/17/2023   11:39 AM  In your present state of health, do you have any difficulty performing the following activities:  Hearing? 0  Vision? 0  Difficulty concentrating or making decisions? 0  Walking or climbing stairs? 0  Dressing or bathing? 0  Doing errands, shopping? 0  Preparing Food and eating ? N  Using the Toilet? N  In the past six months, have you accidently leaked urine? N  Do you have problems with loss of bowel control? N  Managing your Medications? N  Comment Daughter assist  Managing your  Finances? Y  Comment Daughter assist  Housekeeping or managing your Housekeeping? Y  Comment Daughter assist    Patient Care Team: Pcp, No as PCP - General [place], Peter M, MD (Cardiology)  Indicate any recent Medical Services you may have received from other than [facility] providers in the past year (date may be approximate).     Assessment:   This is a routine wellness examination for Kaj.  Hearing/Vision screen Hearing Screening - Comments:: Denies hearing difficulties   Vision Screening - Comments:: Wears rx glasses - up to date with routine eye exams with  Deferred  Dietary issues and exercise activities discussed:     Goals Addressed               This Visit's Progress     Stay Healthy (pt-stated)        Stay active and keep playing cards        Depression Screen    08/17/2023   11:38 AM 01/09/2023   10:41 AM 08/14/2022   11:00 AM 07/22/2021    2:04 PM 07/22/2021    2:02 PM 07/22/2021    2:00 PM 07/20/2020    2:52 PM  PHQ 2/9 Scores  PHQ - 2 Score 0 0 0 0 0 0 0  PHQ- 9 Score       0    Fall Risk    08/17/2023   11:41 AM 01/09/2023   10:38 AM 08/14/2022   11:03 AM 07/22/2021    2:02 PM 07/20/2020    2:51 PM  Fall Risk   Falls in the past year? 0 1 0 0 0  Number falls in past yr: 0 0 0 0 0  Injury with Fall? 0 0 0 0 0  Risk for fall due to : No Fall Risks No Fall Risks No Fall Risks  Medication side effect;No Fall Risks  Follow up Falls prevention discussed Falls evaluation completed   Falls evaluation completed;Falls prevention discussed    MEDICARE RISK AT HOME: Medicare Risk at Home Any stairs in or around  the home?: No If so, are there any without handrails?: No Home free of loose throw rugs in walkways, pet beds, electrical cords, etc?: Yes Adequate lighting in your home to reduce risk of falls?: Yes Life alert?: No Use of a cane, walker or w/c?: No Grab bars in the bathroom?: No Shower chair or bench in shower?: No Elevated toilet seat or a  handicapped toilet?: No  TIMED UP AND GO:  Was the test performed?  Yes  Length of time to ambulate 10 feet: 10 sec Gait steady and fast without use of assistive device    Cognitive Function:    12/26/2017   10:38 AM  MMSE - Mini Mental State Exam  Orientation to time 2  Orientation to time comments missed day, year and date  Orientation to Place 3  Orientation to Place-comments could not think of Dr.  Caryl Never name, did not know county  Registration 3  Attention/ Calculation 3  Recall 0  Language- name 2 objects 2  Language- repeat 1  Language- follow 3 step command 3  Language- read & follow direction 1  Write a sentence 1  Copy design 1  Total score 20        08/17/2023   11:43 AM 08/14/2022   11:04 AM 07/20/2020    2:53 PM  6CIT Screen  What Year? 0 points 4 points 0 points  What month? 0 points 3 points 0 points  What time? 0 points 3 points 0 points  Count back from 20 0 points 0 points 0 points  Months in reverse 0 points 0 points 2 points  Repeat phrase 0 points 2 points 10 points  Total Score 0 points 12 points 12 points    Immunizations Immunization History  Administered Date(s) Administered   Fluad Quad(high Dose 65+) 09/30/2019, 10/14/2021, 09/05/2022   Influenza Split 09/19/2012   Influenza, High Dose Seasonal PF 09/20/2018   Influenza, Seasonal, Injecte, Preservative Fre 09/29/2013, 09/30/2014   Influenza,inj,Quad PF,6+ Mos 09/09/2015   Influenza-Unspecified 09/17/2016, 10/01/2017, 10/14/2021   PFIZER(Purple Top)SARS-COV-2 Vaccination 12/09/2020, 12/15/2021   Pfizer Covid-19 Vaccine Bivalent Booster 59yrs & up 12/15/2021   Pneumococcal Conjugate-13 12/05/2016   Pneumococcal Polysaccharide-23 06/13/2012   Tdap 08/18/2017   Unspecified SARS-COV-2 Vaccination 09/25/2022   Zoster Recombinant(Shingrix) 10/14/2021, 09/25/2022   Zoster, Live 07/15/2012    TDAP status: Up to date  Flu Vaccine status: Due, Education has been provided regarding the  importance of this vaccine. Advised may receive this vaccine at local pharmacy or Health Dept. Aware to provide a copy of the vaccination record if obtained from local pharmacy or Health Dept. Verbalized acceptance and understanding.  Pneumococcal vaccine status: Up to date  Covid-19 vaccine status: Declined, Education has been provided regarding the importance of this vaccine but patient still declined. Advised may receive this vaccine at local pharmacy or Health Dept.or vaccine clinic. Aware to provide a copy of the vaccination record if obtained from local pharmacy or Health Dept. Verbalized acceptance and understanding.  Qualifies for Shingles Vaccine? Yes   Zostavax completed Yes   Shingrix Completed?: Yes  Screening Tests Health Maintenance  Topic Date Due   COVID-19 Vaccine (4 - 2023-24 season) 11/20/2022   INFLUENZA VACCINE  07/26/2023   Medicare Annual Wellness (AWV)  08/16/2024   DTaP/Tdap/Td (2 - Td or Tdap) 08/19/2027   Pneumonia Vaccine 48+ Years old  Completed   Zoster Vaccines- Shingrix  Completed   HPV VACCINES  Aged Out    Health Maintenance  Health  Maintenance Due  Topic Date Due   COVID-19 Vaccine (4 - 2023-24 season) 11/20/2022   INFLUENZA VACCINE  07/26/2023    Colorectal cancer screening: No longer required.   Lung Cancer Screening: (Low Dose CT Chest recommended if Age 74-80 years, 20 pack-year currently smoking OR have quit w/in 15years.) does not qualify.    Additional Screening:  Hepatitis C Screening: does not qualify; Completed   Vision Screening: Recommended annual ophthalmology exams for early detection of glaucoma and other disorders of the eye. Is the patient up to date with their annual eye exam?  Yes  Who is the provider or what is the name of the office in which the patient attends annual eye exams? Deferred If pt is not established with a provider, would they like to be referred to a provider to establish care? No .   Dental Screening:  Recommended annual dental exams for proper oral hygiene    Community Resource Referral / Chronic Care Management:  CRR required this visit?  No   CCM required this visit?  No     Plan:     I have personally reviewed and noted the following in the patient's chart:   Medical and social history Use of alcohol, tobacco or illicit drugs  Current medications and supplements including opioid prescriptions. Patient is not currently taking opioid prescriptions. Functional ability and status Nutritional status Physical activity Advanced directives List of other physicians Hospitalizations, surgeries, and ER visits in previous 12 months Vitals Screenings to include cognitive, depression, and falls Referrals and appointments  In addition, I have reviewed and discussed with patient certain preventive protocols, quality metrics, and best practice recommendations. A written personalized care plan for preventive services as well as general preventive health recommendations were provided to patient.     Tillie Rung, LPN   [id number]   After Visit Summary: Given   Nurse Notes: None

## 2023-08-24 ENCOUNTER — Other Ambulatory Visit: Payer: Self-pay | Admitting: Family Medicine

## 2023-08-28 ENCOUNTER — Other Ambulatory Visit: Payer: Self-pay | Admitting: Cardiovascular Disease

## 2023-09-05 ENCOUNTER — Ambulatory Visit (INDEPENDENT_AMBULATORY_CARE_PROVIDER_SITE_OTHER): Payer: Medicare Other

## 2023-09-05 DIAGNOSIS — I495 Sick sinus syndrome: Secondary | ICD-10-CM | POA: Diagnosis not present

## 2023-09-05 LAB — CUP PACEART REMOTE DEVICE CHECK
Battery Remaining Longevity: 55 mo
Battery Voltage: 2.96 V
Brady Statistic AP VP Percent: 55.86 %
Brady Statistic AP VS Percent: 31.05 %
Brady Statistic AS VP Percent: 3.25 %
Brady Statistic AS VS Percent: 9.84 %
Brady Statistic RA Percent Paced: 86.49 %
Brady Statistic RV Percent Paced: 59.11 %
Date Time Interrogation Session: 20240911005253
Implantable Lead Connection Status: 753985
Implantable Lead Connection Status: 753985
Implantable Lead Implant Date: 20181205
Implantable Lead Implant Date: 20181205
Implantable Lead Location: 753859
Implantable Lead Location: 753860
Implantable Lead Model: 5076
Implantable Lead Model: 5076
Implantable Pulse Generator Implant Date: 20181205
Lead Channel Impedance Value: 247 Ohm
Lead Channel Impedance Value: 247 Ohm
Lead Channel Impedance Value: 342 Ohm
Lead Channel Impedance Value: 399 Ohm
Lead Channel Pacing Threshold Amplitude: 0.625 V
Lead Channel Pacing Threshold Amplitude: 1.125 V
Lead Channel Pacing Threshold Pulse Width: 0.4 ms
Lead Channel Pacing Threshold Pulse Width: 0.4 ms
Lead Channel Sensing Intrinsic Amplitude: 1.75 mV
Lead Channel Sensing Intrinsic Amplitude: 1.75 mV
Lead Channel Sensing Intrinsic Amplitude: 6.5 mV
Lead Channel Sensing Intrinsic Amplitude: 6.5 mV
Lead Channel Setting Pacing Amplitude: 1.5 V
Lead Channel Setting Pacing Amplitude: 2.5 V
Lead Channel Setting Pacing Pulse Width: 0.4 ms
Lead Channel Setting Sensing Sensitivity: 0.9 mV
Zone Setting Status: 755011
Zone Setting Status: 755011

## 2023-09-18 NOTE — Progress Notes (Signed)
Remote pacemaker transmission.   

## 2023-09-29 ENCOUNTER — Other Ambulatory Visit: Payer: Self-pay | Admitting: Cardiovascular Disease

## 2023-11-12 ENCOUNTER — Other Ambulatory Visit: Payer: Self-pay | Admitting: Family Medicine

## 2023-12-04 ENCOUNTER — Other Ambulatory Visit: Payer: Self-pay | Admitting: Family Medicine

## 2023-12-05 ENCOUNTER — Ambulatory Visit (INDEPENDENT_AMBULATORY_CARE_PROVIDER_SITE_OTHER): Payer: Medicare Other

## 2023-12-05 DIAGNOSIS — I495 Sick sinus syndrome: Secondary | ICD-10-CM | POA: Diagnosis not present

## 2023-12-05 LAB — CUP PACEART REMOTE DEVICE CHECK
Battery Remaining Longevity: 47 mo
Battery Voltage: 2.95 V
Brady Statistic AP VP Percent: 64.85 %
Brady Statistic AP VS Percent: 24.68 %
Brady Statistic AS VP Percent: 1.87 %
Brady Statistic AS VS Percent: 8.61 %
Brady Statistic RA Percent Paced: 89.47 %
Brady Statistic RV Percent Paced: 66.67 %
Date Time Interrogation Session: 20241210234407
Implantable Lead Connection Status: 753985
Implantable Lead Connection Status: 753985
Implantable Lead Implant Date: 20181205
Implantable Lead Implant Date: 20181205
Implantable Lead Location: 753859
Implantable Lead Location: 753860
Implantable Lead Model: 5076
Implantable Lead Model: 5076
Implantable Pulse Generator Implant Date: 20181205
Lead Channel Impedance Value: 247 Ohm
Lead Channel Impedance Value: 266 Ohm
Lead Channel Impedance Value: 342 Ohm
Lead Channel Impedance Value: 399 Ohm
Lead Channel Pacing Threshold Amplitude: 0.625 V
Lead Channel Pacing Threshold Amplitude: 1.125 V
Lead Channel Pacing Threshold Pulse Width: 0.4 ms
Lead Channel Pacing Threshold Pulse Width: 0.4 ms
Lead Channel Sensing Intrinsic Amplitude: 1.375 mV
Lead Channel Sensing Intrinsic Amplitude: 1.375 mV
Lead Channel Sensing Intrinsic Amplitude: 5.75 mV
Lead Channel Sensing Intrinsic Amplitude: 5.75 mV
Lead Channel Setting Pacing Amplitude: 1.5 V
Lead Channel Setting Pacing Amplitude: 2.5 V
Lead Channel Setting Pacing Pulse Width: 0.4 ms
Lead Channel Setting Sensing Sensitivity: 0.9 mV
Zone Setting Status: 755011
Zone Setting Status: 755011

## 2024-01-11 NOTE — Progress Notes (Signed)
Remote pacemaker transmission.   

## 2024-01-27 ENCOUNTER — Other Ambulatory Visit: Payer: Self-pay | Admitting: Cardiovascular Disease

## 2024-01-30 ENCOUNTER — Ambulatory Visit: Payer: Medicare Other | Admitting: Family Medicine

## 2024-01-31 ENCOUNTER — Encounter: Payer: Self-pay | Admitting: Cardiovascular Disease

## 2024-01-31 ENCOUNTER — Ambulatory Visit: Payer: Medicare Other | Attending: Cardiovascular Disease | Admitting: Cardiovascular Disease

## 2024-01-31 VITALS — BP 122/62 | HR 64 | Ht 65.0 in | Wt 126.8 lb

## 2024-01-31 DIAGNOSIS — D6869 Other thrombophilia: Secondary | ICD-10-CM | POA: Insufficient documentation

## 2024-01-31 DIAGNOSIS — I10 Essential (primary) hypertension: Secondary | ICD-10-CM | POA: Insufficient documentation

## 2024-01-31 DIAGNOSIS — Z950 Presence of cardiac pacemaker: Secondary | ICD-10-CM | POA: Insufficient documentation

## 2024-01-31 DIAGNOSIS — I484 Atypical atrial flutter: Secondary | ICD-10-CM | POA: Diagnosis not present

## 2024-01-31 DIAGNOSIS — D649 Anemia, unspecified: Secondary | ICD-10-CM | POA: Insufficient documentation

## 2024-01-31 DIAGNOSIS — I4719 Other supraventricular tachycardia: Secondary | ICD-10-CM | POA: Diagnosis not present

## 2024-01-31 DIAGNOSIS — I480 Paroxysmal atrial fibrillation: Secondary | ICD-10-CM | POA: Insufficient documentation

## 2024-01-31 DIAGNOSIS — D696 Thrombocytopenia, unspecified: Secondary | ICD-10-CM | POA: Insufficient documentation

## 2024-01-31 DIAGNOSIS — I495 Sick sinus syndrome: Secondary | ICD-10-CM | POA: Diagnosis not present

## 2024-01-31 DIAGNOSIS — E785 Hyperlipidemia, unspecified: Secondary | ICD-10-CM | POA: Insufficient documentation

## 2024-01-31 NOTE — Patient Instructions (Addendum)
Medication Instructions:   No changes  *If you need a refill on your cardiac medications before your next appointment, please call your pharmacy*   Lab Work: Not needed    Testing/Procedures:  Not  needed  Follow-Up: At [facility], you and your health needs are our priority.  As part of our continuing mission to provide you with exceptional heart care, we have created designated Provider Care Teams.  These Care Teams include your primary Cardiologist (physician) and Advanced Practice Providers (APPs -  Physician Assistants and Nurse Practitioners) who all work together to provide you with the care you need, when you need it.     Your next appointment:   12 month(s)  The format for your next appointment:   In Person  Provider:   Mihai Croitoru, MD    

## 2024-01-31 NOTE — Progress Notes (Signed)
 Cardiology Office Note:    Date:  01/31/2024   ID:  Joshua Burns, DOB 02/21/1925, MRN [id number]  PCP:  Micheal Wolm ORN, MD  Cardiologist:  Jerel Balding, MD    Referring MD: No ref. provider found   No chief complaint on file.    History of Present Illness:    Joshua Burns is a 89 y.o. male with a hx of syncope due to sinus pauses,  paroxysmal atrial fibrillation with rapid ventricular response s/p dual-chamber permanent pacemaker implantation in December 2018 ([employer] Azure MRI conditional).  He has not had any syncope since pacemaker implantation.  He has never had stroke/TIA or other embolic events.    He has had an uneventful year.  He has not had any falls or any bleeding problems.  He has not had syncope.  He denies palpitations.  He is less active but still walks around the house.  He denies shortness of breath or chest pain, lower extremity edema, orthopnea, PND or claudication.  Over the last 12 months his device has recorded 2 episodes of sustained atrial arrhythmia.  In July he had a 1 hour episode of paroxysmal atrial fibrillation and in November he had a 3-hour episode of paroxysmal atrial flutter.  Both of these events are well rate controlled and asymptomatic.  Otherwise pacemaker function is normal.  Generator longevity is estimated at 3.7 years.  All lead parameters are tested and found to be unchanged.  He has 88% atrial pacing and 63% ventricular pacing.  Activity 0.7 hours/day.  Other than the 2 episodes of sustained atrial fibrillation/flutter he has very frequent but extremely brief episodes of paroxysmal atrial tachycardia.  The overall burden of atrial arrhythmia remains less than 1%.  The heart rate histogram distribution is favorable, appropriate for his activity level.  His dementia is progressing very slowly but remains mild.  He remains interactive.  He has well treated hypertension and hyperlipidemia.  He continues to have mild anemia and mild  thrombocytopenia, stable over the last several years.  His dose of Eliquis  is adjusted for his small body size and age.   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    Past Surgical History:  Procedure Laterality Date   ANTERIOR CERVICAL DECOMP/DISCECTOMY FUSION  02/2003   thelbert 05/08/2011   BRAIN SURGERY     CARDIOVASCULAR STRESS TEST  08-04-2008   EF 0%   CATARACT EXTRACTION W/ INTRAOCULAR LENS  IMPLANT, BILATERAL Bilateral    CORONARY ANGIOPLASTY  01/2003   thelbert 05/08/2011   INGUINAL HERNIA REPAIR Right 03/2004   thelbert 05/08/2011   INSERT / REPLACE / REMOVE PACEMAKER  11/28/2017   KNEE ARTHROSCOPY Left 09/2011   thelbert 09/26/2011   LOOP RECORDER INSERTION N/A 08/20/2017   Procedure: LOOP RECORDER INSERTION;  Surgeon: Balding Jerel, MD;  Location: [facility];  Service: Cardiovascular;  Laterality: N/A;  LOOP RECORDER REMOVAL  11/28/2017   LOOP RECORDER REMOVAL N/A 11/28/2017   Procedure: LOOP RECORDER REMOVAL;  Surgeon: Francyne Headland, MD;  Location: [facility];  Service: Cardiovascular;  Laterality: N/A;   PACEMAKER IMPLANT N/A 11/28/2017   Procedure: PACEMAKER IMPLANT - DUAL CHAMBER;  Surgeon: Francyne Headland, MD;  Location: [facility];  Service: Cardiovascular;  Laterality: N/A;   PROSTATE BIOPSY  03/2008   US  ECHOCARDIOGRAPHY  08-04-2008   Est EF 55-60%    Current  Medications: Current Meds  Medication Sig   donepezil  (ARICEPT ) 10 MG tablet TAKE 1 TABLET BY MOUTH EVERYDAY AT BEDTIME   ELIQUIS  2.5 MG TABS tablet TAKE 1 TABLET BY MOUTH TWICE A DAY   hydrochlorothiazide  (HYDRODIURIL ) 12.5 MG tablet TAKE 1 TABLET BY MOUTH EVERY DAY   methocarbamol  (ROBAXIN ) 500 MG tablet Take 1 tablet (500 mg total) by mouth 2 (two) times daily.   metoprolol  tartrate (LOPRESSOR ) 25 MG tablet TAKE 1 TABLET BY MOUTH TWICE A DAY   Multiple Vitamin (MULTIVITAMIN WITH MINERALS) TABS tablet Take 1 tablet by mouth daily.   potassium chloride  SA (KLOR-CON  M20) 20 MEQ tablet TAKE 1 TABLET BY MOUTH DAILY. NEEDS OFFICE VISIT FOR FURTHER REFILLS CALL [phone]   simvastatin  (ZOCOR ) 20 MG tablet TAKE 1 TABLET BY MOUTH EVERY DAY IN THE EVENING     Allergies:   Ace inhibitors and Diltiazem    Family History: The patient's family history includes Aneurysm in his brother; Heart disease in his father and mother. ROS:   Please see the history of present illness.    All other systems are reviewed and are negative  EKGs/Labs/Other Studies Reviewed:    EKG:    EKG Interpretation Date/Time:  Thursday January 31 2024 08:20:20 EST Ventricular Rate:  64 PR Interval:  260 QRS Duration:  70 QT Interval:  410 QTC Calculation: 422 R Axis:   18  Text Interpretation: Atrial-paced rhythm with prolonged AV conduction ST & T wave abnormality, consider inferior ischemia ST & T wave abnormality, consider anterolateral ischemia When compared with ECG of 29-Nov-2017 04:55, Non-specific change in ST segment in Inferior leads ST now depressed in Anterior leads T wave inversion now evident in Inferior leads T wave inversion now evident in Anterolateral leads Confirmed by Nusaybah Ivie [phone]) on 01/31/2024 8:26:17 AM      When compared with his most recent ECG from 2024 for the repolarization changes are not much different and are most likely secondary to LVH   Recent Labs: 02/02/2023: ALT 9; BUN  13; Creatinine, Ser 1.49; Potassium 4.0; Sodium 139 04/24/2023: Hemoglobin 10.0; Platelet Count 111  Recent Lipid Panel    Component Value Date/Time   CHOL 109 01/09/2023 1119   TRIG 66.0 01/09/2023 1119   HDL 34.50 (L) 01/09/2023 1119   CHOLHDL 3 01/09/2023 1119   VLDL 13.2 01/09/2023 1119   LDLCALC 62 01/09/2023 1119    Physical Exam:    VS:  BP 122/62   Pulse 64   Ht 5' 5 (1.651 m)   Wt 126 lb 12.8 oz (57.5 kg)   SpO2 99%   BMI 21.10 kg/m     Wt Readings from Last 3 Encounters:  01/31/24 126 lb 12.8 oz (57.5 kg)  08/17/23 130 lb 9.6 oz (59.2 kg)  04/24/23 132 lb 9.6 oz (60.1 kg)     General: Alert, oriented x3, no distress, healthy pacemaker site in left subclavian area Head: no evidence of trauma, PERRL, EOMI, no exophtalmos or lid lag,  no myxedema, no xanthelasma; normal ears, nose and oropharynx Neck: normal jugular venous pulsations and no hepatojugular reflux; brisk carotid pulses without delay and no carotid bruits Chest: clear to auscultation, no signs of consolidation by percussion or palpation, normal fremitus, symmetrical and full respiratory excursions Cardiovascular: normal position and quality of the apical impulse, regular rhythm, normal first and second heart sounds, no murmurs, rubs or gallops Abdomen: no tenderness or distention, no masses by palpation, no abnormal pulsatility or arterial bruits, normal bowel sounds, no hepatosplenomegaly Extremities: no clubbing, cyanosis or edema; 2+ radial, ulnar and brachial pulses bilaterally; 2+ right femoral, posterior tibial and dorsalis pedis pulses; 2+ left femoral, posterior tibial and dorsalis pedis pulses; no subclavian or femoral bruits Neurological: grossly nonfocal Psych: Normal mood and affect     ASSESSMENT:    1. Paroxysmal atrial fibrillation (HCC)   2. Atypical atrial flutter (HCC)   3. PAT (paroxysmal atrial tachycardia) (HCC)   4. Acquired thrombophilia (HCC)   5. Sick sinus syndrome (HCC)    6. Tachycardia-bradycardia syndrome (HCC)   7. Pacemaker   8. Dyslipidemia (high LDL; low HDL)   9. Essential hypertension   10. Anemia, unspecified type   11. Thrombocytopenia (HCC)      PLAN:    In order of problems listed above:  PAT/AFlutter/AFib: He had an episode of atrial flutter lasting for about 3 hours, mid November 2024) and had a single 1 hour episode of paroxysmal atrial fibrillation in June 2024, both with good ventricular rate control, the overall burden of arrhythmia remains very low at less than 0.1%. Episodes are very frequent, but are also quite brief and appear to be asymptomatic.  CHADSVasc 3 (age 62, HTN).  Since the burden of arrhythmia remains so low, and age 15 I would have a very low threshold to discontinue his anticoagulant should he develop bleeding issues or falls. Anticoagulation: No bleeding issues.  Anemia is likely not related to chronic blood loss since he has normal iron studies.   SSS/Tachy-brady: Heart rate histogram distribution suggests appropriate atrial pacing sensor based rates. 2nd deg AVB: Frequently needs ventricular pacing.  Thankfully this has not been associated with any signs of heart failure. PPM: Normal device function on comprehensive testing today.  No changes necessary.  Continue remote downloads every 3 months. NSVT: rare and asymptomatic HLP: Chronically low HDL, excellent LDL on simvastatin .  Continue same medications. HTN: Often has situational hypertension, blood pressure normal when retested. Anemia: Stable mild anemia, normocytic normochromic and mild thrombocytopenia.  No overt bleeding problems.  May have a mild myelodysplastic syndrome.  Patient Instructions  Medication Instructions:  No changes  *If you need a refill on your cardiac medications before your next appointment, please call your pharmacy*   Lab Work: Not needed   Testing/Procedures: Not needed   Follow-Up: At [facility], you and your health needs  are our priority.  As part of our continuing mission to provide you with exceptional heart care, we have created designated Provider Care Teams.  These Care Teams include your primary Cardiologist (physician) and Advanced Practice Providers (APPs -  Physician Assistants and Nurse Practitioners) who all work together to provide you with the care you need, when you need it.     Your next appointment:   12 month(s)  The format for your next appointment:   In Person  Provider:   Jerel Balding, MD       Medication Adjustments/Labs and Tests Ordered: Current medicines are reviewed at length with  the patient today.  Concerns regarding medicines are outlined above.  Orders Placed This Encounter  Procedures   EKG 12-Lead    No orders of the defined types were placed in this encounter.    Signed, Jerel Balding, MD  01/31/2024 9:26 AM    [facility]

## 2024-02-01 ENCOUNTER — Encounter: Payer: Self-pay | Admitting: Family Medicine

## 2024-02-01 ENCOUNTER — Ambulatory Visit (INDEPENDENT_AMBULATORY_CARE_PROVIDER_SITE_OTHER): Payer: Medicare Other | Admitting: Family Medicine

## 2024-02-01 VITALS — BP 136/60 | HR 76 | Temp 97.4°F | Wt 128.1 lb

## 2024-02-01 DIAGNOSIS — D649 Anemia, unspecified: Secondary | ICD-10-CM | POA: Insufficient documentation

## 2024-02-01 DIAGNOSIS — E7800 Pure hypercholesterolemia, unspecified: Secondary | ICD-10-CM

## 2024-02-01 DIAGNOSIS — N1831 Chronic kidney disease, stage 3a: Secondary | ICD-10-CM | POA: Diagnosis not present

## 2024-02-01 DIAGNOSIS — I10 Essential (primary) hypertension: Secondary | ICD-10-CM | POA: Diagnosis not present

## 2024-02-01 DIAGNOSIS — N183 Chronic kidney disease, stage 3 unspecified: Secondary | ICD-10-CM | POA: Insufficient documentation

## 2024-02-01 LAB — CBC WITH DIFFERENTIAL/PLATELET
Basophils Absolute: 0 10*3/uL (ref 0.0–0.1)
Basophils Relative: 1.1 % (ref 0.0–3.0)
Eosinophils Absolute: 0 10*3/uL (ref 0.0–0.7)
Eosinophils Relative: 0.7 % (ref 0.0–5.0)
HCT: 30.1 % — ABNORMAL LOW (ref 39.0–52.0)
Hemoglobin: 10.1 g/dL — ABNORMAL LOW (ref 13.0–17.0)
Lymphocytes Relative: 34.7 % (ref 12.0–46.0)
Lymphs Abs: 1.6 10*3/uL (ref 0.7–4.0)
MCHC: 33.7 g/dL (ref 30.0–36.0)
MCV: 87.4 fL (ref 78.0–100.0)
Monocytes Absolute: 0.6 10*3/uL (ref 0.1–1.0)
Monocytes Relative: 13.5 % — ABNORMAL HIGH (ref 3.0–12.0)
Neutro Abs: 2.3 10*3/uL (ref 1.4–7.7)
Neutrophils Relative %: 50 % (ref 43.0–77.0)
Platelets: 85 10*3/uL — ABNORMAL LOW (ref 150.0–400.0)
RBC: 3.44 Mil/uL — ABNORMAL LOW (ref 4.22–5.81)
RDW: 15.7 % — ABNORMAL HIGH (ref 11.5–15.5)
WBC: 4.6 10*3/uL (ref 4.0–10.5)

## 2024-02-01 LAB — COMPREHENSIVE METABOLIC PANEL
ALT: 11 U/L (ref 0–53)
AST: 17 U/L (ref 0–37)
Albumin: 4.8 g/dL (ref 3.5–5.2)
Alkaline Phosphatase: 62 U/L (ref 39–117)
BUN: 17 mg/dL (ref 6–23)
CO2: 28 meq/L (ref 19–32)
Calcium: 9.8 mg/dL (ref 8.4–10.5)
Chloride: 104 meq/L (ref 96–112)
Creatinine, Ser: 1.77 mg/dL — ABNORMAL HIGH (ref 0.40–1.50)
GFR: 31.92 mL/min — ABNORMAL LOW (ref >60.00)
Glucose, Bld: 103 mg/dL — ABNORMAL HIGH (ref 70–99)
Potassium: 3.7 meq/L (ref 3.5–5.1)
Sodium: 145 meq/L (ref 135–145)
Total Bilirubin: 0.6 mg/dL (ref 0.2–1.2)
Total Protein: 8 g/dL (ref 6.0–8.3)

## 2024-02-01 LAB — LIPID PANEL
Cholesterol: 122 mg/dL (ref 0–200)
HDL: 46.9 mg/dL (ref >39.00)
LDL Cholesterol: 58 mg/dL (ref 0–99)
NonHDL: 74.94
Total CHOL/HDL Ratio: 3
Triglycerides: 84 mg/dL (ref 0.0–149.0)
VLDL: 16.8 mg/dL (ref 0.0–40.0)

## 2024-02-01 MED ORDER — DONEPEZIL HCL 10 MG PO TABS: ORAL_TABLET | ORAL | 3 refills | Status: AC

## 2024-02-01 MED ORDER — METOPROLOL TARTRATE 25 MG PO TABS: 25.0000 mg | ORAL_TABLET | Freq: Two times a day (BID) | ORAL | 3 refills | Status: AC

## 2024-02-01 MED ORDER — POTASSIUM CHLORIDE CRYS ER 20 MEQ PO TBCR: EXTENDED_RELEASE_TABLET | ORAL | 3 refills | Status: AC

## 2024-02-01 NOTE — Progress Notes (Signed)
 Established Patient Office Visit  Subjective   Patient ID: Joshua Burns, male    DOB: 11-05-1925  Age: 89 y.o. MRN: [id number]  Chief Complaint  Patient presents with   Medical Management of Chronic Issues    HPI   Joshua Burns is here today for medical follow-up.  He just saw cardiology yesterday.  He has history of CAD, hypertension, history of A-fib, chronic kidney disease stage III, chronic normocytic anemia, dementia, hyperlipidemia.  Generally doing well.  Has lost a few pounds from last visit.  Eating 2 meals per day and occasionally supplements with boost or snack.  No specific complaints.  Will turn 89 in a couple of weeks.  No recent falls.  His cardiologist had suggested hematology follow-up last year for anemia but they never went.  B12 and iron studies were normal.  He has had chronic mild thrombocytopenia.  Needs refills of several medications today including Aricept , potassium, and Lopressor .  Also needs follow-up labs.  They also bring in forms to complete for handicap sticker  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   Past Surgical History:  Procedure Laterality Date   ANTERIOR CERVICAL DECOMP/DISCECTOMY FUSION  02/2003   thelbert 05/08/2011   BRAIN SURGERY     CARDIOVASCULAR STRESS TEST  08-04-2008   EF 0%   CATARACT EXTRACTION W/ INTRAOCULAR LENS  IMPLANT, BILATERAL Bilateral     CORONARY ANGIOPLASTY  01/2003   thelbert 05/08/2011   INGUINAL HERNIA REPAIR Right 03/2004   thelbert 05/08/2011   INSERT / REPLACE / REMOVE PACEMAKER  11/28/2017   KNEE ARTHROSCOPY Left 09/2011   thelbert 09/26/2011   LOOP RECORDER INSERTION N/A 08/20/2017   Procedure: LOOP RECORDER INSERTION;  Surgeon: Francyne Headland, MD;  Location: [facility];  Service: Cardiovascular;  Laterality: N/A;   LOOP RECORDER REMOVAL  11/28/2017   LOOP RECORDER REMOVAL N/A 11/28/2017   Procedure: LOOP RECORDER REMOVAL;  Surgeon: Francyne Headland, MD;  Location: [facility];  Service: Cardiovascular;  Laterality: N/A;   PACEMAKER IMPLANT N/A 11/28/2017   Procedure: PACEMAKER IMPLANT - DUAL CHAMBER;  Surgeon: Francyne Headland, MD;  Location: [facility];  Service: Cardiovascular;  Laterality: N/A;   PROSTATE BIOPSY  03/2008   US  ECHOCARDIOGRAPHY  08-04-2008   Est EF 55-60%    reports that he quit smoking about 45 years ago. His smoking use included cigarettes. He started smoking about 83 years ago. He has a 38 pack-year smoking history. He has never used smokeless tobacco. He reports that he does not drink alcohol and does not use drugs. family history includes Aneurysm in his brother; Heart disease in his father and mother. Allergies  Allergen Reactions   Ace Inhibitors Swelling   Diltiazem  Swelling and Palpitations    Review of Systems  Constitutional:  Negative for chills, fever and malaise/fatigue.  Eyes:  Negative  for blurred vision.  Respiratory:  Negative for shortness of breath.   Cardiovascular:  Negative for chest pain.  Neurological:  Negative for dizziness, weakness and headaches.      Objective:     BP 136/60 (BP Location: Right Arm, Patient Position: Sitting, Cuff Size: Normal)   Pulse 76   Temp (!) 97.4 F (36.3 C) (Oral)   Wt 128 lb 1.6 oz (58.1 kg)   SpO2 96%   BMI 21.32 kg/m  BP Readings from Last 3 Encounters:  02/01/24 136/60  01/31/24 122/62  08/17/23 120/62    Wt Readings from Last 3 Encounters:  02/01/24 128 lb 1.6 oz (58.1 kg)  01/31/24 126 lb 12.8 oz (57.5 kg)  08/17/23 130 lb 9.6 oz (59.2 kg)      Physical Exam Vitals reviewed.  Constitutional:      Appearance: He is well-developed.  Eyes:     Pupils: Pupils are equal, round, and reactive to light.  Neck:     Thyroid : No thyromegaly.  Cardiovascular:     Rate and Rhythm: Normal rate and regular rhythm.  Pulmonary:     Effort: Pulmonary effort is normal. No respiratory distress.     Breath sounds: Normal breath sounds. No wheezing or rales.  Musculoskeletal:     Cervical back: Neck supple.     Right lower leg: No edema.     Left lower leg: No edema.  Neurological:     General: No focal deficit present.     Mental Status: He is alert.      No results found for any visits on 02/01/24.  Last CBC Lab Results  Component Value Date   WBC 5.6 04/24/2023   HGB 10.0 (L) 04/24/2023   HCT 31.4 (L) 04/24/2023   MCV 90.8 04/24/2023   MCH 28.9 04/24/2023   RDW 14.4 04/24/2023   PLT 111 (L) 04/24/2023   Last metabolic panel Lab Results  Component Value Date   GLUCOSE 113 (H) 02/02/2023   NA 139 02/02/2023   K 4.0 02/02/2023   CL 101 02/02/2023   CO2 28 02/02/2023   BUN 13 02/02/2023   CREATININE 1.49 (H) 02/02/2023   GFRNONAA 43 (L) 02/02/2023   CALCIUM 9.7 02/02/2023   PROT 7.7 02/02/2023   ALBUMIN 4.5 02/02/2023   LABGLOB 2.8 08/20/2017   BILITOT 0.5 02/02/2023   ALKPHOS 58 02/02/2023   AST 15 02/02/2023   ALT 9 02/02/2023   ANIONGAP 10 02/02/2023   Last lipids Lab Results  Component Value Date   CHOL 109 01/09/2023   HDL 34.50 (L) 01/09/2023   LDLCALC 62 01/09/2023   TRIG 66.0 01/09/2023   CHOLHDL 3 01/09/2023   Last thyroid  functions Lab Results  Component Value Date   TSH 1.276 08/18/2017   Last vitamin B12 and Folate Lab Results  Component Value Date   VITAMINB12 >2000 (H) 02/01/2023   FOLATE >20.0 02/01/2023      The ASCVD Risk score  (Arnett DK, et al., 2019) failed to calculate for the following reasons:   The 2019 ASCVD risk score is only valid for ages 70 to 93    Assessment & Plan:   Problem List Items Addressed This Visit       Unprioritized   CKD (chronic kidney disease) stage 3, GFR 30-59 ml/min (HCC)   Relevant Orders   CMP   Chronic anemia   Relevant Orders   CBC with Differential/Platelet   Essential hypertension   Relevant Medications   metoprolol  tartrate (  LOPRESSOR ) 25 MG tablet   Hyperlipidemia - Primary   Relevant Medications   metoprolol  tartrate (LOPRESSOR ) 25 MG tablet   Other Relevant Orders   Lipid panel   CMP  Almost 89 year old male with multiple chronic problems as above.  Overall stable.  Refill several medications today including Aricept , Lopressor , and potassium.  Set up follow-up labs with CBC, CMP, lipid panel.  Flu vaccine already given. Forms completed for handicap placard Follow-up in 1 year and sooner as needed  No follow-ups on file.    Wolm Scarlet, MD

## 2024-02-11 ENCOUNTER — Encounter: Payer: Self-pay | Admitting: Family Medicine

## 2024-03-05 ENCOUNTER — Ambulatory Visit: Payer: Medicare Other | Attending: Cardiovascular Disease

## 2024-03-05 DIAGNOSIS — I495 Sick sinus syndrome: Secondary | ICD-10-CM | POA: Diagnosis not present

## 2024-03-05 LAB — CUP PACEART REMOTE DEVICE CHECK
Battery Remaining Longevity: 41 mo
Battery Voltage: 2.94 V
Brady Statistic AP VP Percent: 76.46 %
Brady Statistic AP VS Percent: 9.55 %
Brady Statistic AS VP Percent: 6.09 %
Brady Statistic AS VS Percent: 7.9 %
Brady Statistic RA Percent Paced: 86.29 %
Brady Statistic RV Percent Paced: 82.54 %
Date Time Interrogation Session: 20250312005426
Implantable Lead Connection Status: 753985
Implantable Lead Connection Status: 753985
Implantable Lead Implant Date: 20181205
Implantable Lead Implant Date: 20181205
Implantable Lead Location: 753859
Implantable Lead Location: 753860
Implantable Lead Model: 5076
Implantable Lead Model: 5076
Implantable Pulse Generator Implant Date: 20181205
Lead Channel Impedance Value: 266 Ohm
Lead Channel Impedance Value: 266 Ohm
Lead Channel Impedance Value: 361 Ohm
Lead Channel Impedance Value: 418 Ohm
Lead Channel Pacing Threshold Amplitude: 0.625 V
Lead Channel Pacing Threshold Amplitude: 1.125 V
Lead Channel Pacing Threshold Pulse Width: 0.4 ms
Lead Channel Pacing Threshold Pulse Width: 0.4 ms
Lead Channel Sensing Intrinsic Amplitude: 1.5 mV
Lead Channel Sensing Intrinsic Amplitude: 1.5 mV
Lead Channel Sensing Intrinsic Amplitude: 6.125 mV
Lead Channel Sensing Intrinsic Amplitude: 6.125 mV
Lead Channel Setting Pacing Amplitude: 1.5 V
Lead Channel Setting Pacing Amplitude: 2.5 V
Lead Channel Setting Pacing Pulse Width: 0.4 ms
Lead Channel Setting Sensing Sensitivity: 0.9 mV
Zone Setting Status: 755011
Zone Setting Status: 755011

## 2024-04-21 NOTE — Progress Notes (Signed)
 Remote pacemaker transmission.

## 2024-06-04 ENCOUNTER — Ambulatory Visit (INDEPENDENT_AMBULATORY_CARE_PROVIDER_SITE_OTHER): Payer: Medicare Other

## 2024-06-04 DIAGNOSIS — I495 Sick sinus syndrome: Secondary | ICD-10-CM | POA: Diagnosis not present

## 2024-06-05 LAB — CUP PACEART REMOTE DEVICE CHECK
Battery Remaining Longevity: 38 mo
Battery Voltage: 2.94 V
Brady Statistic AP VP Percent: 77.66 %
Brady Statistic AP VS Percent: 10.08 %
Brady Statistic AS VP Percent: 5.54 %
Brady Statistic AS VS Percent: 6.72 %
Brady Statistic RA Percent Paced: 88.12 %
Brady Statistic RV Percent Paced: 83.2 %
Date Time Interrogation Session: 20250611005257
Implantable Lead Connection Status: 753985
Implantable Lead Connection Status: 753985
Implantable Lead Implant Date: 20181205
Implantable Lead Implant Date: 20181205
Implantable Lead Location: 753859
Implantable Lead Location: 753860
Implantable Lead Model: 5076
Implantable Lead Model: 5076
Implantable Pulse Generator Implant Date: 20181205
Lead Channel Impedance Value: 247 Ohm
Lead Channel Impedance Value: 266 Ohm
Lead Channel Impedance Value: 342 Ohm
Lead Channel Impedance Value: 418 Ohm
Lead Channel Pacing Threshold Amplitude: 0.625 V
Lead Channel Pacing Threshold Amplitude: 1 V
Lead Channel Pacing Threshold Pulse Width: 0.4 ms
Lead Channel Pacing Threshold Pulse Width: 0.4 ms
Lead Channel Sensing Intrinsic Amplitude: 1.625 mV
Lead Channel Sensing Intrinsic Amplitude: 1.625 mV
Lead Channel Sensing Intrinsic Amplitude: 5.75 mV
Lead Channel Sensing Intrinsic Amplitude: 5.75 mV
Lead Channel Setting Pacing Amplitude: 1.5 V
Lead Channel Setting Pacing Amplitude: 2.5 V
Lead Channel Setting Pacing Pulse Width: 0.4 ms
Lead Channel Setting Sensing Sensitivity: 0.9 mV
Zone Setting Status: 755011
Zone Setting Status: 755011

## 2024-06-18 ENCOUNTER — Ambulatory Visit: Payer: Self-pay | Admitting: Cardiovascular Disease

## 2024-07-30 NOTE — Addendum Note (Signed)
 Addended by: VICCI SELLER A on: 07/30/2024 01:47 PM   Modules accepted: Orders

## 2024-07-30 NOTE — Progress Notes (Signed)
 Remote pacemaker transmission.

## 2024-08-10 ENCOUNTER — Other Ambulatory Visit: Payer: Self-pay | Admitting: Family Medicine

## 2024-08-20 ENCOUNTER — Ambulatory Visit (INDEPENDENT_AMBULATORY_CARE_PROVIDER_SITE_OTHER): Payer: Medicare Other

## 2024-08-20 VITALS — BP 120/60 | HR 84 | Temp 98.4°F | Ht 63.0 in | Wt 131.5 lb

## 2024-08-20 DIAGNOSIS — Z0000 Encounter for general adult medical examination without abnormal findings: Secondary | ICD-10-CM

## 2024-08-20 NOTE — Patient Instructions (Addendum)
 Joshua Burns , Thank you for taking time out of your busy schedule to complete your Annual Wellness Visit with me. I enjoyed our conversation and look forward to speaking with you again next year. I, as well as your care team,  appreciate your ongoing commitment to your health goals. Please review the following plan we discussed and let me know if I can assist you in the future. Your Game plan/ To Do List    Referrals: If you haven't heard from the office you've been referred to, please reach out to them at the phone provided.   Follow up Visits: We will see or speak with you next year for your Next Medicare AWV with our clinical staff 08/26/25 @ 11:20a  Have you seen your provider in the last 6 months (3 months if uncontrolled diabetes)? Next appointment with provider 02/02/25 @ 9a  Clinician Recommendations:  Aim for 30 minutes of exercise or brisk walking, 6-8 glasses of water, and 5 servings of fruits and vegetables each day.       This is a list of the screenings recommended for you:  Health Maintenance  Topic Date Due   COVID-19 Vaccine (4 - 2024-25 season) 08/26/2023   Flu Shot  07/25/2024   Medicare Annual Wellness Visit  08/20/2025   DTaP/Tdap/Td vaccine (2 - Td or Tdap) 08/19/2027   Pneumococcal Vaccine for age over 81  Completed   Zoster (Shingles) Vaccine  Completed   HPV Vaccine  Aged Out   Meningitis B Vaccine  Aged Out    Advanced directives: (In Chart) A copy of your advanced directives are scanned into your chart should your provider ever need it. Advance Care Planning is important because it:  [x]  Makes sure you receive the medical care that is consistent with your values, goals, and preferences  [x]  It provides guidance to your family and loved ones and reduces their decisional burden about whether or not they are making the right decisions based on your wishes.  Follow the link provided in your after visit summary or read over the paperwork we have mailed to you to  help you started getting your Advance Directives in place. If you need assistance in completing these, please reach out to us  so that we can help you!  See attachments for Preventive Care and Fall Prevention Tips.

## 2024-08-20 NOTE — Progress Notes (Signed)
 Subjective:   Joshua Burns is a 89 y.o. who presents for a Medicare Wellness preventive visit.  As a reminder, Annual Wellness Visits don't include a physical exam, and some assessments may be limited, especially if this visit is performed virtually. We may recommend an in-person follow-up visit with your provider if needed.  Visit Complete: In person    Persons Participating in Visit: Patient.  AWV Questionnaire: No: Patient Medicare AWV questionnaire was not completed prior to this visit.  Cardiac Risk Factors include: advanced age (>54men, >53 women);male gender     Objective:    Today's Vitals   08/20/24 1113  BP: 120/60  Pulse: 84  Temp: 98.4 F (36.9 C)  TempSrc: Oral  SpO2: 99%  Weight: 131 lb 8 oz (59.6 kg)  Height: 5' 3 (1.6 m)   Body mass index is 23.29 kg/m.     08/20/2024   11:29 AM 08/17/2023   11:43 AM 04/24/2023   11:34 AM 02/21/2023   11:10 AM 02/01/2023   11:53 PM 08/14/2022   11:04 AM 07/22/2021    2:01 PM  Advanced Directives  Does Patient Have a Medical Advance Directive? Yes Yes Yes Yes Yes Yes Yes  Type of Estate agent of [place];Living will Healthcare Power of [place];Living will Living will;Healthcare Power of [employer] Power of [place];Living will Living will;Healthcare Power of [employer] Power of [place];Living will Healthcare Power of [place];Living will  Does patient want to make changes to medical advance directive? No - Patient declined No - Patient declined No - Patient declined No - Patient declined  No - Patient declined   Copy of Healthcare Power of Attorney in Chart? Yes - validated most recent copy scanned in chart (See row information) Yes - validated most recent copy scanned in chart (See row information)  No - copy requested No - copy requested Yes - validated most recent copy scanned in chart (See row information) No - copy requested    Current Medications (verified) Outpatient  Encounter Medications as of 08/20/2024  Medication Sig   donepezil  (ARICEPT ) 10 MG tablet TAKE 1 TABLET BY MOUTH EVERYDAY AT BEDTIME   ELIQUIS  2.5 MG TABS tablet TAKE 1 TABLET BY MOUTH TWICE A DAY   hydrochlorothiazide  (HYDRODIURIL ) 12.5 MG tablet TAKE 1 TABLET BY MOUTH EVERY DAY   metoprolol  tartrate (LOPRESSOR ) 25 MG tablet Take 1 tablet (25 mg total) by mouth 2 (two) times daily.   Multiple Vitamin (MULTIVITAMIN WITH MINERALS) TABS tablet Take 1 tablet by mouth daily.   potassium chloride  SA (KLOR-CON  M20) 20 MEQ tablet TAKE 1 TABLET BY MOUTH DAILY.   simvastatin  (ZOCOR ) 20 MG tablet TAKE 1 TABLET BY MOUTH EVERY DAY IN THE EVENING   No facility-administered encounter medications on file as of 08/20/2024.    Allergies (verified) Ace inhibitors and Diltiazem    History: Past Medical History:  Diagnosis Date   Alzheimer disease (HCC)    Arthritis    knees (11/28/2017)   BPH (benign prostatic hyperplasia) 01/28/2010   Qualifier: Diagnosis of  By: Arbutus Noon     CAD (coronary artery disease)    Cognitive impairment 12/05/2016   Glaucoma, both eyes    HYPERLIPIDEMIA 01/28/2010   HYPERTENSION 01/28/2010   HYPERTROPHY PROSTATE W/UR OBST & OTH LUTS 01/28/2010   Paroxysmal atrial fibrillation (HCC) 11/02/2017   Presence of permanent cardiac pacemaker 11/28/2017   Second degree Mobitz I AV block    Sick sinus syndrome (HCC) 08/18/2017   Syncope    Tachycardia-bradycardia  syndrome (HCC) 11/28/2017   Past Surgical History:  Procedure Laterality Date   ANTERIOR CERVICAL DECOMP/DISCECTOMY FUSION  02/2003   thelbert 05/08/2011   BRAIN SURGERY     CARDIOVASCULAR STRESS TEST  08-04-2008   EF 0%   CATARACT EXTRACTION W/ INTRAOCULAR LENS  IMPLANT, BILATERAL Bilateral    CORONARY ANGIOPLASTY  01/2003   thelbert 05/08/2011   INGUINAL HERNIA REPAIR Right 03/2004   thelbert 05/08/2011   INSERT / REPLACE / REMOVE PACEMAKER  11/28/2017   KNEE ARTHROSCOPY Left 09/2011   thelbert 09/26/2011   LOOP RECORDER  INSERTION N/A 08/20/2017   Procedure: LOOP RECORDER INSERTION;  Surgeon: Francyne Headland, MD;  Location: [facility];  Service: Cardiovascular;  Laterality: N/A;   LOOP RECORDER REMOVAL  11/28/2017   LOOP RECORDER REMOVAL N/A 11/28/2017   Procedure: LOOP RECORDER REMOVAL;  Surgeon: Francyne Headland, MD;  Location: [facility];  Service: Cardiovascular;  Laterality: N/A;   PACEMAKER IMPLANT N/A 11/28/2017   Procedure: PACEMAKER IMPLANT - DUAL CHAMBER;  Surgeon: Francyne Headland, MD;  Location: [facility];  Service: Cardiovascular;  Laterality: N/A;   PROSTATE BIOPSY  03/2008   US  ECHOCARDIOGRAPHY  08-04-2008   Est EF 55-60%   Family History  Problem Relation Age of Onset   Heart disease Mother    Heart disease Father    Aneurysm Brother    Social History   Socioeconomic History   Marital status: Widowed    Spouse name: Not on file   Number of children: 3   Years of education: Not on file   Highest education level: Not on file  Occupational History    Employer: [employer]  Tobacco Use   Smoking status: Former    Current packs/day: 0.00    Average packs/day: 1 pack/day for 38.0 years (38.0 ttl pk-yrs)    Types: Cigarettes    Start date: 12/25/1940    Quit date: 12/25/1978    Years since quitting: 45.6   Smokeless tobacco: Never  Vaping Use   Vaping status: Never Used  Substance and Sexual Activity   Alcohol use: No    Comment: does not    Drug use: No   Sexual activity: Not on file  Other Topics Concern   Not on file  Social History Narrative   Not on file   Social Drivers of Health   Financial Resource Strain: Low Risk  (08/20/2024)   Overall Financial Resource Strain (CARDIA)    Difficulty of Paying Living Expenses: Not hard at all  Food Insecurity: No Food Insecurity (08/20/2024)   Hunger Vital Sign    Worried About Running Out of Food in the Last Year: Never true    Ran Out of Food in the Last Year: Never true  Transportation Needs: No  Transportation Needs (08/20/2024)   PRAPARE - Administrator, Civil Service (Medical): No    Lack of Transportation (Non-Medical): No  Physical Activity: Insufficiently Active (08/20/2024)   Exercise Vital Sign    Days of Exercise per Week: 3 days    Minutes of Exercise per Session: 20 min  Stress: No Stress Concern Present (08/20/2024)   [employer] of Occupational Health - Occupational Stress Questionnaire    Feeling of Stress: Not at all  Social Connections: Moderately Integrated (08/20/2024)   Social Connection and Isolation Panel    Frequency of Communication with Friends and Family: More than three times a week    Frequency of Social Gatherings with  Friends and Family: More than three times a week    Attends Religious Services: More than 4 times per year    Active Member of Clubs or Organizations: Yes    Attends Banker Meetings: More than 4 times per year    Marital Status: Widowed    Tobacco Counseling Counseling given: Not Answered    Clinical Intake:  Pre-visit preparation completed: Yes  Pain : No/denies pain     BMI - recorded: 23.29 Nutritional Status: BMI of 19-24  Normal Nutritional Risks: None Diabetes: No  Lab Results  Component Value Date   HGBA1C 4.9 08/18/2017     How often do you need to have someone help you when you read instructions, pamphlets, or other written materials from your doctor or pharmacy?: 1 - Never  Interpreter Needed?: No  Information entered by :: Rojelio Blush LPN   Activities of Daily Living     08/20/2024   11:29 AM  In your present state of health, do you have any difficulty performing the following activities:  Hearing? 0  Vision? 0  Difficulty concentrating or making decisions? 0  Walking or climbing stairs? 0  Dressing or bathing? 0  Doing errands, shopping? 0  Preparing Food and eating ? N  Using the Toilet? N  In the past six months, have you accidently leaked urine? N  Do you  have problems with loss of bowel control? N  Managing your Medications? N  Managing your Finances? N  Housekeeping or managing your Housekeeping? N    Patient Care Team: Micheal Wolm ORN, MD as PCP - General (Family Medicine) Croitoru, Jerel, MD as PCP - Cardiology (Cardiology) [place], Peter M, MD (Cardiology)  I have updated your Care Teams any recent Medical Services you may have received from other providers in the past year.     Assessment:   This is a routine wellness examination for Joshua Burns.  Hearing/Vision screen Hearing Screening - Comments:: Denies hearing difficulties   Vision Screening - Comments:: Wears rx glasses - up to date with routine eye exams with  Deferred   Goals Addressed               This Visit's Progress     Remain active (pt-stated)         Depression Screen     08/20/2024   11:16 AM 08/17/2023   11:38 AM 01/09/2023   10:41 AM 08/14/2022   11:00 AM 07/22/2021    2:04 PM 07/22/2021    2:02 PM 07/22/2021    2:00 PM  PHQ 2/9 Scores  PHQ - 2 Score 0 0 0 0 0 0 0    Fall Risk     08/20/2024   11:29 AM 08/17/2023   11:41 AM 01/09/2023   10:38 AM 08/14/2022   11:03 AM 07/22/2021    2:02 PM  Fall Risk   Falls in the past year? 0 0 1 0 0  Number falls in past yr: 0 0 0 0 0  Injury with Fall? 0 0 0 0 0  Risk for fall due to : No Fall Risks No Fall Risks No Fall Risks No Fall Risks   Follow up Falls evaluation completed Falls prevention discussed Falls evaluation completed        Data saved with a previous flowsheet row definition    MEDICARE RISK AT HOME:  Medicare Risk at Home Any stairs in or around the home?: No If so, are there any without handrails?: No  Home free of loose throw rugs in walkways, pet beds, electrical cords, etc?: Yes Adequate lighting in your home to reduce risk of falls?: Yes Life alert?: No Use of a cane, walker or w/c?: No Grab bars in the bathroom?: No Shower chair or bench in shower?: No Elevated toilet seat or a  handicapped toilet?: No  TIMED UP AND GO:  Was the test performed?  Yes  Length of time to ambulate 10 feet: 10 sec Gait slow and steady without use of assistive device  Cognitive Function: 6CIT completed    12/26/2017   10:38 AM  MMSE - Mini Mental State Exam  Orientation to time 2   Orientation to time comments missed day, year and date   Orientation to Place 3   Orientation to Place-comments could not think of Dr.  Micheal name, did not know county   Registration 3   Attention/ Calculation 3   Recall 0   Language- name 2 objects 2   Language- repeat 1  Language- follow 3 step command 3   Language- read & follow direction 1   Write a sentence 1   Copy design 1   Total score 20      Data saved with a previous flowsheet row definition        08/20/2024   11:30 AM 08/17/2023   11:43 AM 08/14/2022   11:04 AM 07/20/2020    2:53 PM  6CIT Screen  What Year? 4 points 0 points 4 points 0 points  What month? 3 points 0 points 3 points 0 points  What time? 0 points 0 points 3 points 0 points  Count back from 20 0 points 0 points 0 points 0 points  Months in reverse 0 points 0 points 0 points 2 points  Repeat phrase 8 points 0 points 2 points 10 points  Total Score 15 points 0 points 12 points 12 points    Immunizations Immunization History  Administered Date(s) Administered   Fluad Quad(high Dose 65+) 09/30/2019, 10/14/2021, 09/05/2022   INFLUENZA, HIGH DOSE SEASONAL PF 09/20/2018   Influenza Split 09/19/2012   Influenza, Seasonal, Injecte, Preservative Fre 09/29/2013, 09/30/2014   Influenza,inj,Quad PF,6+ Mos 09/09/2015   Influenza-Unspecified 09/17/2016, 10/01/2017, 10/14/2021   MMR 03/27/2024   PFIZER(Purple Top)SARS-COV-2 Vaccination 12/09/2020, 12/15/2021   Pfizer Covid-19 Vaccine Bivalent Booster 32yrs & up 12/15/2021   Pneumococcal Conjugate-13 12/05/2016   Pneumococcal Polysaccharide-23 06/13/2012   Tdap 08/18/2017   Unspecified SARS-COV-2 Vaccination  09/25/2022   Zoster Recombinant(Shingrix) 10/14/2021, 09/25/2022   Zoster, Live 07/15/2012    Screening Tests Health Maintenance  Topic Date Due   COVID-19 Vaccine (4 - 2024-25 season) 08/26/2023   INFLUENZA VACCINE  07/25/2024   Medicare Annual Wellness (AWV)  08/20/2025   DTaP/Tdap/Td (2 - Td or Tdap) 08/19/2027   Pneumococcal Vaccine: 50+ Years  Completed   Zoster Vaccines- Shingrix  Completed   HPV VACCINES  Aged Out   Meningococcal B Vaccine  Aged Out    Health Maintenance  Health Maintenance Due  Topic Date Due   COVID-19 Vaccine (4 - 2024-25 season) 08/26/2023   INFLUENZA VACCINE  07/25/2024   Health Maintenance Items Addressed:   Additional Screening:  Vision Screening: Recommended annual ophthalmology exams for early detection of glaucoma and other disorders of the eye. Would you like a referral to an eye doctor? No    Dental Screening: Recommended annual dental exams for proper oral hygiene  Community Resource Referral / Chronic Care Management: CRR required this visit?  No   CCM required this visit?  No   Plan:    I have personally reviewed and noted the following in the patient's chart:   Medical and social history Use of alcohol, tobacco or illicit drugs  Current medications and supplements including opioid prescriptions. Patient is not currently taking opioid prescriptions. Functional ability and status Nutritional status Physical activity Advanced directives List of other physicians Hospitalizations, surgeries, and ER visits in previous 12 months Vitals Screenings to include cognitive, depression, and falls Referrals and appointments  In addition, I have reviewed and discussed with patient certain preventive protocols, quality metrics, and best practice recommendations. A written personalized care plan for preventive services as well as general preventive health recommendations were provided to patient.   Rojelio LELON Blush, LPN   [id number]    After Visit Summary: (In Person-Printed) AVS printed and given to the patient  Notes: Nothing significant to report at this time.

## 2024-08-22 ENCOUNTER — Telehealth: Payer: Self-pay

## 2024-08-22 NOTE — Telephone Encounter (Signed)
 Alert remote transmission: AT/AF >= 6 hr for 1 days. 3 AT/AF episodes, longest and most recent on 08/21/24 at 18:48 of ongoing duration, V rates > 100 bpm ~ 10-15%, Burden 0.6% since 06/04/24, on Eliquis  per Epic.   10 VHR and 1 Fast A&V episode, longest 1 min 37 sec on 08/21/24 at 18:46, EGMs c/w SVT and up to 13 beats of NSVT, avg V rates 154-200 bpm.   Patient has known AF on OAC and beta blocker.  Spoke with patient and daughter he is doing very well.  Actually reports he was out with daughter walking and very active yesterday which correlates with recorded events.  No changes in health, medications or diet and no missed doses of his medications.  Doing well.   Will continue to monitor.

## 2024-08-23 NOTE — Telephone Encounter (Signed)
 Can we please check again next week to see if still in AFib?

## 2024-08-29 ENCOUNTER — Ambulatory Visit

## 2024-08-29 NOTE — Telephone Encounter (Signed)
 1 week follow up transmission received.   Presenting rhythm AP/ VP.  Most recent episode of AF occurred on 08/21/24 lasting ~ 12 hours with controlled V rates.   Will forward to Dr. Francyne for review.

## 2024-08-29 NOTE — Telephone Encounter (Signed)
 Thank you.  I am glad that the AFib has stopped.  No change in medicines.

## 2024-09-01 LAB — CUP PACEART REMOTE DEVICE CHECK
Battery Remaining Longevity: 40 mo
Battery Voltage: 2.93 V
Brady Statistic AP VP Percent: 36.44 %
Brady Statistic AP VS Percent: 21.15 %
Brady Statistic AS VP Percent: 25.06 %
Brady Statistic AS VS Percent: 17.35 %
Brady Statistic RA Percent Paced: 58.09 %
Brady Statistic RV Percent Paced: 61.68 %
Date Time Interrogation Session: 20250905005220
Implantable Lead Connection Status: 753985
Implantable Lead Connection Status: 753985
Implantable Lead Implant Date: 20181205
Implantable Lead Implant Date: 20181205
Implantable Lead Location: 753859
Implantable Lead Location: 753860
Implantable Lead Model: 5076
Implantable Lead Model: 5076
Implantable Pulse Generator Implant Date: 20181205
Lead Channel Impedance Value: 247 Ohm
Lead Channel Impedance Value: 247 Ohm
Lead Channel Impedance Value: 323 Ohm
Lead Channel Impedance Value: 399 Ohm
Lead Channel Pacing Threshold Amplitude: 0.625 V
Lead Channel Pacing Threshold Amplitude: 1 V
Lead Channel Pacing Threshold Pulse Width: 0.4 ms
Lead Channel Pacing Threshold Pulse Width: 0.4 ms
Lead Channel Sensing Intrinsic Amplitude: 1.875 mV
Lead Channel Sensing Intrinsic Amplitude: 1.875 mV
Lead Channel Sensing Intrinsic Amplitude: 5.875 mV
Lead Channel Sensing Intrinsic Amplitude: 5.875 mV
Lead Channel Setting Pacing Amplitude: 1.5 V
Lead Channel Setting Pacing Amplitude: 2.5 V
Lead Channel Setting Pacing Pulse Width: 0.4 ms
Lead Channel Setting Sensing Sensitivity: 0.9 mV
Zone Setting Status: 755011
Zone Setting Status: 755011

## 2024-09-03 ENCOUNTER — Ambulatory Visit: Payer: Medicare Other

## 2024-09-03 DIAGNOSIS — I495 Sick sinus syndrome: Secondary | ICD-10-CM | POA: Diagnosis not present

## 2024-09-03 LAB — CUP PACEART REMOTE DEVICE CHECK
Battery Remaining Longevity: 39 mo
Battery Voltage: 2.94 V
Brady Statistic AP VP Percent: 37.07 %
Brady Statistic AP VS Percent: 47.04 %
Brady Statistic AS VP Percent: 2.09 %
Brady Statistic AS VS Percent: 13.8 %
Brady Statistic RA Percent Paced: 83.73 %
Brady Statistic RV Percent Paced: 39.16 %
Date Time Interrogation Session: 20250910003525
Implantable Lead Connection Status: 753985
Implantable Lead Connection Status: 753985
Implantable Lead Implant Date: 20181205
Implantable Lead Implant Date: 20181205
Implantable Lead Location: 753859
Implantable Lead Location: 753860
Implantable Lead Model: 5076
Implantable Lead Model: 5076
Implantable Pulse Generator Implant Date: 20181205
Lead Channel Impedance Value: 247 Ohm
Lead Channel Impedance Value: 247 Ohm
Lead Channel Impedance Value: 323 Ohm
Lead Channel Impedance Value: 418 Ohm
Lead Channel Pacing Threshold Amplitude: 0.625 V
Lead Channel Pacing Threshold Amplitude: 1 V
Lead Channel Pacing Threshold Pulse Width: 0.4 ms
Lead Channel Pacing Threshold Pulse Width: 0.4 ms
Lead Channel Sensing Intrinsic Amplitude: 0.375 mV
Lead Channel Sensing Intrinsic Amplitude: 0.375 mV
Lead Channel Sensing Intrinsic Amplitude: 6.25 mV
Lead Channel Sensing Intrinsic Amplitude: 6.25 mV
Lead Channel Setting Pacing Amplitude: 1.5 V
Lead Channel Setting Pacing Amplitude: 2.5 V
Lead Channel Setting Pacing Pulse Width: 0.4 ms
Lead Channel Setting Sensing Sensitivity: 0.9 mV
Zone Setting Status: 755011
Zone Setting Status: 755011

## 2024-09-07 ENCOUNTER — Ambulatory Visit: Payer: Self-pay | Admitting: Cardiovascular Disease

## 2024-09-12 NOTE — Progress Notes (Signed)
 Remote PPM Transmission

## 2024-11-05 ENCOUNTER — Other Ambulatory Visit: Payer: Self-pay | Admitting: Cardiovascular Disease

## 2024-12-03 ENCOUNTER — Ambulatory Visit: Payer: Medicare Other

## 2024-12-03 DIAGNOSIS — I495 Sick sinus syndrome: Secondary | ICD-10-CM | POA: Diagnosis not present

## 2024-12-04 LAB — CUP PACEART REMOTE DEVICE CHECK
Battery Remaining Longevity: 32 mo
Battery Voltage: 2.92 V
Brady Statistic AP VP Percent: 53.66 %
Brady Statistic AP VS Percent: 17.41 %
Brady Statistic AS VP Percent: 13.87 %
Brady Statistic AS VS Percent: 15.06 %
Brady Statistic RA Percent Paced: 70.92 %
Brady Statistic RV Percent Paced: 67.49 %
Date Time Interrogation Session: 20251209235252
Implantable Lead Connection Status: 753985
Implantable Lead Connection Status: 753985
Implantable Lead Implant Date: 20181205
Implantable Lead Implant Date: 20181205
Implantable Lead Location: 753859
Implantable Lead Location: 753860
Implantable Lead Model: 5076
Implantable Lead Model: 5076
Implantable Pulse Generator Implant Date: 20181205
Lead Channel Impedance Value: 266 Ohm
Lead Channel Impedance Value: 266 Ohm
Lead Channel Impedance Value: 342 Ohm
Lead Channel Impedance Value: 399 Ohm
Lead Channel Pacing Threshold Amplitude: 0.625 V
Lead Channel Pacing Threshold Amplitude: 1 V
Lead Channel Pacing Threshold Pulse Width: 0.4 ms
Lead Channel Pacing Threshold Pulse Width: 0.4 ms
Lead Channel Sensing Intrinsic Amplitude: 1.625 mV
Lead Channel Sensing Intrinsic Amplitude: 1.625 mV
Lead Channel Sensing Intrinsic Amplitude: 7 mV
Lead Channel Sensing Intrinsic Amplitude: 7 mV
Lead Channel Setting Pacing Amplitude: 1.5 V
Lead Channel Setting Pacing Amplitude: 2.5 V
Lead Channel Setting Pacing Pulse Width: 0.4 ms
Lead Channel Setting Sensing Sensitivity: 0.9 mV
Zone Setting Status: 755011
Zone Setting Status: 755011

## 2024-12-10 NOTE — Progress Notes (Signed)
 Remote PPM Transmission

## 2024-12-20 ENCOUNTER — Ambulatory Visit: Payer: Self-pay | Admitting: Cardiovascular Disease

## 2025-02-02 ENCOUNTER — Ambulatory Visit: Payer: Medicare Other | Admitting: Family Medicine

## 2025-03-04 ENCOUNTER — Ambulatory Visit

## 2025-06-03 ENCOUNTER — Ambulatory Visit

## 2025-08-26 ENCOUNTER — Ambulatory Visit

## 2025-09-02 ENCOUNTER — Ambulatory Visit

## 2025-12-02 ENCOUNTER — Ambulatory Visit

## 2026-03-03 ENCOUNTER — Ambulatory Visit
